# Patient Record
Sex: Female | Born: 1970 | Race: White | Hispanic: No | Marital: Married | Smoking: Former smoker
Health system: Southern US, Community
[De-identification: ages and names within clinical notes are randomized; demographics above are authoritative.]

## PROBLEM LIST (undated history)

## (undated) DIAGNOSIS — T8484XA Pain due to internal orthopedic prosthetic devices, implants and grafts, initial encounter: Secondary | ICD-10-CM

## (undated) DIAGNOSIS — F419 Anxiety disorder, unspecified: Secondary | ICD-10-CM

## (undated) DIAGNOSIS — J189 Pneumonia, unspecified organism: Secondary | ICD-10-CM

## (undated) DIAGNOSIS — T4145XA Adverse effect of unspecified anesthetic, initial encounter: Secondary | ICD-10-CM

## (undated) DIAGNOSIS — Z975 Presence of (intrauterine) contraceptive device: Secondary | ICD-10-CM

## (undated) DIAGNOSIS — R7303 Prediabetes: Secondary | ICD-10-CM

## (undated) DIAGNOSIS — R112 Nausea with vomiting, unspecified: Secondary | ICD-10-CM

## (undated) DIAGNOSIS — Z9889 Other specified postprocedural states: Secondary | ICD-10-CM

## (undated) DIAGNOSIS — T8859XA Other complications of anesthesia, initial encounter: Secondary | ICD-10-CM

## (undated) DIAGNOSIS — J45909 Unspecified asthma, uncomplicated: Secondary | ICD-10-CM

## (undated) DIAGNOSIS — I82409 Acute embolism and thrombosis of unspecified deep veins of unspecified lower extremity: Secondary | ICD-10-CM

## (undated) HISTORY — PX: PILONIDAL CYST EXCISION: SHX744

## (undated) HISTORY — PX: BREAST SURGERY: SHX581

---

## 1998-03-13 ENCOUNTER — Other Ambulatory Visit: Admission: RE | Admit: 1998-03-13 | Discharge: 1998-03-13 | Payer: Self-pay | Admitting: *Deleted

## 1999-04-02 ENCOUNTER — Other Ambulatory Visit: Admission: RE | Admit: 1999-04-02 | Discharge: 1999-04-02 | Payer: Self-pay | Admitting: *Deleted

## 2001-04-21 ENCOUNTER — Other Ambulatory Visit: Admission: RE | Admit: 2001-04-21 | Discharge: 2001-04-21 | Payer: Self-pay | Admitting: *Deleted

## 2002-05-02 ENCOUNTER — Other Ambulatory Visit: Admission: RE | Admit: 2002-05-02 | Discharge: 2002-05-02 | Payer: Self-pay | Admitting: *Deleted

## 2002-09-18 ENCOUNTER — Encounter: Payer: Self-pay | Admitting: *Deleted

## 2002-09-18 ENCOUNTER — Encounter: Admission: RE | Admit: 2002-09-18 | Discharge: 2002-09-18 | Payer: Self-pay | Admitting: *Deleted

## 2003-05-11 ENCOUNTER — Other Ambulatory Visit: Admission: RE | Admit: 2003-05-11 | Discharge: 2003-05-11 | Payer: Self-pay | Admitting: *Deleted

## 2004-07-21 ENCOUNTER — Encounter (INDEPENDENT_AMBULATORY_CARE_PROVIDER_SITE_OTHER): Payer: Self-pay | Admitting: *Deleted

## 2004-07-21 ENCOUNTER — Ambulatory Visit (HOSPITAL_BASED_OUTPATIENT_CLINIC_OR_DEPARTMENT_OTHER): Admission: RE | Admit: 2004-07-21 | Discharge: 2004-07-21 | Payer: Self-pay | Admitting: Plastic Surgery

## 2004-07-21 ENCOUNTER — Ambulatory Visit (HOSPITAL_COMMUNITY): Admission: RE | Admit: 2004-07-21 | Discharge: 2004-07-21 | Payer: Self-pay | Admitting: Plastic Surgery

## 2006-08-09 ENCOUNTER — Encounter: Admission: RE | Admit: 2006-08-09 | Discharge: 2006-08-09 | Payer: Self-pay | Admitting: Internal Medicine

## 2008-08-17 ENCOUNTER — Encounter: Admission: RE | Admit: 2008-08-17 | Discharge: 2008-08-17 | Payer: Self-pay | Admitting: Family Medicine

## 2008-09-18 ENCOUNTER — Encounter: Admission: RE | Admit: 2008-09-18 | Discharge: 2008-09-18 | Payer: Self-pay | Admitting: Gastroenterology

## 2008-10-02 ENCOUNTER — Ambulatory Visit (HOSPITAL_COMMUNITY): Admission: RE | Admit: 2008-10-02 | Discharge: 2008-10-02 | Payer: Self-pay | Admitting: Gastroenterology

## 2009-03-05 IMAGING — US US ABDOMEN COMPLETE
1 series · 14 of 25 positions shown · non-contrast
Comparison: None

CLINICAL DATA: Abdominal pain, particularly in the right upper
quadrant

ABDOMEN ULTRASOUND
TECHNIQUE: Complete abdominal ultrasound examination was performed
including evaluation of the liver, gallbladder, bile ducts,
pancreas, kidneys, spleen, IVC, and abdominal aorta.

[Series 1: us abdomen complete · 0.22mm/px · 14 of 63 slices shown]
[im 1/63]
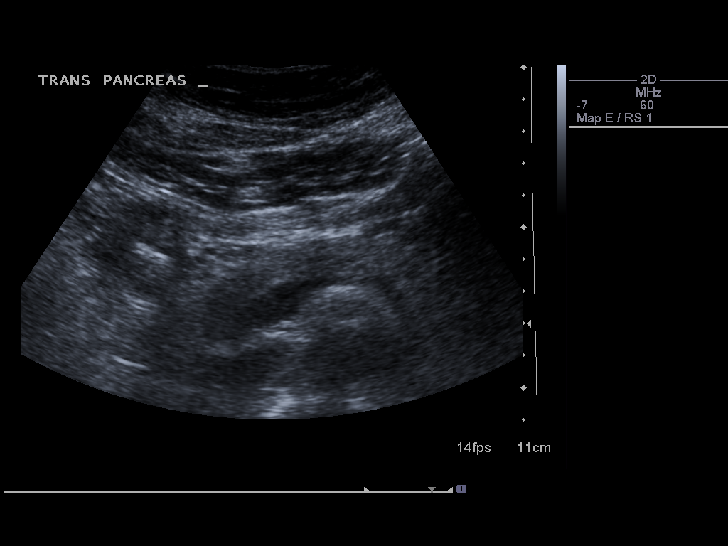
[im 6/63]
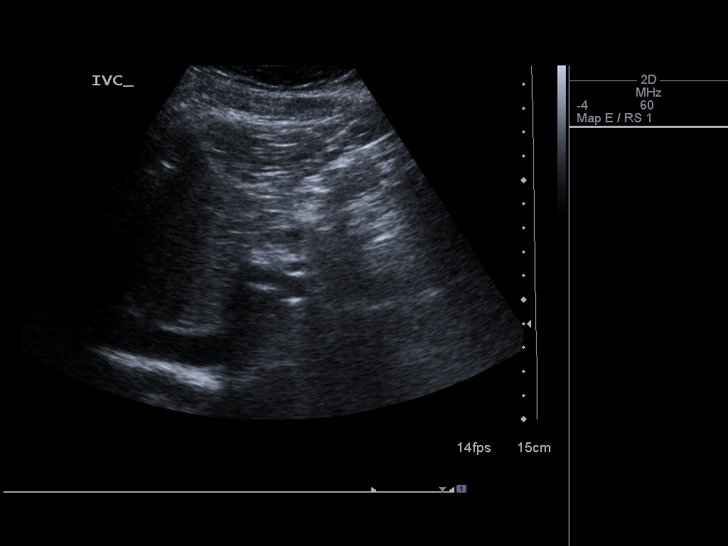
[im 11/63]
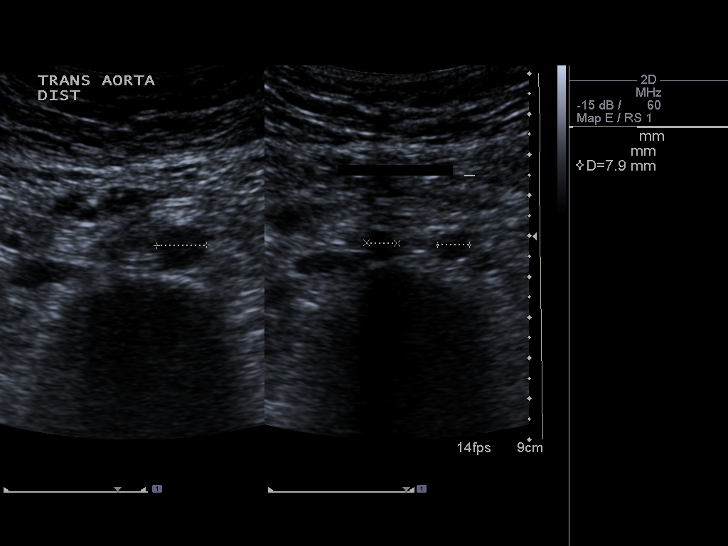
[im 16/63]
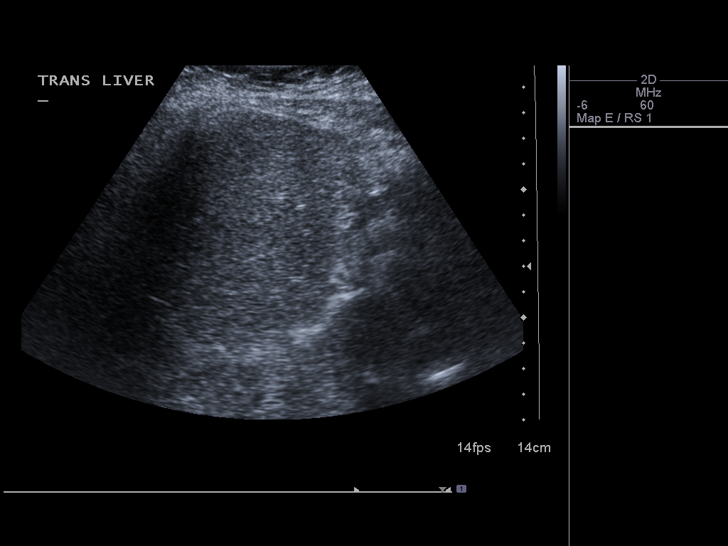
[im 21/63]
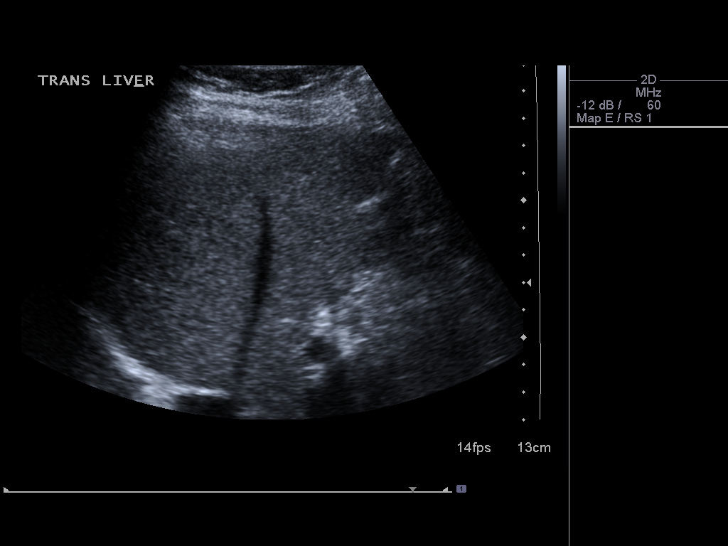
[im 24/63]
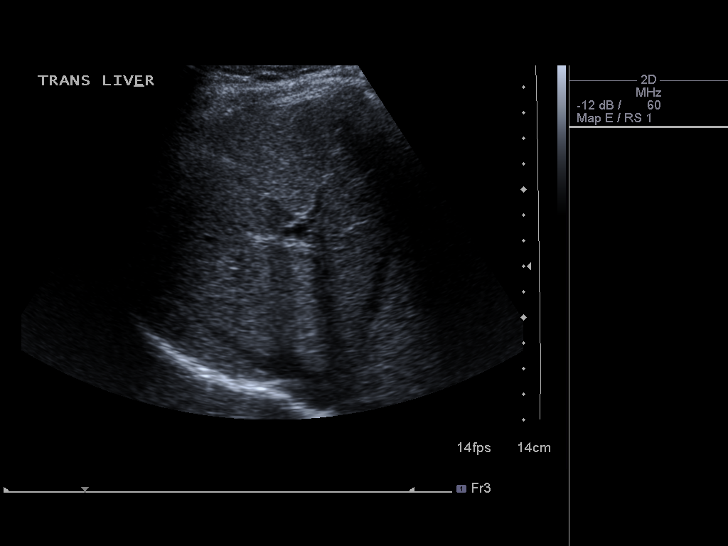
[im 29/63]
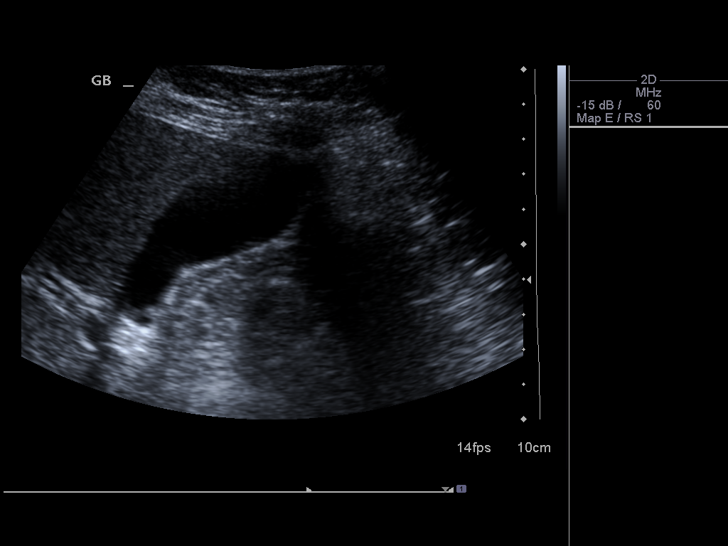
[im 34/63]
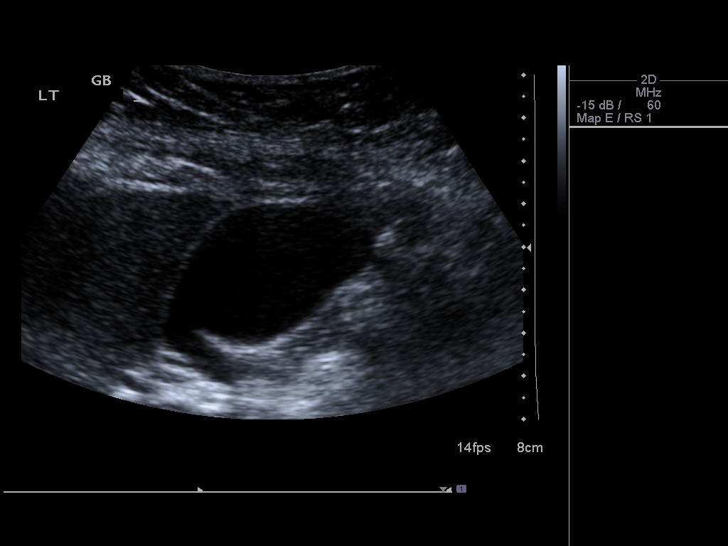
[im 39/63]
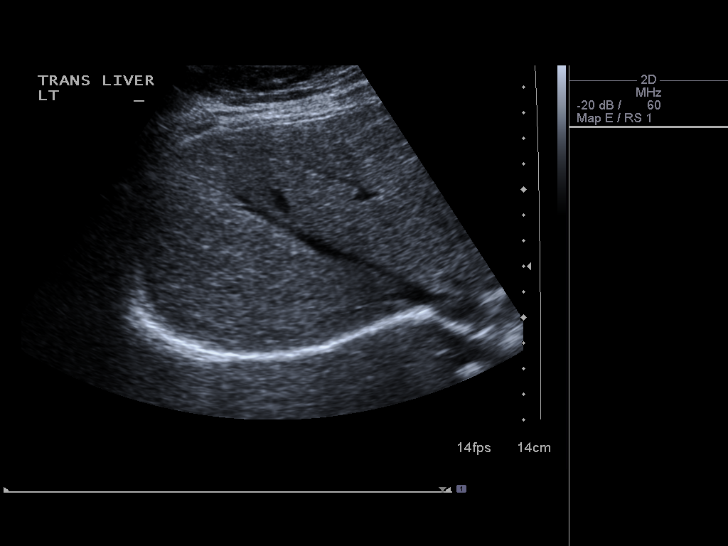
[im 42/63]
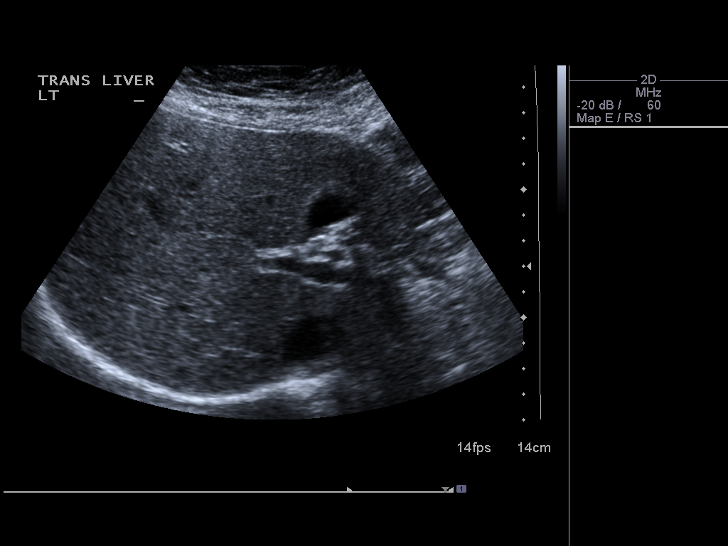
[im 47/63]
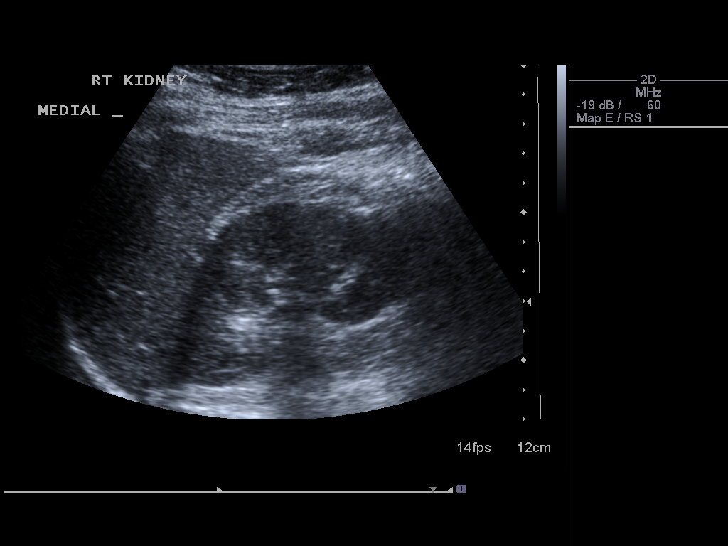
[im 52/63]
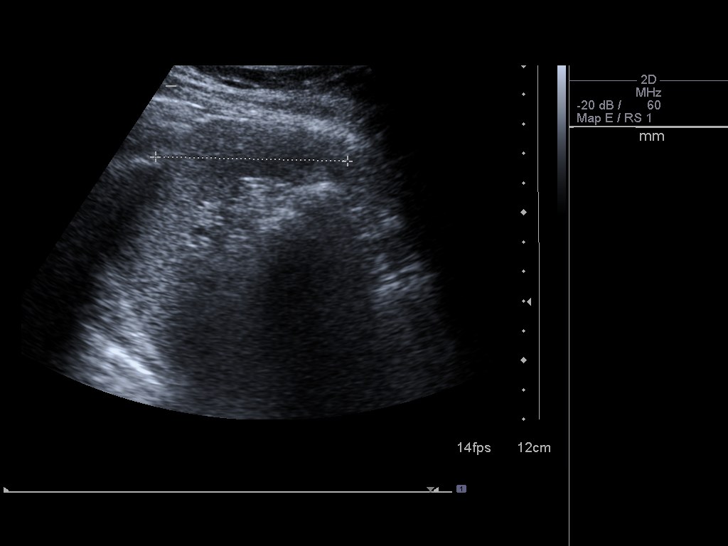
[im 57/63]
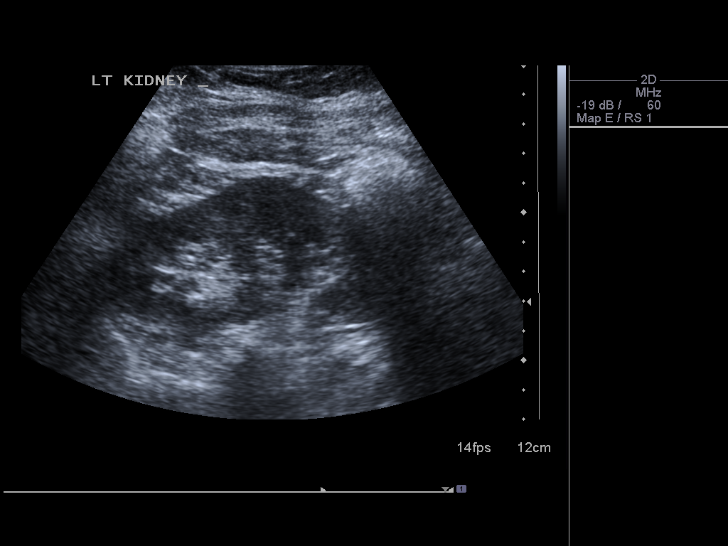
[im 63/63]
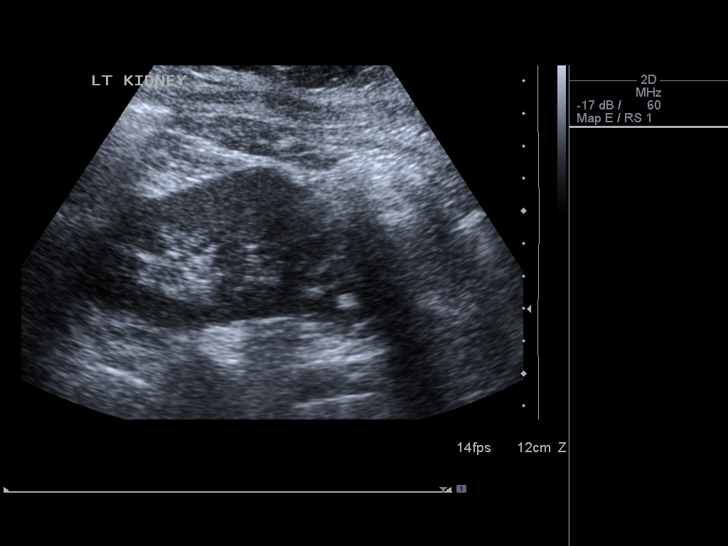

[14 of 25 positions shown; findings below may reference images not displayed]

FINDINGS: The gallbladder is well seen and no gallstones are noted.
The liver has a normal echogenic pattern.  The common bile duct is
normal measuring 3.7 mm in diameter.  The IVC and spleen appear
normal with portions of the head and tail of the pancreas obscured
by bowel gas.  The midportion of the pancreas appears normal and
the pancreatic duct does not appear to be dilated.  No
hydronephrosis is seen.  The right kidney measures 10.3 cm
sagittally, with left kidney measuring 10.2 cm.  A 6 mm echogenic
focus in the lower pole of the left kidney is consistent with a
small renal angiomyolipoma.  The abdominal aorta is normal in
caliber.
IMPRESSION: 1.  No gallstones.  No ductal dilatation.
2.  Portions of the pancreas are obscured by bowel gas.
3.  Small left lower pole renal angiomyolipoma of 6 mm in diameter.

## 2011-03-13 NOTE — Op Note (Signed)
NAMEJASLENE, [name] Carr              ACCOUNT NO.:  [id number]   MEDICAL RECORD NO.:  [id number]          PATIENT TYPE:  AMB   LOCATION:  [facility]                          FACILITY:  [facility]   PHYSICIAN:  Etter Sjogren, M.D.     DATE OF BIRTH:  04/26/1971   DATE OF PROCEDURE:  07/21/2004  DATE OF DISCHARGE:                                 OPERATIVE REPORT   PREOPERATIVE DIAGNOSIS:  Bilateral macromastia with upper back, neck and  shoulder pain.   POSTOPERATIVE DIAGNOSIS:  Bilateral macromastia with upper back, neck and  shoulder pain.   OPERATION PERFORMED:  Bilateral breast reduction.   SURGEON:  Etter Sjogren, M.D.   ANESTHESIA:  General.   ESTIMATED BLOOD LOSS:  125 mL.   INDICATIONS FOR PROCEDURE:  This 40 year old woman has large breasts and  desires reduction.  She does not qualify on her insurance because it was a  very small reduction.  The nature of the procedure, its indications and  risks and possible complications were discussed in great detail with her  including the informed consent form which was discussed line by line.  She  understood those risks and wished to proceed.   DESCRIPTION OF PROCEDURE:  The patient was marked in full standing position  in the holding area for the breast reduction.  She was take to the operating  room and placed supine.  After successful induction with general anesthesia,  she was prepped with Betadine and draped with sterile drapes.  8 cm wide  inferior nipple areolar pedicles were marked and 42 mm marker used to mark  the new nipple areolar complex.  All incisions made and the inferior nipple  areolar pedicle was de-epithelialized.  The inferior nipple areolar pedicles  were isolated from surrounding tissue beveling in an outward direction both  medially and laterally in order to ensure broader attachment at the level of  the chest wall that was present in the skin.  This dissection was performed  with the electrocautery and meticulous  hemostasis maintained with  electrocautery. The resections were performed from medial to central to  lateral.  A total of 222 g from the smaller left side and 344 g from the  larger right side.  This seemed to give her very nice symmetry on  inspection.  Thorough irrigation with saline meticulous hemostasis with  electrocautery.  Closure with 3-0 Monocryl interrupted inverted deep  sutures, 4-0 Monocryl running subcuticular suture.  A measurement was then  taken 5 cm up from the inframammary crease.  A 38 mm marker used to mark the  new sites of the nipple areolar complex.  This tissue was resected.  Nipple  areolar complex was brought up through this opening.  They were both  inspected.  They were found to have very good pink color with bright red  bleeding to periphery consistent with viability.  The nipple areolar  insetting 3-0 Monocryl interrupted inverted deep sutures, 4-0 Monocryl  running  subcuticular suture.  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  She was transported to the recovery room in stable condition  having tolerated the procedure well.       DB/MEDQ  D:  07/21/2004  T:  07/21/2004  Job:  [id number]

## 2012-12-13 ENCOUNTER — Emergency Department (HOSPITAL_BASED_OUTPATIENT_CLINIC_OR_DEPARTMENT_OTHER): Payer: BC Managed Care – PPO

## 2012-12-13 ENCOUNTER — Encounter (HOSPITAL_BASED_OUTPATIENT_CLINIC_OR_DEPARTMENT_OTHER): Payer: Self-pay | Admitting: *Deleted

## 2012-12-13 ENCOUNTER — Emergency Department (HOSPITAL_BASED_OUTPATIENT_CLINIC_OR_DEPARTMENT_OTHER)
Admission: EM | Admit: 2012-12-13 | Discharge: 2012-12-13 | Disposition: A | Discharge status: Home / Self Care | Payer: BC Managed Care – PPO | Attending: Emergency Medicine | Admitting: Emergency Medicine

## 2012-12-13 DIAGNOSIS — R002 Palpitations: Secondary | ICD-10-CM | POA: Insufficient documentation

## 2012-12-13 DIAGNOSIS — M79609 Pain in unspecified limb: Secondary | ICD-10-CM | POA: Insufficient documentation

## 2012-12-13 DIAGNOSIS — Z87891 Personal history of nicotine dependence: Secondary | ICD-10-CM | POA: Insufficient documentation

## 2012-12-13 DIAGNOSIS — F430 Acute stress reaction: Secondary | ICD-10-CM | POA: Insufficient documentation

## 2012-12-13 DIAGNOSIS — Z79899 Other long term (current) drug therapy: Secondary | ICD-10-CM | POA: Insufficient documentation

## 2012-12-13 DIAGNOSIS — R079 Chest pain, unspecified: Secondary | ICD-10-CM | POA: Insufficient documentation

## 2012-12-13 DIAGNOSIS — M79606 Pain in leg, unspecified: Secondary | ICD-10-CM

## 2012-12-13 DIAGNOSIS — M7989 Other specified soft tissue disorders: Secondary | ICD-10-CM | POA: Insufficient documentation

## 2012-12-13 DIAGNOSIS — E119 Type 2 diabetes mellitus without complications: Secondary | ICD-10-CM | POA: Insufficient documentation

## 2012-12-13 DIAGNOSIS — J45901 Unspecified asthma with (acute) exacerbation: Secondary | ICD-10-CM | POA: Insufficient documentation

## 2012-12-13 DIAGNOSIS — F411 Generalized anxiety disorder: Secondary | ICD-10-CM | POA: Insufficient documentation

## 2012-12-13 HISTORY — DX: Anxiety disorder, unspecified: F41.9

## 2012-12-13 HISTORY — DX: Unspecified asthma, uncomplicated: J45.909

## 2012-12-13 MED ORDER — ENOXAPARIN SODIUM 60 MG/0.6ML ~~LOC~~ SOLN
1.0000 mg/kg | Freq: Once | SUBCUTANEOUS | Status: AC
  Administered 2012-12-13: 60 mg via SUBCUTANEOUS
  Filled 2012-12-13: qty 0.6

## 2012-12-13 NOTE — ED Notes (Signed)
Pain behind her left knee for a while. Sob for a while. Feels like she is having heart palpitations. She is being treated for anxiety.

## 2012-12-13 NOTE — ED Provider Notes (Addendum)
History     CSN: [id number]  Arrival date & time 12/13/12  2040   First MD Initiated Contact with Patient 12/13/12 2129      Chief Complaint  Patient presents with  . Leg Pain    (Consider location/radiation/quality/duration/timing/severity/associated sxs/prior treatment) Patient is a 42 y.o. female presenting with leg pain and palpitations. The history is provided by the patient.  Leg Pain Location:  Leg Time since incident:  2 weeks Injury: no   Leg location:  R leg Pain details:    Quality:  Aching   Radiates to:  Does not radiate   Severity:  Mild   Onset quality:  Gradual   Timing:  Intermittent   Progression:  Unchanged Chronicity:  New Worsened by:  Nothing tried Associated symptoms: no fever   Associated symptoms comment:  Has noticed some swelling to the thigh and calf Palpitations  This is a recurrent problem. The current episode started yesterday. The problem occurs daily. The problem has not changed since onset.The problem is associated with anxiety, stress and caffeine. Associated symptoms include leg pain. Pertinent negatives include no fever, no exertional chest pressure, no irregular heartbeat, no abdominal pain, no weakness and no cough. Associated symptoms comments: With running outside will get some chest tightness and wheezing which resolves with inhaler. She has tried breathing exercises for the symptoms. The treatment provided mild relief. Risk factors include diabetes mellitus and stress. Her past medical history does not include anemia or heart disease.    Past Medical History  Diagnosis Date  . Anxiety   . Diabetes mellitus without complication   . Asthma     Past Surgical History  Procedure Laterality Date  . Breast surgery      No family history on file.  History  Substance Use Topics  . Smoking status: Former Games developer  . Smokeless tobacco: Not on file  . Alcohol Use: Yes    OB History   Grav Para Term Preterm Abortions TAB SAB Ect  Mult Living                  Review of Systems  Constitutional: Negative for fever.  Respiratory: Negative for cough.   Cardiovascular: Positive for palpitations.  Gastrointestinal: Negative for abdominal pain.  Neurological: Negative for weakness.  All other systems reviewed and are negative.    Allergies  Latex and Tetracyclines & related  Home Medications   Current Outpatient Rx  Name  Route  Sig  Dispense  Refill  . ALBUTEROL IN   Inhalation   Inhale into the lungs.         Marland Kitchen buPROPion (WELLBUTRIN XL) 150 MG 24 hr tablet   Oral   Take 150 mg by mouth daily.         . busPIRone (BUSPAR) 7.5 MG tablet   Oral   Take 7.5 mg by mouth 3 (three) times daily.         . metFORMIN (GLUCOPHAGE) 1000 MG tablet   Oral   Take 1,000 mg by mouth 2 (two) times daily with a meal.         . Pediatric Multivitamins-Fl (MULTI VIT/FL PO)   Oral   Take by mouth.         . Vitamin D, Ergocalciferol, (DRISDOL) 50000 UNITS CAPS   Oral   Take 50,000 Units by mouth.           BP 146/96  Pulse 123  Temp(Src) 98 F (36.7 C) (Oral)  Resp  24  Wt 134 lb (60.782 kg)  SpO2 100%  LMP 12/06/2012  Physical Exam  Nursing note and vitals reviewed. Constitutional: She is oriented to person, place, and time. She appears well-developed and well-nourished. No distress.  HENT:  Head: Normocephalic and atraumatic.  Mouth/Throat: Oropharynx is clear and moist.  Eyes: Conjunctivae and EOM are normal. Pupils are equal, round, and reactive to light.  Neck: Normal range of motion. Neck supple.  Cardiovascular: Normal rate, regular rhythm and intact distal pulses.   No murmur heard. Pulmonary/Chest: Effort normal and breath sounds normal. No respiratory distress. She has no wheezes. She has no rales.  Abdominal: Soft. She exhibits no distension. There is no tenderness. There is no rebound and no guarding.  Musculoskeletal: Normal range of motion. She exhibits no edema and no  tenderness.  No notable swelling.  2+ DP/PT pulses   Neurological: She is alert and oriented to person, place, and time.  Skin: Skin is warm and dry. No rash noted. No erythema.  Psychiatric: Her behavior is normal. Her mood appears anxious.    ED Course  Procedures (including critical care time)  Labs Reviewed - No data to display Dg Chest 2 View  12/13/2012  *RADIOLOGY REPORT*  Clinical Data: Palpitations  CHEST - 2 VIEW  Comparison: Prior chest x-ray 10/17/2008  Findings: The lungs are well-aerated and free from pulmonary edema, focal airspace consolidation or pulmonary nodule.  Cardiac and mediastinal contours are within normal limits.  No pneumothorax, or pleural effusion. No acute osseous findings.  IMPRESSION:  No acute cardiopulmonary disease.   Original Report Authenticated By: Malachy Moan, M.D.      Date: 12/13/2012  Rate: 95  Rhythm: normal sinus rhythm and sinus arrhythmia  QRS Axis: normal  Intervals: normal  ST/T Wave abnormalities: normal  Conduction Disutrbances:none  Narrative Interpretation:   Old EKG Reviewed: none available    1. Leg pain   2. Palpitation       MDM   Patient is here for 2 separate issues.  She is here for intermittent right leg pain with mild swelling that has been off-and-on for the last 2 weeks. She states it is not painful with running or movement but is painful with sitting. She takes no hormone replacement, no recent surgery or travel however her mom has a history of DVT.  Patient denies any sharp chest pain or shortness of breath.  Per the patient warrants then Doppler ultrasound in the morning. She will be given a dose of Lovenox and follow up for ultrasound.  Secondly patient is complaining of anxiety, stress intermittent palpitations and shortness of breath. She states she has exercise-induced asthma as well as allergies. When she is running and it is cold she starts wheezing when she is finished and requires albuterol which  improves. She denies any pleuritic pain at this time but states she occasionally would just feel congested. She has no prior cardiac history and again does have a history of asthma. Her EKG is within normal limits and shows sinus arrhythmia. X-ray is also unrevealing. Discussed with patient that this is probably a combination of her stress as well as asthma and allergies. She will use her inhaler when necessary and continue to take her antidepressant and antianxiety medication. She'll followup with her doctor for this        Gwyneth Sprout, MD 12/13/12 6295  Gwyneth Sprout, MD 12/13/12 2312

## 2012-12-13 NOTE — ED Notes (Signed)
Pt. Reports high anxiety and stress in her life.  Pt. Talks non stop about her problems.

## 2012-12-14 ENCOUNTER — Ambulatory Visit (HOSPITAL_BASED_OUTPATIENT_CLINIC_OR_DEPARTMENT_OTHER)
Admit: 2012-12-14 | Discharge: 2012-12-14 | Disposition: A | Discharge status: Home / Self Care | Payer: BC Managed Care – PPO | Attending: Emergency Medicine | Admitting: Emergency Medicine

## 2012-12-14 DIAGNOSIS — R209 Unspecified disturbances of skin sensation: Secondary | ICD-10-CM | POA: Insufficient documentation

## 2016-10-26 ENCOUNTER — Emergency Department (HOSPITAL_COMMUNITY): Payer: BLUE CROSS/BLUE SHIELD

## 2016-10-26 ENCOUNTER — Inpatient Hospital Stay (HOSPITAL_COMMUNITY)
Admission: EM | Admit: 2016-10-26 | Discharge: 2016-10-28 | DRG: 502 | Disposition: A | Discharge status: Home / Self Care | Payer: BLUE CROSS/BLUE SHIELD | Attending: Orthopaedic Surgery | Admitting: Orthopaedic Surgery

## 2016-10-26 ENCOUNTER — Encounter (HOSPITAL_COMMUNITY)
Admission: EM | Disposition: A | Discharge status: Home / Self Care | Payer: Self-pay | Source: Home / Self Care | Attending: Orthopaedic Surgery

## 2016-10-26 ENCOUNTER — Encounter (HOSPITAL_COMMUNITY): Payer: Self-pay

## 2016-10-26 ENCOUNTER — Emergency Department (HOSPITAL_COMMUNITY): Payer: BLUE CROSS/BLUE SHIELD | Admitting: Anesthesiology

## 2016-10-26 DIAGNOSIS — E119 Type 2 diabetes mellitus without complications: Secondary | ICD-10-CM | POA: Diagnosis present

## 2016-10-26 DIAGNOSIS — W010XXA Fall on same level from slipping, tripping and stumbling without subsequent striking against object, initial encounter: Secondary | ICD-10-CM | POA: Diagnosis present

## 2016-10-26 DIAGNOSIS — S82202B Unspecified fracture of shaft of left tibia, initial encounter for open fracture type I or II: Secondary | ICD-10-CM | POA: Diagnosis not present

## 2016-10-26 DIAGNOSIS — Z91040 Latex allergy status: Secondary | ICD-10-CM

## 2016-10-26 DIAGNOSIS — S82402A Unspecified fracture of shaft of left fibula, initial encounter for closed fracture: Secondary | ICD-10-CM

## 2016-10-26 DIAGNOSIS — J45909 Unspecified asthma, uncomplicated: Secondary | ICD-10-CM | POA: Diagnosis present

## 2016-10-26 DIAGNOSIS — S82202A Unspecified fracture of shaft of left tibia, initial encounter for closed fracture: Secondary | ICD-10-CM

## 2016-10-26 DIAGNOSIS — S82842B Displaced bimalleolar fracture of left lower leg, initial encounter for open fracture type I or II: Secondary | ICD-10-CM | POA: Diagnosis present

## 2016-10-26 DIAGNOSIS — Z87891 Personal history of nicotine dependence: Secondary | ICD-10-CM | POA: Diagnosis not present

## 2016-10-26 DIAGNOSIS — S82899A Other fracture of unspecified lower leg, initial encounter for closed fracture: Secondary | ICD-10-CM

## 2016-10-26 DIAGNOSIS — F419 Anxiety disorder, unspecified: Secondary | ICD-10-CM | POA: Diagnosis present

## 2016-10-26 DIAGNOSIS — Z888 Allergy status to other drugs, medicaments and biological substances status: Secondary | ICD-10-CM | POA: Diagnosis not present

## 2016-10-26 DIAGNOSIS — S82402B Unspecified fracture of shaft of left fibula, initial encounter for open fracture type I or II: Secondary | ICD-10-CM

## 2016-10-26 HISTORY — PX: EXTERNAL FIXATION LEG: SHX1549

## 2016-10-26 HISTORY — PX: FRACTURE SURGERY: SHX138

## 2016-10-26 HISTORY — PX: IRRIGATION AND DEBRIDEMENT FOOT: SHX6602

## 2016-10-26 LAB — GLUCOSE, CAPILLARY
GLUCOSE-CAPILLARY: 134 mg/dL — AB (ref 65–99)
GLUCOSE-CAPILLARY: 133 mg/dL — AB (ref 65–99)
Glucose-Capillary: 140 mg/dL — ABNORMAL HIGH (ref 65–99)

## 2016-10-26 LAB — CBC WITH DIFFERENTIAL/PLATELET
BASOS PCT: 0 %
Basophils Absolute: 0 10*3/uL (ref 0.0–0.1)
EOS ABS: 0.1 10*3/uL (ref 0.0–0.7)
EOS PCT: 1 %
HCT: 38.2 % (ref 36.0–46.0)
Hemoglobin: 13.1 g/dL (ref 12.0–15.0)
Lymphocytes Relative: 9 %
Lymphs Abs: 1.8 10*3/uL (ref 0.7–4.0)
MCH: 32.1 pg (ref 26.0–34.0)
MCHC: 34.3 g/dL (ref 30.0–36.0)
MCV: 93.6 fL (ref 78.0–100.0)
MONO ABS: 1.5 10*3/uL — AB (ref 0.1–1.0)
MONOS PCT: 8 %
Neutro Abs: 16.3 10*3/uL — ABNORMAL HIGH (ref 1.7–7.7)
Neutrophils Relative %: 82 %
Platelets: 389 10*3/uL (ref 150–400)
RBC: 4.08 MIL/uL (ref 3.87–5.11)
RDW: 13.6 % (ref 11.5–15.5)
WBC: 19.8 10*3/uL — ABNORMAL HIGH (ref 4.0–10.5)

## 2016-10-26 LAB — COMPREHENSIVE METABOLIC PANEL
ALT: 24 U/L (ref 14–54)
AST: 17 U/L (ref 15–41)
Albumin: 4.3 g/dL (ref 3.5–5.0)
Alkaline Phosphatase: 68 U/L (ref 38–126)
Anion gap: 11 (ref 5–15)
BUN: 15 mg/dL (ref 6–20)
CHLORIDE: 104 mmol/L (ref 101–111)
CO2: 23 mmol/L (ref 22–32)
CREATININE: 0.7 mg/dL (ref 0.44–1.00)
Calcium: 8.4 mg/dL — ABNORMAL LOW (ref 8.9–10.3)
Glucose, Bld: 156 mg/dL — ABNORMAL HIGH (ref 65–99)
POTASSIUM: 4.1 mmol/L (ref 3.5–5.1)
Sodium: 138 mmol/L (ref 135–145)
Total Bilirubin: <0.1 mg/dL — ABNORMAL LOW (ref 0.3–1.2)
Total Protein: 7.1 g/dL (ref 6.5–8.1)

## 2016-10-26 LAB — ABO/RH: ABO/RH(D): A POS

## 2016-10-26 LAB — PROTIME-INR
INR: 0.94
PROTHROMBIN TIME: 12.6 s (ref 11.4–15.2)

## 2016-10-26 LAB — TYPE AND SCREEN
ABO/RH(D): A POS
ANTIBODY SCREEN: NEGATIVE

## 2016-10-26 SURGERY — EXTERNAL FIXATION, LOWER EXTREMITY
Anesthesia: General | Site: Ankle | Laterality: Left

## 2016-10-26 MED ORDER — PROPOFOL 10 MG/ML IV BOLUS
INTRAVENOUS | Status: AC
  Filled 2016-10-26: qty 20

## 2016-10-26 MED ORDER — KETAMINE HCL-SODIUM CHLORIDE 100-0.9 MG/10ML-% IV SOSY
0.3000 mg/kg | PREFILLED_SYRINGE | Freq: Once | INTRAVENOUS | Status: AC
Start: 2016-10-26 — End: 2016-10-26
  Administered 2016-10-26: 18 mg via INTRAVENOUS
  Filled 2016-10-26: qty 10

## 2016-10-26 MED ORDER — SODIUM CHLORIDE 0.9 % IR SOLN
Status: DC | PRN
  Administered 2016-10-26: 3000 mL

## 2016-10-26 MED ORDER — HYDROMORPHONE HCL 1 MG/ML IJ SOLN
INTRAMUSCULAR | Status: DC | PRN
  Administered 2016-10-26 (×3): 0.5 mg via INTRAVENOUS

## 2016-10-26 MED ORDER — HYDROMORPHONE HCL 1 MG/ML IJ SOLN
1.0000 mg | INTRAMUSCULAR | Status: DC | PRN
  Administered 2016-10-26 – 2016-10-27 (×3): 1 mg via INTRAVENOUS
  Filled 2016-10-26 (×4): qty 1

## 2016-10-26 MED ORDER — ACETAMINOPHEN 650 MG RE SUPP
650.0000 mg | Freq: Four times a day (QID) | RECTAL | Status: DC | PRN
Start: 2016-10-26 — End: 2016-10-28

## 2016-10-26 MED ORDER — METHOCARBAMOL 500 MG PO TABS
500.0000 mg | ORAL_TABLET | Freq: Four times a day (QID) | ORAL | Status: DC | PRN
  Administered 2016-10-26 – 2016-10-28 (×6): 500 mg via ORAL
  Filled 2016-10-26 (×6): qty 1

## 2016-10-26 MED ORDER — CEFAZOLIN IN D5W 1 GM/50ML IV SOLN
1.0000 g | Freq: Three times a day (TID) | INTRAVENOUS | Status: AC
  Administered 2016-10-26 – 2016-10-28 (×6): 1 g via INTRAVENOUS
  Filled 2016-10-26 (×7): qty 50

## 2016-10-26 MED ORDER — SODIUM CHLORIDE 0.9 % IV BOLUS (SEPSIS)
1000.0000 mL | Freq: Once | INTRAVENOUS | Status: AC
  Administered 2016-10-26: 1000 mL via INTRAVENOUS

## 2016-10-26 MED ORDER — FENTANYL CITRATE (PF) 100 MCG/2ML IJ SOLN
INTRAMUSCULAR | Status: DC | PRN
  Administered 2016-10-26: 100 ug via INTRAVENOUS

## 2016-10-26 MED ORDER — HYDROMORPHONE HCL 1 MG/ML IJ SOLN: 1.0000 mg | Freq: Once | INTRAMUSCULAR | Status: DC

## 2016-10-26 MED ORDER — ONDANSETRON HCL 4 MG/2ML IJ SOLN
INTRAMUSCULAR | Status: DC | PRN
  Administered 2016-10-26: 4 mg via INTRAVENOUS

## 2016-10-26 MED ORDER — CEFAZOLIN SODIUM-DEXTROSE 2-4 GM/100ML-% IV SOLN
2.0000 g | Freq: Once | INTRAVENOUS | Status: AC
  Administered 2016-10-26: 2 g via INTRAVENOUS

## 2016-10-26 MED ORDER — ROPIVACAINE HCL 7.5 MG/ML IJ SOLN
INTRAMUSCULAR | Status: DC | PRN
  Administered 2016-10-26: 20 mL via PERINEURAL
  Administered 2016-10-26: 10 mL via PERINEURAL

## 2016-10-26 MED ORDER — METOCLOPRAMIDE HCL 5 MG/ML IJ SOLN: 5.0000 mg | Freq: Three times a day (TID) | INTRAMUSCULAR | Status: DC | PRN

## 2016-10-26 MED ORDER — ACETAMINOPHEN 325 MG PO TABS: 650.0000 mg | ORAL_TABLET | Freq: Four times a day (QID) | ORAL | Status: DC | PRN

## 2016-10-26 MED ORDER — METHOCARBAMOL 1000 MG/10ML IJ SOLN
500.0000 mg | Freq: Four times a day (QID) | INTRAVENOUS | Status: DC | PRN
  Administered 2016-10-26: 500 mg via INTRAVENOUS
  Filled 2016-10-26: qty 550
  Filled 2016-10-26: qty 5

## 2016-10-26 MED ORDER — ONDANSETRON HCL 4 MG/2ML IJ SOLN
INTRAMUSCULAR | Status: AC
  Filled 2016-10-26: qty 2

## 2016-10-26 MED ORDER — PROPOFOL 10 MG/ML IV BOLUS
INTRAVENOUS | Status: DC | PRN
  Administered 2016-10-26: 150 mg via INTRAVENOUS

## 2016-10-26 MED ORDER — FENTANYL CITRATE (PF) 100 MCG/2ML IJ SOLN
INTRAMUSCULAR | Status: AC
  Filled 2016-10-26: qty 2

## 2016-10-26 MED ORDER — BUPIVACAINE HCL (PF) 0.5 % IJ SOLN
INTRAMUSCULAR | Status: DC | PRN
  Administered 2016-10-26: 5 mL via PERINEURAL
  Administered 2016-10-26: 10 mL via PERINEURAL

## 2016-10-26 MED ORDER — SUCCINYLCHOLINE CHLORIDE 200 MG/10ML IV SOSY
PREFILLED_SYRINGE | INTRAVENOUS | Status: AC
Start: 2016-10-26 — End: 2016-10-26
  Filled 2016-10-26: qty 10

## 2016-10-26 MED ORDER — ALPRAZOLAM 0.5 MG PO TABS: 0.5000 mg | ORAL_TABLET | Freq: Two times a day (BID) | ORAL | Status: DC | PRN

## 2016-10-26 MED ORDER — DIPHENHYDRAMINE HCL 12.5 MG/5ML PO ELIX: 12.5000 mg | ORAL_SOLUTION | ORAL | Status: DC | PRN

## 2016-10-26 MED ORDER — ONDANSETRON HCL 4 MG PO TABS: 4.0000 mg | ORAL_TABLET | Freq: Four times a day (QID) | ORAL | Status: DC | PRN

## 2016-10-26 MED ORDER — CITALOPRAM HYDROBROMIDE 20 MG PO TABS
20.0000 mg | ORAL_TABLET | Freq: Every day | ORAL | Status: DC
  Administered 2016-10-26 – 2016-10-27 (×2): 20 mg via ORAL
  Filled 2016-10-26 (×4): qty 1

## 2016-10-26 MED ORDER — MIDAZOLAM HCL 2 MG/2ML IJ SOLN
INTRAMUSCULAR | Status: AC
Start: 2016-10-26 — End: 2016-10-26
  Filled 2016-10-26: qty 2

## 2016-10-26 MED ORDER — HYDROMORPHONE HCL 2 MG/ML IJ SOLN
INTRAMUSCULAR | Status: AC
  Filled 2016-10-26: qty 1

## 2016-10-26 MED ORDER — LIDOCAINE 2% (20 MG/ML) 5 ML SYRINGE
INTRAMUSCULAR | Status: DC | PRN
  Administered 2016-10-26: 50 mg via INTRAVENOUS

## 2016-10-26 MED ORDER — ONDANSETRON HCL 4 MG/2ML IJ SOLN: 4.0000 mg | Freq: Four times a day (QID) | INTRAMUSCULAR | Status: DC | PRN

## 2016-10-26 MED ORDER — SODIUM CHLORIDE 0.9 % IV SOLN
INTRAVENOUS | Status: DC
  Administered 2016-10-26 – 2016-10-27 (×2): via INTRAVENOUS

## 2016-10-26 MED ORDER — MIDAZOLAM HCL 5 MG/5ML IJ SOLN
INTRAMUSCULAR | Status: DC | PRN
  Administered 2016-10-26: 2 mg via INTRAVENOUS

## 2016-10-26 MED ORDER — BUPIVACAINE HCL (PF) 0.5 % IJ SOLN
INTRAMUSCULAR | Status: AC
  Filled 2016-10-26: qty 30

## 2016-10-26 MED ORDER — 0.9 % SODIUM CHLORIDE (POUR BTL) OPTIME
TOPICAL | Status: DC | PRN
  Administered 2016-10-26: 1000 mL

## 2016-10-26 MED ORDER — SUCCINYLCHOLINE CHLORIDE 200 MG/10ML IV SOSY
PREFILLED_SYRINGE | INTRAVENOUS | Status: DC | PRN
  Administered 2016-10-26: 120 mg via INTRAVENOUS

## 2016-10-26 MED ORDER — KETOROLAC TROMETHAMINE 15 MG/ML IJ SOLN
7.5000 mg | Freq: Four times a day (QID) | INTRAMUSCULAR | Status: AC
  Administered 2016-10-26 – 2016-10-27 (×4): 7.5 mg via INTRAVENOUS
  Filled 2016-10-26 (×4): qty 1

## 2016-10-26 MED ORDER — OXYCODONE HCL 5 MG PO TABS: 5.0000 mg | ORAL_TABLET | Freq: Once | ORAL | Status: DC | PRN

## 2016-10-26 MED ORDER — HYDROCODONE-ACETAMINOPHEN 5-325 MG PO TABS
1.0000 | ORAL_TABLET | ORAL | Status: DC | PRN
  Administered 2016-10-26 – 2016-10-28 (×10): 2 via ORAL
  Filled 2016-10-26 (×10): qty 2

## 2016-10-26 MED ORDER — FENTANYL CITRATE (PF) 100 MCG/2ML IJ SOLN
50.0000 ug | Freq: Once | INTRAMUSCULAR | Status: AC
Start: 2016-10-26 — End: 2016-10-26
  Administered 2016-10-26: 50 ug via INTRAVENOUS
  Filled 2016-10-26: qty 2

## 2016-10-26 MED ORDER — ROPIVACAINE HCL 7.5 MG/ML IJ SOLN
INTRAMUSCULAR | Status: AC
  Filled 2016-10-26: qty 40

## 2016-10-26 MED ORDER — OXYCODONE HCL 5 MG/5ML PO SOLN: 5.0000 mg | Freq: Once | ORAL | Status: DC | PRN

## 2016-10-26 MED ORDER — CEFAZOLIN SODIUM-DEXTROSE 2-4 GM/100ML-% IV SOLN
INTRAVENOUS | Status: AC
  Filled 2016-10-26: qty 100

## 2016-10-26 MED ORDER — HYDROMORPHONE HCL 1 MG/ML IJ SOLN: 0.2500 mg | INTRAMUSCULAR | Status: DC | PRN

## 2016-10-26 MED ORDER — MONTELUKAST SODIUM 10 MG PO TABS
10.0000 mg | ORAL_TABLET | Freq: Every day | ORAL | Status: DC
  Administered 2016-10-26 – 2016-10-27 (×2): 10 mg via ORAL
  Filled 2016-10-26 (×2): qty 1

## 2016-10-26 MED ORDER — METOCLOPRAMIDE HCL 5 MG PO TABS: 5.0000 mg | ORAL_TABLET | Freq: Three times a day (TID) | ORAL | Status: DC | PRN

## 2016-10-26 MED ORDER — LIDOCAINE 2% (20 MG/ML) 5 ML SYRINGE
INTRAMUSCULAR | Status: AC
  Filled 2016-10-26: qty 5

## 2016-10-26 MED ORDER — DOCUSATE SODIUM 100 MG PO CAPS
100.0000 mg | ORAL_CAPSULE | Freq: Two times a day (BID) | ORAL | Status: DC
  Administered 2016-10-26 – 2016-10-28 (×4): 100 mg via ORAL
  Filled 2016-10-26 (×4): qty 1

## 2016-10-26 MED ORDER — LACTATED RINGERS IV SOLN
INTRAVENOUS | Status: DC | PRN
  Administered 2016-10-26: 08:00:00 via INTRAVENOUS

## 2016-10-26 MED ORDER — OXYCODONE HCL 5 MG PO TABS
5.0000 mg | ORAL_TABLET | ORAL | Status: DC | PRN
  Administered 2016-10-27 – 2016-10-28 (×6): 10 mg via ORAL
  Filled 2016-10-26 (×6): qty 2

## 2016-10-26 SURGICAL SUPPLY — 43 items
BAG SPEC THK2 15X12 ZIP CLS (MISCELLANEOUS) ×2
BAG ZIPLOCK 12X15 (MISCELLANEOUS) ×4 IMPLANT
BANDAGE ACE 4X5 VEL STRL LF (GAUZE/BANDAGES/DRESSINGS) ×3 IMPLANT
BANDAGE ESMARK 6X9 LF (GAUZE/BANDAGES/DRESSINGS) ×2 IMPLANT
BAR EXFX 350X11 NS LF (EXFIX) ×4
BAR GLASS FIBER EXFX 11X350 (EXFIX) ×6 IMPLANT
BNDG CMPR 9X6 STRL LF SNTH (GAUZE/BANDAGES/DRESSINGS) ×2
BNDG ESMARK 6X9 LF (GAUZE/BANDAGES/DRESSINGS) ×4
BNDG GAUZE ELAST 4 BULKY (GAUZE/BANDAGES/DRESSINGS) ×4 IMPLANT
CLAMP BLUE BAR TO PIN (EXFIX) ×6 IMPLANT
CUFF TOURN SGL QUICK 18 (TOURNIQUET CUFF) IMPLANT
CUFF TOURN SGL QUICK 24 (TOURNIQUET CUFF)
CUFF TOURN SGL QUICK 34 (TOURNIQUET CUFF)
CUFF TRNQT CYL 24X4X40X1 (TOURNIQUET CUFF) IMPLANT
CUFF TRNQT CYL 34X4X40X1 (TOURNIQUET CUFF) IMPLANT
DRAIN PENROSE 18X1/2 LTX STRL (DRAIN) ×4 IMPLANT
DRSG PAD ABDOMINAL 8X10 ST (GAUZE/BANDAGES/DRESSINGS) ×8 IMPLANT
DURAPREP 26ML APPLICATOR (WOUND CARE) ×4 IMPLANT
ELECT REM PT RETURN 9FT ADLT (ELECTROSURGICAL) ×4
ELECTRODE REM PT RTRN 9FT ADLT (ELECTROSURGICAL) ×2 IMPLANT
GAUZE SPONGE 4X4 12PLY STRL (GAUZE/BANDAGES/DRESSINGS) ×4 IMPLANT
GAUZE XEROFORM 5X9 LF (GAUZE/BANDAGES/DRESSINGS) ×3 IMPLANT
GLOVE BIO SURGEON STRL SZ7.5 (GLOVE) ×4 IMPLANT
GLOVE BIOGEL PI IND STRL 8 (GLOVE) ×2 IMPLANT
GLOVE BIOGEL PI INDICATOR 8 (GLOVE) ×2
GLOVE ECLIPSE 8.0 STRL XLNG CF (GLOVE) ×4 IMPLANT
GOWN STRL REUS W/TWL XL LVL3 (GOWN DISPOSABLE) ×4 IMPLANT
HANDPIECE INTERPULSE COAX TIP (DISPOSABLE) ×4
KIT BASIN OR (CUSTOM PROCEDURE TRAY) ×4 IMPLANT
PACK ORTHO EXTREMITY (CUSTOM PROCEDURE TRAY) ×4 IMPLANT
PAD ABD 8X10 STRL (GAUZE/BANDAGES/DRESSINGS) ×3 IMPLANT
PAD CAST 4YDX4 CTTN HI CHSV (CAST SUPPLIES) ×2 IMPLANT
PADDING CAST ABS 4INX4YD NS (CAST SUPPLIES) ×4
PADDING CAST ABS COTTON 4X4 ST (CAST SUPPLIES) ×2 IMPLANT
PADDING CAST COTTON 4X4 STRL (CAST SUPPLIES) ×4
PIN CLAMP 2BAR 75MM BLUE (EXFIX) ×3 IMPLANT
PIN HALF YELLOW 5X160X35 (EXFIX) ×6 IMPLANT
PIN TRANSFIXING 5.0 (EXFIX) ×3 IMPLANT
POSITIONER SURGICAL ARM (MISCELLANEOUS) ×4 IMPLANT
SET HNDPC FAN SPRY TIP SCT (DISPOSABLE) ×2 IMPLANT
SUT ETHILON 2 0 PS N (SUTURE) ×6 IMPLANT
SYR CONTROL 10ML LL (SYRINGE) ×4 IMPLANT
TOWEL OR 17X26 10 PK STRL BLUE (TOWEL DISPOSABLE) ×8 IMPLANT

## 2016-10-26 NOTE — Anesthesia Preprocedure Evaluation (Signed)
Anesthesia Evaluation  Patient identified by MRN, date of birth, ID band Patient awake    Reviewed: Allergy & Precautions, NPO status , Patient's Chart, lab work & pertinent test results  History of Anesthesia Complications Negative for: history of anesthetic complications  Airway Mallampati: II  TM Distance: >3 FB Neck ROM: Full    Dental  (+) Teeth Intact   Pulmonary asthma , former smoker,    breath sounds clear to auscultation       Cardiovascular  Rhythm:Regular     Neuro/Psych PSYCHIATRIC DISORDERS Anxiety negative neurological ROS     GI/Hepatic negative GI ROS, Neg liver ROS,   Endo/Other  diabetes  Renal/GU      Musculoskeletal Left open ankle fracture   Abdominal   Peds  Hematology negative hematology ROS (+)   Anesthesia Other Findings   Reproductive/Obstetrics                             Anesthesia Physical Anesthesia Plan  ASA: II  Anesthesia Plan: General   Post-op Pain Management:  Regional for Post-op pain   Induction: Intravenous and Rapid sequence  Airway Management Planned: Oral ETT  Additional Equipment: None  Intra-op Plan:   Post-operative Plan: Extubation in OR  Informed Consent: I have reviewed the patients History and Physical, chart, labs and discussed the procedure including the risks, benefits and alternatives for the proposed anesthesia with the patient or authorized representative who has indicated his/her understanding and acceptance.   Dental advisory given  Plan Discussed with: CRNA and Surgeon  Anesthesia Plan Comments:         Anesthesia Quick Evaluation

## 2016-10-26 NOTE — H&P (Signed)
[name] Carr is an 46 y.o. female.   Chief Complaint: left ankle pain; known open ankle fracture HPI:   46 yo female was accidentally stepped off of a curb wrong sustaining an acute injury to her left ankle.  Was brought to the ED with an obvious left ankle injury and was noted to have an open comminuted distal tibia and fibula fracture confirm by x-rays that I have reviewed.  She has been drinking alcohol and was returning from a party when this happened.  She reports severe left ankle pain and denies other injuries.  Past Medical History:  Diagnosis Date  . Anxiety   . Asthma   . Diabetes mellitus without complication [facility])     Past Surgical History:  Procedure Laterality Date  . BREAST SURGERY      History reviewed. No pertinent family history. Social History:  reports that she has quit smoking. She has never used smokeless tobacco. She reports that she drinks alcohol. She reports that she does not use drugs.  Allergies:  Allergies  Allergen Reactions  . Tetracyclines & Related Itching  . Latex Itching and Rash     (Not in a hospital admission)  Results for orders placed or performed during the hospital encounter of 10/26/16 (from the past 48 hour(s))  CBC with Differential     Status: Abnormal   Collection Time: 10/26/16  5:58 AM  Result Value Ref Range   WBC 19.8 (H) 4.0 - 10.5 K/uL   RBC 4.08 3.87 - 5.11 MIL/uL   Hemoglobin 13.1 12.0 - 15.0 g/dL   HCT 38.2 36.0 - 46.0 %   MCV 93.6 78.0 - 100.0 fL   MCH 32.1 26.0 - 34.0 pg   MCHC 34.3 30.0 - 36.0 g/dL   RDW 13.6 11.5 - 15.5 %   Platelets 389 150 - 400 K/uL   Neutrophils Relative % 82 %   Neutro Abs 16.3 (H) 1.7 - 7.7 K/uL   Lymphocytes Relative 9 %   Lymphs Abs 1.8 0.7 - 4.0 K/uL   Monocytes Relative 8 %   Monocytes Absolute 1.5 (H) 0.1 - 1.0 K/uL   Eosinophils Relative 1 %   Eosinophils Absolute 0.1 0.0 - 0.7 K/uL   Basophils Relative 0 %   Basophils Absolute 0.0 0.0 - 0.1 K/uL  Protime-INR     Status:  None   Collection Time: 10/26/16  5:58 AM  Result Value Ref Range   Prothrombin Time 12.6 11.4 - 15.2 seconds   INR 0.94   Comprehensive metabolic panel     Status: Abnormal   Collection Time: 10/26/16  5:58 AM  Result Value Ref Range   Sodium 138 135 - 145 mmol/L   Potassium 4.1 3.5 - 5.1 mmol/L   Chloride 104 101 - 111 mmol/L   CO2 23 22 - 32 mmol/L   Glucose, Bld 156 (H) 65 - 99 mg/dL   BUN 15 6 - 20 mg/dL   Creatinine, Ser 0.70 0.44 - 1.00 mg/dL   Calcium 8.4 (L) 8.9 - 10.3 mg/dL   Total Protein 7.1 6.5 - 8.1 g/dL   Albumin 4.3 3.5 - 5.0 g/dL   AST 17 15 - 41 U/L   ALT 24 14 - 54 U/L   Alkaline Phosphatase 68 38 - 126 U/L   Total Bilirubin <0.1 (L) 0.3 - 1.2 mg/dL   GFR calc non Af Amer >60 >60 mL/min   GFR calc Af Amer >60 >60 mL/min    Comment: (NOTE) The  eGFR has been calculated using the CKD EPI equation. This calculation has not been validated in all clinical situations. eGFR's persistently <60 mL/min signify possible Chronic Kidney Disease.    Anion gap 11 5 - 15   Dg Ankle Complete Left  Result Date: 10/26/2016 CLINICAL DATA:  Obvious deformity after falling tonight. EXAM: LEFT ANKLE COMPLETE - 3+ VIEW COMPARISON:  None. FINDINGS: There are severely comminuted fractures of the distal tibia and fibula with multiple displaced fragments at the fracture lines. The major tibial fracture components are displaced posteriorly a full shaft width. The major fibular fracture fragments are displaced posteriorly 1-2 shaft widths. Disruption of the soft tissues over the tibial fracture. No dislocation. IMPRESSION: Severely comminuted fractures of the distal tibia and fibula with scratch posterior displacement. Electronically Signed   By: Andreas Newport M.D.   On: 10/26/2016 06:48   Dg Foot 2 Views Left  Result Date: 10/26/2016 CLINICAL DATA:  Obvious deformity after falling tonight. EXAM: LEFT FOOT - 2 VIEW COMPARISON:  None. FINDINGS: Comminuted fractures of the distal tibia and  fibula, described in greater detail on the accompanying ankle radiographs. The bones and articulations of the foot are intact. Question soft tissue lacerations at the dorsum of the foot, and there may be a few small foreign particles within the soft tissues dorsal to the talus, navicular and cuneiforms. IMPRESSION: Distal tib-fib fractures. Bones and articulations of the foot are intact. Question foreign material in the soft tissues of the dorsum of the foot. Electronically Signed   By: Andreas Newport M.D.   On: 10/26/2016 06:46    Review of Systems  All other systems reviewed and are negative.   Blood pressure 135/87, pulse 89, temperature 97.7 F (36.5 C), temperature source Oral, resp. rate 18, height 5' 5"  (1.651 m), weight 134 lb (60.8 kg), last menstrual period 10/26/2016, SpO2 100 %. Physical Exam  Constitutional: She is oriented to person, place, and time. She appears well-developed and well-nourished.  HENT:  Head: Normocephalic and atraumatic.  Eyes: EOM are normal. Pupils are equal, round, and reactive to light.  Neck: Normal range of motion. Neck supple.  Cardiovascular: Normal rate and regular rhythm.   Respiratory: Effort normal and breath sounds normal.  GI: Soft. Bowel sounds are normal.  Musculoskeletal:       Left ankle: She exhibits decreased range of motion, swelling, ecchymosis, deformity and laceration. Tenderness. Lateral malleolus and medial malleolus tenderness found.       Feet:  Neurological: She is alert and oriented to person, place, and time.  Skin: Skin is warm and dry.  Psychiatric: She has a normal mood and affect.     Assessment/Plan This is a 46 year old with a comminuted left grade 1A open distal tibia and fibula fracture post a mechanical fall 1)  Will need to proceed urgently to the OR this am for an I&D of her open ankle wound and external fixation to temporarily align the bones to allow for soft-tissue recovery.  She understands this and the  risks and benefits of surgery have been discussed in detail.  Will then be admitted as an inpatient for IV antibiotics and a CT scan of the ankle.  Definitive surgery will be at a later date once the soft-tissue swelling has resolved.  Mcarthur Rossetti, MD 10/26/2016, 7:27 AM

## 2016-10-26 NOTE — Brief Op Note (Signed)
10/26/2016  10:01 AM  PATIENT:  [name] Carr  46 y.o. female  PRE-OPERATIVE DIAGNOSIS:  Left open distal tibia/fibula fracture  POST-OPERATIVE DIAGNOSIS:  Left open Grade 1A distal tibia/fibula fracture  PROCEDURE:  Procedure(s): EXTERNAL FIXATION ANKLE  (Left) IRRIGATION AND DEBRIDEMENT (Left)  SURGEON:  Surgeon(s) and Role:    * Mcarthur Rossetti, MD - Primary  PHYSICIAN ASSISTANT: Benita Stabile, PA-C  ANESTHESIA:   regional and general  EBL: < 50 cc  COUNTS:  YES  TOURNIQUET:  * No tourniquets in log *  DICTATION: .Other Dictation: Dictation Number [id number]  PLAN OF CARE: Admit to inpatient   PATIENT DISPOSITION:  PACU - hemodynamically stable.   Delay start of Pharmacological VTE agent (>24hrs) due to surgical blood loss or risk of bleeding: no

## 2016-10-26 NOTE — Transfer of Care (Signed)
Immediate Anesthesia Transfer of Care Note  Patient: [name] Carr  Procedure(s) Performed: Procedure(s): EXTERNAL FIXATION ANKLE  (Left) IRRIGATION AND DEBRIDEMENT (Left)  Patient Location: PACU  Anesthesia Type:General  Level of Consciousness:  sedated, patient cooperative and responds to stimulation  Airway & Oxygen Therapy:Patient Spontanous Breathing and Patient connected to face mask oxgen  Post-op Assessment:  Report given to PACU RN and Post -op Vital signs reviewed and stable  Post vital signs:  Reviewed and stable  Last Vitals:  Vitals:   10/26/16 0656 10/26/16 0730  BP: 135/87 126/83  Pulse: 89 94  Resp: 18 17  Temp:      Complications: No apparent anesthesia complications

## 2016-10-26 NOTE — Evaluation (Signed)
Physical Therapy Evaluation Patient Details Name: [name] Carr MRN: [id number] DOB: [birth year] Today's Date: 10/26/2016   History of Present Illness  46 yo female was accidentally stepped off of a curb wrong sustaining an acute injury to her left ankle.  Was brought to the ED with an obvious left ankle injury and was noted to have an open comminuted distal tibia and fibula fracture confirm by x-rays; She had been drinking alcohol and was returning from a party when this happened; s/p external fixation of L tib/fib fx  Clinical Impression  Pt admitted with above diagnosis. Pt currently with functional limitations due to the deficits listed below (see PT Problem List).  Pt will benefit from skilled PT to increase their independence and safety with mobility to allow discharge to the venue listed below.   Pt did well today, will see again in am for further gait and stair training     Follow Up Recommendations No PT follow up    Equipment Recommendations  Rolling walker with 5" wheels (may need w/c and or crutches -tbd)    Recommendations for Other Services       Precautions / Restrictions Precautions Precautions: Fall Restrictions Weight Bearing Restrictions: Yes LLE Weight Bearing: Non weight bearing      Mobility  Bed Mobility Overal bed mobility: Needs Assistance Bed Mobility: Supine to Sit;Sit to Supine     Supine to sit: Supervision Sit to supine: Supervision   General bed mobility comments: cues for LLE management, elevated on return to bed  Transfers Overall transfer level: Needs assistance Equipment used: Rolling walker (2 wheeled) Transfers: Sit to/from Stand Sit to Stand: Min assist;Min guard         General transfer comment: cues for hand placement  Ambulation/Gait Ambulation/Gait assistance: Min assist   Assistive device: Rolling walker (2 wheeled)       General Gait Details: cues for NWB, RW position, LLE position  Stairs             Wheelchair Mobility    Modified Rankin (Stroke Patients Only)       Balance Overall balance assessment: Needs assistance   Sitting balance-Leahy Scale: Good       Standing balance-Leahy Scale: Fair Standing balance comment: fair to poor, needs at least unilateral UE assist for safe balance                             Pertinent Vitals/Pain Pain Assessment: 0-10 Pain Location: L lower leg Pain Descriptors / Indicators: Aching;Sore Pain Intervention(s): Limited activity within patient's tolerance;Monitored during session;Patient requesting pain meds-RN notified    Home Living Family/patient expects to be discharged to:: Private residence Living Arrangements: Alone;Parent (lives alone, going to her parents) Available Help at Discharge: Family Type of Home: House Home Access: Stairs to enter   Technical brewer of Steps: 2 Home Layout: Able to live on main level with bedroom/bathroom Home Equipment: None      Prior Function Level of Independence: Independent               Hand Dominance        Extremity/Trunk Assessment   Upper Extremity Assessment Upper Extremity Assessment: Defer to OT evaluation    Lower Extremity Assessment Lower Extremity Assessment: LLE deficits/detail LLE Deficits / Details: pt is unable to move toes, absent sesation to light touch (had adductor block); she is able to move knee through near full AROM LLE: Unable to fully  assess due to immobilization       Communication   Communication: No difficulties  Cognition Arousal/Alertness: Awake/alert Behavior During Therapy: WFL for tasks assessed/performed Overall Cognitive Status: Within Functional Limits for tasks assessed                      General Comments      Exercises     Assessment/Plan    PT Assessment Patient needs continued PT services  PT Problem List Decreased strength;Decreased range of motion;Decreased activity tolerance;Decreased  mobility;Decreased knowledge of use of DME          PT Treatment Interventions DME instruction;Functional mobility training;Gait training;Therapeutic activities;Stair training;Patient/family education    PT Goals (Current goals can be found in the Care Plan section)  Acute Rehab PT Goals Patient Stated Goal: home to parents PT Goal Formulation: With patient Time For Goal Achievement: 10/29/16 Potential to Achieve Goals: Good    Frequency Min 3X/week   Barriers to discharge        Co-evaluation               End of Session   Activity Tolerance: Patient tolerated treatment well Patient left: with call bell/phone within reach;in bed;with bed alarm set;with family/visitor present           Time: 1430-1452 PT Time Calculation (min) (ACUTE ONLY): 22 min   Charges:   PT Evaluation $PT Eval Low Complexity: 1 Procedure     PT G Codes:        [name] Carr November 17, 2016, 3:46 PM

## 2016-10-26 NOTE — Anesthesia Postprocedure Evaluation (Addendum)
Anesthesia Post Note  Patient: [name] Carr  Procedure(s) Performed: Procedure(s) (LRB): EXTERNAL FIXATION ANKLE  (Left) IRRIGATION AND DEBRIDEMENT (Left)  Patient location during evaluation: PACU Anesthesia Type: General and Regional Level of consciousness: awake and alert Pain management: pain level controlled Vital Signs Assessment: post-procedure vital signs reviewed and stable Respiratory status: spontaneous breathing, respiratory function stable and patient connected to nasal cannula oxygen Cardiovascular status: blood pressure returned to baseline and stable Postop Assessment: no signs of nausea or vomiting Anesthetic complications: no       Last Vitals:  Vitals:   10/26/16 1100 10/26/16 1115  BP: 138/76 (!) 143/85  Pulse: 100 (!) 106  Resp: 13 16  Temp: 36.7 C 36.8 C    Last Pain:  Vitals:   10/26/16 1115  TempSrc:   PainSc: 0-No pain                 Kumiko Fishman

## 2016-10-26 NOTE — ED Triage Notes (Signed)
Fall/trip and hurt left leg/ankle with deformity noted bleeding controlled at this time.

## 2016-10-26 NOTE — Anesthesia Procedure Notes (Signed)
Anesthesia Regional Block:  Adductor canal block  Pre-Anesthetic Checklist: ,, timeout performed, Correct Patient, Correct Site, Correct Laterality, Correct Procedure, Correct Position, site marked, Risks and benefits discussed,  Surgical consent,  Pre-op evaluation,  At surgeon's request and post-op pain management  Laterality: Lower and Left  Prep: chloraprep       Needles:  Injection technique: Single-shot  Needle Type: Echogenic Stimulator Needle          Additional Needles:  Procedures: ultrasound guided (picture in chart) Adductor canal block Narrative:  Injection made incrementally with aspirations every 5 mL.  Performed by: Personally  Anesthesiologist: Mutasim Tuckey  Additional Notes: H+P and labs reviewed, risks and benefits discussed with patient, procedure tolerated well without complications      

## 2016-10-26 NOTE — Anesthesia Procedure Notes (Signed)
Anesthesia Regional Block:  Popliteal block  Pre-Anesthetic Checklist: ,, timeout performed, Correct Patient, Correct Site, Correct Laterality, Correct Procedure, Correct Position, site marked, Risks and benefits discussed,  Surgical consent,  Pre-op evaluation,  At surgeon's request and post-op pain management  Laterality: Lower and Left  Prep: chloraprep       Needles:  Injection technique: Single-shot  Needle Type: Echogenic Stimulator Needle          Additional Needles:  Procedures: ultrasound guided (picture in chart) Popliteal block Narrative:  Start time: 10/26/2016 9:51 AM End time: 10/26/2016 9:59 AM Injection made incrementally with aspirations every 5 mL.  Performed by: Personally  Anesthesiologist: Cylah Fannin  Additional Notes: H+P and labs reviewed, risks and benefits discussed with patient, procedure tolerated well without complications

## 2016-10-26 NOTE — Anesthesia Procedure Notes (Signed)

## 2016-10-26 NOTE — ED Provider Notes (Signed)
[facility] DEPT Provider Note   CSN: [id number] Arrival date & time: 10/26/16  [id number]     History   Chief Complaint Chief Complaint  Patient presents with  . Fall    HPI [name] Carr is a 46 y.o. female.  HPI Pt comes in with cc of fall. Pt admits to heavy drinking and had a fall. She had severe pain to the ankle immediately and bleeding. Pt has hx of DM. She is not on anticoagulation or antiplatelet. Pt denies pain elsewhere. Pt denies headaches, neck pain, chest pain, dib, abd pain.  Past Medical History:  Diagnosis Date  . Anxiety   . Asthma   . Diabetes mellitus without complication [facility])     Patient Active Problem List   Diagnosis Date Noted  . Open fracture of tibia and fibula, left, type I or II, initial encounter 10/26/2016    Past Surgical History:  Procedure Laterality Date  . BREAST SURGERY      OB History    No data available       Home Medications    Prior to Admission medications   Medication Sig Start Date End Date Taking? Authorizing Provider  ALPRAZolam Duanne Moron) 0.5 MG tablet Take 0.5 mg by mouth 2 (two) times daily as needed for anxiety.   Yes Historical Provider, MD  citalopram (CELEXA) 40 MG tablet Take 20 mg by mouth daily.   Yes Historical Provider, MD  montelukast (SINGULAIR) 10 MG tablet Take 10 mg by mouth at bedtime.   Yes Historical Provider, MD    Family History History reviewed. No pertinent family history.  Social History Social History  Substance Use Topics  . Smoking status: Former Research scientist (life sciences)  . Smokeless tobacco: Never Used  . Alcohol use Yes     Allergies   Tetracyclines & related and Latex   Review of Systems Review of Systems  ROS 10 Systems reviewed and are negative for acute change except as noted in the HPI.     Physical Exam Updated Vital Signs BP 126/83   Pulse 94   Temp 97.7 F (36.5 C) (Oral)   Resp 17   Ht 5\' 5"  (1.651 m)   Wt 134 lb (60.8 kg)   LMP 10/26/2016   SpO2 97%   BMI 22.30  kg/m   Physical Exam  Constitutional: She is oriented to person, place, and time. She appears well-developed.  HENT:  Head: Normocephalic and atraumatic.  Eyes: EOM are normal.  Neck: Normal range of motion. Neck supple.  Cardiovascular: Normal rate and intact distal pulses.   Pulmonary/Chest: Effort normal.  Abdominal: Bowel sounds are normal.  Musculoskeletal: She exhibits edema, tenderness and deformity.  Neurological: She is alert and oriented to person, place, and time.  Skin: Skin is warm and dry.  Nursing note and vitals reviewed.    ED Treatments / Results  Labs (all labs ordered are listed, but only abnormal results are displayed) Labs Reviewed  CBC WITH DIFFERENTIAL/PLATELET - Abnormal; Notable for the following:       Result Value   WBC 19.8 (*)    Neutro Abs 16.3 (*)    Monocytes Absolute 1.5 (*)    All other components within normal limits  COMPREHENSIVE METABOLIC PANEL - Abnormal; Notable for the following:    Glucose, Bld 156 (*)    Calcium 8.4 (*)    Total Bilirubin <0.1 (*)    All other components within normal limits  PROTIME-INR  ETHANOL  TYPE AND SCREEN  ABO/RH  EKG  EKG Interpretation None       Radiology Dg Ankle Complete Left  Result Date: 10/26/2016 CLINICAL DATA:  Obvious deformity after falling tonight. EXAM: LEFT ANKLE COMPLETE - 3+ VIEW COMPARISON:  None. FINDINGS: There are severely comminuted fractures of the distal tibia and fibula with multiple displaced fragments at the fracture lines. The major tibial fracture components are displaced posteriorly a full shaft width. The major fibular fracture fragments are displaced posteriorly 1-2 shaft widths. Disruption of the soft tissues over the tibial fracture. No dislocation. IMPRESSION: Severely comminuted fractures of the distal tibia and fibula with scratch posterior displacement. Electronically Signed   By: Andreas Newport M.D.   On: 10/26/2016 06:48   Dg Foot 2 Views Left  Result  Date: 10/26/2016 CLINICAL DATA:  Obvious deformity after falling tonight. EXAM: LEFT FOOT - 2 VIEW COMPARISON:  None. FINDINGS: Comminuted fractures of the distal tibia and fibula, described in greater detail on the accompanying ankle radiographs. The bones and articulations of the foot are intact. Question soft tissue lacerations at the dorsum of the foot, and there may be a few small foreign particles within the soft tissues dorsal to the talus, navicular and cuneiforms. IMPRESSION: Distal tib-fib fractures. Bones and articulations of the foot are intact. Question foreign material in the soft tissues of the dorsum of the foot. Electronically Signed   By: Andreas Newport M.D.   On: 10/26/2016 06:46    Procedures Procedures (including critical care time)  CRITICAL CARE Performed by: Varney Biles   Total critical care time: 40 minutes  Critical care time was exclusive of separately billable procedures and treating other patients.  Critical care was necessary to treat or prevent imminent or life-threatening deterioration.  Critical care was time spent personally by me on the following activities: development of treatment plan with patient and/or surrogate as well as nursing, discussions with consultants, evaluation of patient's response to treatment, examination of patient, obtaining history from patient or surrogate, ordering and performing treatments and interventions, ordering and review of laboratory studies, ordering and review of radiographic studies, pulse oximetry and re-evaluation of patient's condition.   Medications Ordered in ED Medications  ceFAZolin (ANCEF) IVPB 2g/100 mL premix (not administered)  fentaNYL (SUBLIMAZE) injection 50 mcg (50 mcg Intravenous Given 10/26/16 0559)  sodium chloride 0.9 % bolus 1,000 mL (1,000 mLs Intravenous New Bag/Given 10/26/16 0653)  ketamine 100 mg in normal saline 10 mL (10mg /mL) syringe (18 mg Intravenous Given 10/26/16 0647)     Initial  Impression / Assessment and Plan / ED Course  I have reviewed the triage vital signs and the nursing notes.  Pertinent labs & imaging results that were available during my care of the patient were reviewed by me and considered in my medical decision making (see chart for details).  Clinical Course     Pt with open fractured of the L ankle. She is diabetic -so at risk for infection. Pt reports that her tetanus is UTD. She was given fentanyl for pain at arrival. Had hypoxia - so next round will be ketamine. Dr. Rush Farmer, Orthopedics to take patient to the OR. 2 grams ancef given here. Neurovascularly intact.  Final Clinical Impressions(s) / ED Diagnoses   Final diagnoses:  Type I or II open bimalleolar fracture of left ankle, initial encounter    New Prescriptions New Prescriptions   No medications on file     Varney Biles, MD 10/26/16 [phone]

## 2016-10-27 ENCOUNTER — Inpatient Hospital Stay (HOSPITAL_COMMUNITY): Payer: BLUE CROSS/BLUE SHIELD

## 2016-10-27 ENCOUNTER — Encounter (HOSPITAL_COMMUNITY): Payer: Self-pay | Admitting: Orthopaedic Surgery

## 2016-10-27 LAB — GLUCOSE, CAPILLARY
GLUCOSE-CAPILLARY: 112 mg/dL — AB (ref 65–99)
Glucose-Capillary: 95 mg/dL (ref 65–99)
Glucose-Capillary: 125 mg/dL — ABNORMAL HIGH (ref 65–99)
Glucose-Capillary: 158 mg/dL — ABNORMAL HIGH (ref 65–99)

## 2016-10-27 NOTE — Progress Notes (Signed)
Spoke with patient at bedside, states she plans to d/c home with her parents (([address], [place], [place] [address]). Per PT no HH needed, patient agrees and thinks she will be fine on her own with her parents help. Will need RW and 3n1, possibly a knee walker. Contacted [facility] to deliver DME to room. Plan for d/c in am, per patient attending says she needs another day of abx.

## 2016-10-27 NOTE — Evaluation (Signed)
Occupational Therapy Evaluation Patient Details Name: [name] Carr MRN: [id number] DOB: 07/26/1971 Today's Date: 10/27/2016    History of Present Illness 46 yo female stepped off of a curb wrong sustaining an acute injury to her left ankle.  Was brought to the ED with an obvious left ankle injury and was noted to have an open comminuted distal tibia and fibula fracture confirm by x-rays; She had been drinking alcohol and was returning from a party when this happened; s/p external fixation of L tib/fib fx   Clinical Impression   Pt was admitted for the above.  All education was completed.  No further OT is needed at this time    Follow Up Recommendations  No OT follow up;Supervision - Intermittent    Equipment Recommendations  3 in 1 bedside commode    Recommendations for Other Services       Precautions / Restrictions Precautions Precautions: Fall Restrictions LLE Weight Bearing: Non weight bearing      Mobility Bed Mobility Overal bed mobility: Modified Independent       Supine to sit: Supervision Sit to supine: Supervision   General bed mobility comments: cues for LLE management, elevated on return to bed  Transfers   Equipment used: Rolling walker (2 wheeled) Transfers: Sit to/from Stand Sit to Stand: Min guard         General transfer comment: cues for hand placement    Balance           Standing balance support: During functional activity;Bilateral upper extremity supported Standing balance-Leahy Scale: Fair                              ADL Overall ADL's : Needs assistance/impaired     Grooming: Wash/dry Radiographer, therapeutic: Min guard;Ambulation;BSC;RW   Toileting- Clothing Manipulation and Hygiene: Supervision/safety;Sit to/from stand         General ADL Comments: pt needs min A for LB adls due to external fixator. She tends to move quickly and maintains NWB but  cued to move slowly for safety so she doesn't bump LLE     Vision     Perception     Praxis      Pertinent Vitals/Pain Pain Assessment: 0-10 Pain Score: 6  Pain Location: L lower leg Pain Descriptors / Indicators: Aching;Sore Pain Intervention(s): Monitored during session;Premedicated before session;Repositioned     Hand Dominance     Extremity/Trunk Assessment Upper Extremity Assessment Upper Extremity Assessment: Overall WFL for tasks assessed           Communication Communication Communication: No difficulties   Cognition Arousal/Alertness: Awake/alert Behavior During Therapy: WFL for tasks assessed/performed Overall Cognitive Status: Within Functional Limits for tasks assessed                     General Comments       Exercises       Shoulder Instructions      Home Living Family/patient expects to be discharged to:: Private residence Living Arrangements: Alone;Parent Available Help at Discharge: Family                   Bathroom Toilet: Standard     Home Equipment: None   Additional Comments: has external fixator. Will sponge bathe      Prior Functioning/Environment Level of Independence: Independent  OT Problem List:     OT Treatment/Interventions:      OT Goals(Current goals can be found in the care plan section) Acute Rehab OT Goals Patient Stated Goal: home to parents OT Goal Formulation: All assessment and education complete, DC therapy  OT Frequency:     Barriers to D/C:            Co-evaluation              End of Session    Activity Tolerance: Patient tolerated treatment well Patient left:  (w/c for test)   Time: [id number] OT Time Calculation (min): 13 min Charges:  OT General Charges $OT Visit: 1 Procedure OT Evaluation $OT Eval Low Complexity: 1 Procedure G-Codes:    Julieth Tugman 2016-11-10, 12:04 PM  Lesle Chris, OTR/L [phone] November 10, 2016

## 2016-10-27 NOTE — Progress Notes (Signed)
Physical Therapy Treatment Patient Details Name: [name] Carr MRN: [id number] DOB: 1970/12/19 Today's Date: 10/27/2016    History of Present Illness (P) 46 yo female stepped off of a curb wrong sustaining an acute injury to her left ankle.  Was brought to the ED with an obvious left ankle injury and was noted to have an open comminuted distal tibia and fibula fracture confirm by x-rays; She had been drinking alcohol and was returning from a party when this happened; s/p external fixation of L tib/fib fx    PT Comments    The patient is progressing very well. Is ambulatory with RW. Will need to practice 3 steps.   Follow Up Recommendations  No PT follow up     Equipment Recommendations  Rolling walker with 5" wheels    Recommendations for Other Services       Precautions / Restrictions Precautions Precautions: (P) Fall Restrictions LLE Weight Bearing: Non weight bearing    Mobility  Bed Mobility Overal bed mobility: Modified Independent                Transfers   Equipment used: Rolling walker (2 wheeled) Transfers: Sit to/from Stand Sit to Stand: Min guard         General transfer comment: cues for hand placement  Ambulation/Gait Ambulation/Gait assistance: Min guard Ambulation Distance (Feet): 20 Feet (x 2) Assistive device: Rolling walker (2 wheeled) Gait Pattern/deviations: Step-to pattern     General Gait Details: cues for NWB, RW position, LLE position   Stairs            Wheelchair Mobility    Modified Rankin (Stroke Patients Only)       Balance           Standing balance support: During functional activity;Bilateral upper extremity supported Standing balance-Leahy Scale: Fair                      Cognition Arousal/Alertness: Awake/alert Behavior During Therapy: (P) WFL for tasks assessed/performed Overall Cognitive Status: (P) Within Functional Limits for tasks assessed                      Exercises       General Comments        Pertinent Vitals/Pain Pain Assessment: (P) 0-10 Pain Score: 6  Pain Location: L lower leg Pain Descriptors / Indicators: Aching;Sore Pain Intervention(s): Monitored during session;Premedicated before session;Repositioned    Home Living Family/patient expects to be discharged to:: (P) Private residence Living Arrangements: (P) Alone;Parent Available Help at Discharge: (P) Family         Home Equipment: (P) None Additional Comments: (P) has external fixator. Will sponge bathe    Prior Function Level of Independence: (P) Independent          PT Goals (current goals can now be found in the care plan section) Progress towards PT goals: Progressing toward goals    Frequency    Min 4X/week      PT Plan Current plan remains appropriate;Frequency needs to be updated    Co-evaluation             End of Session   Activity Tolerance: Patient tolerated treatment well Patient left: in chair;with nursing/sitter in room     Time: 1118-1130 PT Time Calculation (min) (ACUTE ONLY): 12 min  Charges:  $Gait Training: 8-22 mins  G Codes:      Claretha Cooper 10/27/2016, 12:03 PM Tresa Endo PT [phone]

## 2016-10-27 NOTE — Progress Notes (Signed)
Subjective: 1 Day Post-Op Procedure(s) (LRB): EXTERNAL FIXATION ANKLE  (Left) IRRIGATION AND DEBRIDEMENT (Left) Patient reports pain as moderate.    Objective: Vital signs in last 24 hours: Temp:  [97.9 F (36.6 C)-98.9 F (37.2 C)] 98.9 F (37.2 C) (01/02 0557) Pulse Rate:  [74-111] 80 (01/02 0557) Resp:  [13-19] 16 (01/02 0557) BP: (116-144)/(66-92) 127/74 (01/02 0557) SpO2:  [96 %-100 %] 97 % (01/02 0557)  Intake/Output from previous day: 01/01 0701 - 01/02 0700 In: 2000 [P.O.:360; I.V.:1535; IV Piggyback:105] Out: 150 [Urine:100; Blood:50] Intake/Output this shift: No intake/output data recorded.   Recent Labs  10/26/16 0558  HGB 13.1    Recent Labs  10/26/16 0558  WBC 19.8*  RBC 4.08  HCT 38.2  PLT 389    Recent Labs  10/26/16 0558  NA 138  K 4.1  CL 104  CO2 23  BUN 15  CREATININE 0.70  GLUCOSE 156*  CALCIUM 8.4*    Recent Labs  10/26/16 0558  INR 0.94    Sensation intact distally Intact pulses distally Incision: dressing C/D/I Compartment soft  Assessment/Plan: 1 Day Post-Op Procedure(s) (LRB): EXTERNAL FIXATION ANKLE  (Left) IRRIGATION AND DEBRIDEMENT (Left) Up with therapy  NWB left ankle IV antibiotics today due to an open fracture CT scan today to better assess ankle. Plan for discharge to home tomorrow.  Mcarthur Rossetti 10/27/2016, 7:08 AM

## 2016-10-27 NOTE — Op Note (Signed)
[name], [name] NO.:  [id number]  MEDICAL RECORD NO.:  [id number]  LOCATION:                                 FACILITY:  PHYSICIAN:  Lind Guest. Ninfa Linden, M.D.DATE OF BIRTH:  01/01/1970  DATE OF PROCEDURE:  10/26/2016 DATE OF DISCHARGE:                              OPERATIVE REPORT   PREOPERATIVE DIAGNOSIS:  Left open grade 1A comminuted distal tibia and fibular fracture.  POSTOPERATIVE DIAGNOSIS:  Open left grade 1A comminuted distal tibia and fibular fracture.  PROCEDURE: 1. Irrigation and debridement of left open fracture ankle wounds. 2. __________ external fixation spanning left ankle joint.  SURGEON:  Lind Guest. Ninfa Linden, M.D.  ASSISTANT:  Erskine Emery, PA-C.  ANESTHESIA: 1. General. 2. Left regional popliteal block.  BLOOD LOSS:  Less than 100 mL.  COMPLICATIONS:  None.  INDICATIONS:  [name] Carr is a 46 year old female, who was walking home from a party or gathering early this morning when she tripped over a curb, injuring her left ankle.  She was transported to [facility] Emergency Room and found to have a left distal tibia and fibular fractures with significant displacement and comminution.  These were open fractures as well.  She understands the need and urgently proceeded to the operating room for management of her open fractures as well as temporizing the fracture with external fixation and stabilize the bone. She understands that definitive fixation will be at a later date.  The risks and benefits of this were explained to her in detail and she did understand the reason behind proceeding with surgery urgently.  PROCEDURE DESCRIPTION:  After informed consent was obtained, appropriate left ankle was marked.  She was brought to the operating room, placed supine on the operating table.  General anesthesia was then obtained. Her left ankle was then prepped with Betadine paint and scrub.  A time- out was called and she was  identified correct patient and correct left ankle.  I had noted that she had small punctate wounds medially and laterally, both of the fibula and the distal tibia.  I opened these both to expose bone to clean any soft tissue debris.  There was significant comminution of the soft tissue stripping, but no gross contamination I could see.  We then thoroughly irrigated both wounds with normal saline solution using pulsatile lavage.  We then proceeded with external fixation portion the case.  We were able to place temporary external fixation pins from anterior to posterior and the tibia proximal to the fracture and then one from medial lateral through the calcaneus.  We then used a Zimmer external fixation and a delta frame format to stabilize the fracture temporarily.  This was done all under direct fluoroscopic guidance.  Once we got the fracture is well aligned as possible, which was definitely quite difficult due to the posterior lateral comminution of the tibia, we were at least satisfied with the tension that took off the soft tissue, and the alignment of the ankle joint.  We then locked down the external fixation.  We closed the medial and lateral wounds with 2-0 nylon suture.  Xeroform and well-padded sterile dressing was applied around the ankle as well as  a posterior splint.  Anesthesia then obtained a popliteal block under ultrasound guidance.  She was awakened and extubated and taken to recovery room in stable condition.  All final counts were correct.  There were no complications noted.  Of note, Erskine Emery, PA-C assisted in the entire case.  His assistance was crucial for facilitating all aspects of this case.     Lind Guest. Ninfa Linden, M.D.   ______________________________ Lind Guest. Ninfa Linden, M.D.    CYB/MEDQ  D:  10/26/2016  T:  10/26/2016  Job:  [id number]

## 2016-10-28 ENCOUNTER — Other Ambulatory Visit (INDEPENDENT_AMBULATORY_CARE_PROVIDER_SITE_OTHER): Payer: Self-pay | Admitting: Orthopaedic Surgery

## 2016-10-28 LAB — GLUCOSE, CAPILLARY
GLUCOSE-CAPILLARY: 133 mg/dL — AB (ref 65–99)
Glucose-Capillary: 119 mg/dL — ABNORMAL HIGH (ref 65–99)

## 2016-10-28 MED ORDER — HYDROCODONE-ACETAMINOPHEN 5-325 MG PO TABS: 1.0000 | ORAL_TABLET | ORAL | 0 refills | Status: DC | PRN

## 2016-10-28 MED ORDER — CEPHALEXIN 500 MG PO CAPS: 500.0000 mg | ORAL_CAPSULE | Freq: Four times a day (QID) | ORAL | 0 refills | Status: DC

## 2016-10-28 MED ORDER — ASPIRIN EC 325 MG PO TBEC: 325.0000 mg | DELAYED_RELEASE_TABLET | Freq: Every day | ORAL | 0 refills | Status: DC

## 2016-10-28 MED ORDER — METHOCARBAMOL 500 MG PO TABS: 500.0000 mg | ORAL_TABLET | Freq: Four times a day (QID) | ORAL | 0 refills | Status: DC | PRN

## 2016-10-28 NOTE — Progress Notes (Signed)
Physical Therapy Treatment Patient Details Name: [name] Carr MRN: [id number] DOB: 1971-07-05 Today's Date: 10/28/2016    History of Present Illness 46 yo female stepped off of a curb wrong sustaining an acute injury to her left ankle.  Was brought to the ED with an obvious left ankle injury and was noted to have an open comminuted distal tibia and fibula fracture confirm by x-rays; She had been drinking alcohol and was returning from a party when this happened; s/p external fixation of L tib/fib fx    PT Comments    Pt is progressing well; she is compliant with NWB; also reviewed stairs with pt parents and instructed in  w/c use  Follow Up Recommendations  No PT follow up     Equipment Recommendations  Rolling walker with 5" wheels;Wheelchair (measurements PT);Wheelchair cushion (measurements PT)    Recommendations for Other Services       Precautions / Restrictions Precautions Precautions: Fall Restrictions Weight Bearing Restrictions: Yes LLE Weight Bearing: Non weight bearing    Mobility  Bed Mobility Overal bed mobility: Modified Independent Bed Mobility: Supine to Sit;Sit to Supine              Transfers Overall transfer level: Needs assistance Equipment used: Rolling walker (2 wheeled) Transfers: Sit to/from Stand Sit to Stand: Supervision         General transfer comment: cues for hand placement  Ambulation/Gait Ambulation/Gait assistance: Min guard;Supervision Ambulation Distance (Feet): 50 Feet Assistive device: Rolling walker (2 wheeled)       General Gait Details: cues for NWB, RW position, LLE position   Stairs Stairs: Yes   Stair Management: No rails;Backwards;Step to pattern;With walker Number of Stairs: 3 General stair comments: cues for technique and sequence  Wheelchair Mobility    Modified Rankin (Stroke Patients Only)       Balance Overall balance assessment: Needs assistance   Sitting balance-Leahy Scale: Good        Standing balance-Leahy Scale: Fair                      Cognition Arousal/Alertness: Awake/alert Behavior During Therapy: WFL for tasks assessed/performed Overall Cognitive Status: Within Functional Limits for tasks assessed                      Exercises      General Comments        Pertinent Vitals/Pain Pain Assessment: Faces Faces Pain Scale: Hurts little more Pain Location: L lower leg Pain Descriptors / Indicators: Aching;Sore Pain Intervention(s): Limited activity within patient's tolerance;Monitored during session;Premedicated before session    Home Living                      Prior Function            PT Goals (current goals can now be found in the care plan section) Acute Rehab PT Goals Patient Stated Goal: home to parents PT Goal Formulation: With patient Time For Goal Achievement: 10/29/16 Potential to Achieve Goals: Good Progress towards PT goals: Progressing toward goals    Frequency    Min 4X/week      PT Plan Current plan remains appropriate;Frequency needs to be updated    Co-evaluation             End of Session   Activity Tolerance: Patient tolerated treatment well Patient left: in bed;with call bell/phone within reach     Time: [id number] PT Time Calculation (  min) (ACUTE ONLY): 28 min  Charges:  $Gait Training: 23-37 mins                    G Codes:      Jalynn Waddell 2016/11/18, 1:12 PM

## 2016-10-28 NOTE — Care Management Note (Signed)
Case Management Note  Patient Details  Name: [name] Carr MRN: [id number] Date of Birth: February 19, 1971  Subjective/Objective:  46 y.o. Who will be going home today where she will be staying with Mother in private residence. Tried Knee walker but unable to use due to Ex Fixator. Will need W/C which I have ordered from [facility]. No therapy needs at present.                   Action/Plan: Anticipate discharge home today. No further CM needs but will be available should additional discharge needs arise.   Expected Discharge Date:                  Expected Discharge Plan:  Home/Self Care  In-House Referral:  NA  Discharge planning Services  CM Consult  Post Acute Care Choice:  Durable Medical Equipment Choice offered to:  Patient  DME Arranged:  3-N-1, Walker rolling, Lightweight manual wheelchair with seat cushion DME Agency:  [place]:  Patient Refused [place] Agency:  NA  Status of Service:  Completed, signed off  If discussed at [employer] of Stay Meetings, dates discussed:    Additional Comments:  Delrae Sawyers, RN 10/28/2016, 11:21 AM

## 2016-10-28 NOTE — Discharge Instructions (Signed)
Expect bloody drainage. Ice as needed for swelling. No weight on your left ankle.  No food or drink after midnight on 1/8 for surgery on 1/9.

## 2016-10-28 NOTE — Discharge Summary (Signed)
Patient ID: [name] Carr MRN: [id number] DOB/AGE: 11/01/1970 46 y.o.  Admit date: 10/26/2016 Discharge date: 10/28/2016  Admission Diagnoses:  Principal Problem:   Open fracture of tibia and fibula, left, type I or II, initial encounter Active Problems:   Open fracture of left tibia and fibula, type I or II, initial encounter   Discharge Diagnoses:  Same  Past Medical History:  Diagnosis Date  . Anxiety   . Asthma   . Diabetes mellitus without complication ([place])     Surgeries: Procedure(s): EXTERNAL FIXATION ANKLE  IRRIGATION AND DEBRIDEMENT on 10/26/2016   Consultants: Treatment Team:  Mcarthur Rossetti, MD  Discharged Condition: Improved  Hospital Course: [name] Carr is an 46 y.o. female who was admitted 10/26/2016 for operative treatment ofOpen fracture of tibia and fibula, left, type I or II, initial encounter. Patient has severe unremitting pain that affects sleep, daily activities, and work/hobbies. After pre-op clearance the patient was taken to the operating room on 10/26/2016 and underwent  Procedure(s): [place].    Patient was given perioperative antibiotics: Anti-infectives    Start     Dose/Rate Route Frequency Ordered Stop   10/28/16 0000  cephALEXin (KEFLEX) 500 MG capsule     500 mg Oral 4 times daily 10/28/16 0710     10/26/16 1700  ceFAZolin (ANCEF) IVPB 1 g/50 mL premix     1 g 100 mL/hr over 30 Minutes Intravenous Every 8 hours 10/26/16 1131 10/28/16 1659   10/26/16 0645  ceFAZolin (ANCEF) IVPB 2g/100 mL premix     2 g 200 mL/hr over 30 Minutes Intravenous  Once 10/26/16 [id number] 10/26/16 0858       Patient was given sequential compression devices, early ambulation, and chemoprophylaxis to prevent DVT.  Patient benefited maximally from hospital stay and there were no complications.    Recent vital signs: Patient Vitals for the past 24 hrs:  BP Temp Temp src Pulse Resp SpO2  10/28/16 0516 122/68  98.6 F (37 C) Oral 77 16 97 %  10/27/16 2219 130/80 98.8 F (37.1 C) Oral 81 16 99 %  10/27/16 1849 127/66 99.3 F (37.4 C) Oral 84 16 100 %  10/27/16 1411 (!) 150/94 98.2 F (36.8 C) Oral 83 18 99 %  10/27/16 1004 (!) 136/93 98.6 F (37 C) Oral 90 18 98 %     Recent laboratory studies:  Recent Labs  10/26/16 0558  WBC 19.8*  HGB 13.1  HCT 38.2  PLT 389  NA 138  K 4.1  CL 104  CO2 23  BUN 15  CREATININE 0.70  GLUCOSE 156*  INR 0.94  CALCIUM 8.4*     Discharge Medications:   Allergies as of 10/28/2016      Reactions   Tetracyclines & Related Itching   Latex Itching, Rash      Medication List    TAKE these medications   ALPRAZolam 0.5 MG tablet Commonly known as:  XANAX Take 0.5 mg by mouth 2 (two) times daily as needed for anxiety.   aspirin EC 325 MG tablet Take 1 tablet (325 mg total) by mouth daily.   cephALEXin 500 MG capsule Commonly known as:  KEFLEX Take 1 capsule (500 mg total) by mouth 4 (four) times daily.   citalopram 40 MG tablet Commonly known as:  CELEXA Take 20 mg by mouth daily.   HYDROcodone-acetaminophen 5-325 MG tablet Commonly known as:  NORCO/VICODIN Take 1-2 tablets by mouth every 4 (four) hours  as needed (every 4-6 hours as needed).   methocarbamol 500 MG tablet Commonly known as:  ROBAXIN Take 1 tablet (500 mg total) by mouth every 6 (six) hours as needed for muscle spasms.   montelukast 10 MG tablet Commonly known as:  SINGULAIR Take 10 mg by mouth at bedtime.            Durable Medical Equipment        Start     Ordered   10/26/16 1132  DME Walker rolling  Once    Question:  Patient needs a walker to treat with the following condition  Answer:  Open fracture of left tibia and fibula, type I or II, initial encounter   10/26/16 1131   10/26/16 1132  DME 3 n 1  Once     10/26/16 1131      Diagnostic Studies: Dg Ankle Complete Left  Result Date: 10/26/2016 CLINICAL DATA:  External fixation EXAM: LEFT ANKLE  COMPLETE - 3+ VIEW COMPARISON:  10/26/2016 FINDINGS: Multiple intraoperative spot images again show the heavily comminuted distal tibia and fibular fractures. Severely displaced fracture fragments are again noted, unchanged. External fixator device partially visualized. IMPRESSION: Severely comminuted and displaced distal left tibia and fibular fractures. Electronically Signed   By: Rolm Baptise M.D.   On: 10/26/2016 09:59   Dg Ankle Complete Left  Result Date: 10/26/2016 CLINICAL DATA:  Obvious deformity after falling tonight. EXAM: LEFT ANKLE COMPLETE - 3+ VIEW COMPARISON:  None. FINDINGS: There are severely comminuted fractures of the distal tibia and fibula with multiple displaced fragments at the fracture lines. The major tibial fracture components are displaced posteriorly a full shaft width. The major fibular fracture fragments are displaced posteriorly 1-2 shaft widths. Disruption of the soft tissues over the tibial fracture. No dislocation. IMPRESSION: Severely comminuted fractures of the distal tibia and fibula with scratch posterior displacement. Electronically Signed   By: Andreas Newport M.D.   On: 10/26/2016 06:48   Ct Ankle Left Wo Contrast  Result Date: 10/27/2016 CLINICAL DATA:  Severely comminuted and displaced left tibial and fibular fracture. Status post fall. EXAM: CT OF THE LEFT ANKLE WITHOUT CONTRAST TECHNIQUE: Multidetector CT imaging of the left ankle was performed according to the standard protocol. Multiplanar CT image reconstructions were also generated. COMPARISON:  None. FINDINGS: Bones/Joint/Cartilage Severely comminuted fracture of the distal fibular diaphysis and metaphysis with multiple fracture fragments are rotated 90 degrees and are perpendicular to the normal alignment. Severely comminuted fracture with a primary oblique fracture cleft involving the distal fibular metaphysis and epiphysis. Fracture involves the articular surface. There is a large fracture fragment  measuring 2 x 3.8 cm displaced proximally by a 3.7 cm containing a portion of the lateral articular surface. Metallic rod traversing the posterior calcaneus in satisfactory position. Subtalar joints are normal. No lytic or sclerotic osseous lesion. No other fracture or dislocation. Ligaments Suboptimally assessed by CT. Muscles and Tendons Muscles are normal. No intramuscular hematoma. No muscle atrophy. Intact flexor, extensor, peroneal and Achilles tendons. Soft tissues No fluid collection hematoma. Soft tissue air seen in the region of the comminuted tibial fracture which may be secondary to instrumentation versus open fracture. IMPRESSION: 1. Severely comminuted fracture of the distal fibular diaphysis and metaphysis with multiple fracture fragments are rotated 90 degrees and are perpendicular to the normal alignment. 2. Severely comminuted fracture with a primary oblique fracture cleft involving the distal fibular metaphysis and epiphysis. Fracture involves the articular surface. There is a large fracture fragment measuring  2 x 3.8 cm displaced proximally by a 3.7 cm containing a portion of the lateral articular surface. 3. Soft tissue air seen in the region of the comminuted tibial fracture which may be secondary to instrumentation versus open fracture Electronically Signed   By: Kathreen Devoid   On: 10/27/2016 12:25   Dg Foot 2 Views Left  Result Date: 10/26/2016 CLINICAL DATA:  Obvious deformity after falling tonight. EXAM: LEFT FOOT - 2 VIEW COMPARISON:  None. FINDINGS: Comminuted fractures of the distal tibia and fibula, described in greater detail on the accompanying ankle radiographs. The bones and articulations of the foot are intact. Question soft tissue lacerations at the dorsum of the foot, and there may be a few small foreign particles within the soft tissues dorsal to the talus, navicular and cuneiforms. IMPRESSION: Distal tib-fib fractures. Bones and articulations of the foot are intact. Question  foreign material in the soft tissues of the dorsum of the foot. Electronically Signed   By: Andreas Newport M.D.   On: 10/26/2016 06:46   Dg C-arm 1-60 Min-no Report  Result Date: 10/26/2016 There is no Radiologist interpretation  for this exam.   Disposition: 01-Home or Self Care  Discharge Instructions    Call MD / Call 911    Complete by:  As directed    If you experience chest pain or shortness of breath, CALL 911 and be transported to the hospital emergency room.  If you develope a fever above 101 F, pus (white drainage) or increased drainage or redness at the wound, or calf pain, call your surgeon's office.   Constipation Prevention    Complete by:  As directed    Drink plenty of fluids.  Prune juice may be helpful.  You may use a stool softener, such as Colace (over the counter) 100 mg twice a day.  Use MiraLax (over the counter) for constipation as needed.   Diet - low sodium heart healthy    Complete by:  As directed    Discharge patient    Complete by:  As directed    Increase activity slowly as tolerated    Complete by:  As directed       Follow-up Information    Mcarthur Rossetti, MD Follow up.   Specialty:  Orthopedic Surgery Why:  Surgery is scheduled for 11/03/16 Contact information: [place] [address] [phone]            Signed: Mcarthur Rossetti 10/28/2016, 7:10 AM

## 2016-10-28 NOTE — Progress Notes (Signed)
Patient ID: [name] Carr, female   DOB: [birth year], 46 y.o.   MRN: [id number] Doing well.  Can be discharged to home today.  Definitive surgery next week.

## 2016-10-29 ENCOUNTER — Other Ambulatory Visit (INDEPENDENT_AMBULATORY_CARE_PROVIDER_SITE_OTHER): Payer: Self-pay | Admitting: Physician Assistant

## 2016-10-30 ENCOUNTER — Telehealth (INDEPENDENT_AMBULATORY_CARE_PROVIDER_SITE_OTHER): Payer: Self-pay | Admitting: *Deleted

## 2016-10-30 NOTE — Telephone Encounter (Signed)
Pt had questions about medication. Pt stated the pain is not too bad but the contractions and muscle spasms are what is hurting. Pt asking if she can take something else for this.

## 2016-11-02 ENCOUNTER — Encounter (HOSPITAL_COMMUNITY): Payer: Self-pay | Admitting: *Deleted

## 2016-11-02 MED ORDER — TIZANIDINE HCL 4 MG PO TABS: 4.0000 mg | ORAL_TABLET | Freq: Three times a day (TID) | ORAL | 0 refills | Status: DC | PRN

## 2016-11-02 NOTE — Telephone Encounter (Signed)
I sent in Zanaflex to her pharmacy.

## 2016-11-02 NOTE — Progress Notes (Signed)
Spoke with pt for pre-op call. Pt denies cardiac history, chest pain or sob. Pt is diabetic, last A1C was 5.2 in November, 2017. Pt does not take any diabetic medications at this time. States fasting blood is usually in the 90's or below.

## 2016-11-02 NOTE — Telephone Encounter (Signed)
Please advise 

## 2016-11-03 ENCOUNTER — Ambulatory Visit (HOSPITAL_COMMUNITY): Payer: BLUE CROSS/BLUE SHIELD

## 2016-11-03 ENCOUNTER — Ambulatory Visit (HOSPITAL_COMMUNITY): Payer: BLUE CROSS/BLUE SHIELD | Admitting: Anesthesiology

## 2016-11-03 ENCOUNTER — Inpatient Hospital Stay (HOSPITAL_COMMUNITY)
Admission: AD | Admit: 2016-11-03 | Discharge: 2016-11-06 | DRG: 465 | Disposition: A | Discharge status: Home / Self Care | Payer: BLUE CROSS/BLUE SHIELD | Source: Ambulatory Visit | Attending: Orthopaedic Surgery | Admitting: Orthopaedic Surgery

## 2016-11-03 ENCOUNTER — Encounter (HOSPITAL_COMMUNITY): Payer: Self-pay | Admitting: *Deleted

## 2016-11-03 ENCOUNTER — Encounter (HOSPITAL_COMMUNITY)
Admission: AD | Disposition: A | Discharge status: Home / Self Care | Payer: Self-pay | Source: Ambulatory Visit | Attending: Orthopaedic Surgery

## 2016-11-03 DIAGNOSIS — R2689 Other abnormalities of gait and mobility: Secondary | ICD-10-CM

## 2016-11-03 DIAGNOSIS — Z79891 Long term (current) use of opiate analgesic: Secondary | ICD-10-CM | POA: Diagnosis not present

## 2016-11-03 DIAGNOSIS — Z79899 Other long term (current) drug therapy: Secondary | ICD-10-CM | POA: Diagnosis not present

## 2016-11-03 DIAGNOSIS — Z881 Allergy status to other antibiotic agents status: Secondary | ICD-10-CM

## 2016-11-03 DIAGNOSIS — F419 Anxiety disorder, unspecified: Secondary | ICD-10-CM | POA: Diagnosis present

## 2016-11-03 DIAGNOSIS — Z87891 Personal history of nicotine dependence: Secondary | ICD-10-CM

## 2016-11-03 DIAGNOSIS — S82872B Displaced pilon fracture of left tibia, initial encounter for open fracture type I or II: Principal | ICD-10-CM | POA: Diagnosis present

## 2016-11-03 DIAGNOSIS — Z833 Family history of diabetes mellitus: Secondary | ICD-10-CM | POA: Diagnosis not present

## 2016-11-03 DIAGNOSIS — Z7982 Long term (current) use of aspirin: Secondary | ICD-10-CM | POA: Diagnosis not present

## 2016-11-03 DIAGNOSIS — Z91040 Latex allergy status: Secondary | ICD-10-CM

## 2016-11-03 DIAGNOSIS — J45909 Unspecified asthma, uncomplicated: Secondary | ICD-10-CM | POA: Diagnosis present

## 2016-11-03 DIAGNOSIS — W1830XA Fall on same level, unspecified, initial encounter: Secondary | ICD-10-CM | POA: Diagnosis present

## 2016-11-03 DIAGNOSIS — Z419 Encounter for procedure for purposes other than remedying health state, unspecified: Secondary | ICD-10-CM

## 2016-11-03 DIAGNOSIS — E119 Type 2 diabetes mellitus without complications: Secondary | ICD-10-CM | POA: Diagnosis present

## 2016-11-03 DIAGNOSIS — S82872S Displaced pilon fracture of left tibia, sequela: Secondary | ICD-10-CM

## 2016-11-03 HISTORY — DX: Pneumonia, unspecified organism: J18.9

## 2016-11-03 HISTORY — DX: Adverse effect of unspecified anesthetic, initial encounter: T41.45XA

## 2016-11-03 HISTORY — PX: ORIF ANKLE FRACTURE: SHX5408

## 2016-11-03 HISTORY — PX: EXTERNAL FIXATION REMOVAL: SHX5040

## 2016-11-03 HISTORY — DX: Other complications of anesthesia, initial encounter: T88.59XA

## 2016-11-03 LAB — CBC
HCT: 34.2 % — ABNORMAL LOW (ref 36.0–46.0)
Hemoglobin: 11.3 g/dL — ABNORMAL LOW (ref 12.0–15.0)
MCH: 31.1 pg (ref 26.0–34.0)
MCHC: 33 g/dL (ref 30.0–36.0)
MCV: 94.2 fL (ref 78.0–100.0)
PLATELETS: 394 10*3/uL (ref 150–400)
RBC: 3.63 MIL/uL — ABNORMAL LOW (ref 3.87–5.11)
RDW: 13.2 % (ref 11.5–15.5)
WBC: 7.7 10*3/uL (ref 4.0–10.5)

## 2016-11-03 LAB — HCG, SERUM, QUALITATIVE: Preg, Serum: NEGATIVE

## 2016-11-03 LAB — GLUCOSE, CAPILLARY
GLUCOSE-CAPILLARY: 107 mg/dL — AB (ref 65–99)
GLUCOSE-CAPILLARY: 131 mg/dL — AB (ref 65–99)

## 2016-11-03 SURGERY — OPEN REDUCTION INTERNAL FIXATION (ORIF) ANKLE FRACTURE
Anesthesia: General | Site: Ankle | Laterality: Left

## 2016-11-03 MED ORDER — HYDROMORPHONE HCL 2 MG/ML IJ SOLN
1.0000 mg | INTRAMUSCULAR | Status: DC | PRN
  Administered 2016-11-04 – 2016-11-06 (×19): 1 mg via INTRAVENOUS
  Filled 2016-11-03 (×19): qty 1

## 2016-11-03 MED ORDER — METOCLOPRAMIDE HCL 5 MG PO TABS: 5.0000 mg | ORAL_TABLET | Freq: Three times a day (TID) | ORAL | Status: DC | PRN

## 2016-11-03 MED ORDER — PROMETHAZINE HCL 25 MG/ML IJ SOLN: 6.2500 mg | INTRAMUSCULAR | Status: DC | PRN

## 2016-11-03 MED ORDER — FENTANYL CITRATE (PF) 100 MCG/2ML IJ SOLN
INTRAMUSCULAR | Status: AC
  Filled 2016-11-03: qty 2

## 2016-11-03 MED ORDER — PROPOFOL 10 MG/ML IV BOLUS
INTRAVENOUS | Status: DC | PRN
  Administered 2016-11-03: 150 mg via INTRAVENOUS

## 2016-11-03 MED ORDER — KETOROLAC TROMETHAMINE 30 MG/ML IJ SOLN
INTRAMUSCULAR | Status: AC
  Filled 2016-11-03: qty 1

## 2016-11-03 MED ORDER — DIPHENHYDRAMINE HCL 12.5 MG/5ML PO ELIX: 12.5000 mg | ORAL_SOLUTION | ORAL | Status: DC | PRN

## 2016-11-03 MED ORDER — 0.9 % SODIUM CHLORIDE (POUR BTL) OPTIME
TOPICAL | Status: DC | PRN
  Administered 2016-11-03: 1000 mL

## 2016-11-03 MED ORDER — CYCLOBENZAPRINE HCL 10 MG PO TABS
10.0000 mg | ORAL_TABLET | Freq: Three times a day (TID) | ORAL | Status: DC | PRN
  Administered 2016-11-03 – 2016-11-06 (×6): 10 mg via ORAL
  Filled 2016-11-03 (×5): qty 1

## 2016-11-03 MED ORDER — ALPRAZOLAM 0.5 MG PO TABS
0.5000 mg | ORAL_TABLET | Freq: Two times a day (BID) | ORAL | Status: DC | PRN
  Administered 2016-11-04 – 2016-11-06 (×3): 0.5 mg via ORAL
  Filled 2016-11-03 (×4): qty 1

## 2016-11-03 MED ORDER — METHOCARBAMOL 500 MG PO TABS
500.0000 mg | ORAL_TABLET | Freq: Four times a day (QID) | ORAL | Status: DC | PRN
  Administered 2016-11-03 – 2016-11-06 (×10): 500 mg via ORAL
  Filled 2016-11-03 (×10): qty 1

## 2016-11-03 MED ORDER — CHLORHEXIDINE GLUCONATE 4 % EX LIQD: 60.0000 mL | Freq: Once | CUTANEOUS | Status: DC

## 2016-11-03 MED ORDER — LIDOCAINE 2% (20 MG/ML) 5 ML SYRINGE
INTRAMUSCULAR | Status: AC
  Filled 2016-11-03: qty 5

## 2016-11-03 MED ORDER — LACTATED RINGERS IV SOLN
INTRAVENOUS | Status: DC
  Administered 2016-11-03 (×4): via INTRAVENOUS

## 2016-11-03 MED ORDER — MONTELUKAST SODIUM 10 MG PO TABS
10.0000 mg | ORAL_TABLET | Freq: Every day | ORAL | Status: DC
  Administered 2016-11-03 – 2016-11-05 (×3): 10 mg via ORAL
  Filled 2016-11-03 (×3): qty 1

## 2016-11-03 MED ORDER — SODIUM CHLORIDE 0.9 % IV SOLN
INTRAVENOUS | Status: DC
  Administered 2016-11-03: 22:00:00 via INTRAVENOUS

## 2016-11-03 MED ORDER — ALBUMIN HUMAN 5 % IV SOLN
INTRAVENOUS | Status: DC | PRN
  Administered 2016-11-03: 18:00:00 via INTRAVENOUS

## 2016-11-03 MED ORDER — HYDROMORPHONE HCL 1 MG/ML IJ SOLN
INTRAMUSCULAR | Status: DC | PRN
  Administered 2016-11-03 (×3): 0.5 mg via INTRAVENOUS
  Administered 2016-11-03 (×2): .25 mg via INTRAVENOUS

## 2016-11-03 MED ORDER — CITALOPRAM HYDROBROMIDE 20 MG PO TABS
20.0000 mg | ORAL_TABLET | Freq: Every day | ORAL | Status: DC
Start: 2016-11-03 — End: 2016-11-06
  Administered 2016-11-03 – 2016-11-05 (×3): 20 mg via ORAL
  Filled 2016-11-03 (×3): qty 1

## 2016-11-03 MED ORDER — MIDAZOLAM HCL 2 MG/2ML IJ SOLN
INTRAMUSCULAR | Status: AC
  Administered 2016-11-03: 2 mg
  Filled 2016-11-03: qty 2

## 2016-11-03 MED ORDER — HYDROMORPHONE HCL 1 MG/ML IJ SOLN
INTRAMUSCULAR | Status: AC
  Filled 2016-11-03: qty 1

## 2016-11-03 MED ORDER — MIDAZOLAM HCL 5 MG/5ML IJ SOLN
INTRAMUSCULAR | Status: DC | PRN
  Administered 2016-11-03: 2 mg via INTRAVENOUS

## 2016-11-03 MED ORDER — ONDANSETRON HCL 4 MG/2ML IJ SOLN
INTRAMUSCULAR | Status: DC | PRN
  Administered 2016-11-03: 4 mg via INTRAVENOUS

## 2016-11-03 MED ORDER — MIDAZOLAM HCL 2 MG/2ML IJ SOLN
INTRAMUSCULAR | Status: AC
  Filled 2016-11-03: qty 2

## 2016-11-03 MED ORDER — ONDANSETRON HCL 4 MG/2ML IJ SOLN
INTRAMUSCULAR | Status: AC
  Filled 2016-11-03: qty 6

## 2016-11-03 MED ORDER — KETOROLAC TROMETHAMINE 30 MG/ML IJ SOLN
30.0000 mg | Freq: Once | INTRAMUSCULAR | Status: AC | PRN
  Administered 2016-11-03: 30 mg via INTRAVENOUS

## 2016-11-03 MED ORDER — CEFAZOLIN IN D5W 1 GM/50ML IV SOLN
1.0000 g | Freq: Four times a day (QID) | INTRAVENOUS | Status: AC
  Administered 2016-11-03 – 2016-11-04 (×3): 1 g via INTRAVENOUS
  Filled 2016-11-03 (×3): qty 50

## 2016-11-03 MED ORDER — FENTANYL CITRATE (PF) 100 MCG/2ML IJ SOLN
INTRAMUSCULAR | Status: DC | PRN
  Administered 2016-11-03 (×8): 50 ug via INTRAVENOUS

## 2016-11-03 MED ORDER — CEFAZOLIN SODIUM-DEXTROSE 2-4 GM/100ML-% IV SOLN
2.0000 g | INTRAVENOUS | Status: AC
  Administered 2016-11-03: 2 g via INTRAVENOUS
  Filled 2016-11-03: qty 100

## 2016-11-03 MED ORDER — METOCLOPRAMIDE HCL 5 MG/ML IJ SOLN: 5.0000 mg | Freq: Three times a day (TID) | INTRAMUSCULAR | Status: DC | PRN

## 2016-11-03 MED ORDER — ACETAMINOPHEN 650 MG RE SUPP: 650.0000 mg | Freq: Four times a day (QID) | RECTAL | Status: DC | PRN

## 2016-11-03 MED ORDER — FENTANYL CITRATE (PF) 100 MCG/2ML IJ SOLN
INTRAMUSCULAR | Status: AC
  Administered 2016-11-03: 100 ug
  Filled 2016-11-03: qty 2

## 2016-11-03 MED ORDER — LIDOCAINE 2% (20 MG/ML) 5 ML SYRINGE
INTRAMUSCULAR | Status: DC | PRN
  Administered 2016-11-03: 60 mg via INTRAVENOUS

## 2016-11-03 MED ORDER — HYDROMORPHONE HCL 1 MG/ML IJ SOLN
0.2500 mg | INTRAMUSCULAR | Status: DC | PRN
  Administered 2016-11-03: 0.25 mg via INTRAVENOUS
  Administered 2016-11-03: 0.5 mg via INTRAVENOUS

## 2016-11-03 MED ORDER — ACETAMINOPHEN 325 MG PO TABS: 650.0000 mg | ORAL_TABLET | Freq: Four times a day (QID) | ORAL | Status: DC | PRN

## 2016-11-03 MED ORDER — ASPIRIN EC 325 MG PO TBEC
325.0000 mg | DELAYED_RELEASE_TABLET | Freq: Every day | ORAL | Status: DC
  Administered 2016-11-04 – 2016-11-06 (×3): 325 mg via ORAL
  Filled 2016-11-03 (×3): qty 1

## 2016-11-03 MED ORDER — BUPIVACAINE HCL (PF) 0.25 % IJ SOLN
INTRAMUSCULAR | Status: AC
  Filled 2016-11-03: qty 30

## 2016-11-03 MED ORDER — PHENYLEPHRINE 40 MCG/ML (10ML) SYRINGE FOR IV PUSH (FOR BLOOD PRESSURE SUPPORT)
PREFILLED_SYRINGE | INTRAVENOUS | Status: AC
  Filled 2016-11-03: qty 10

## 2016-11-03 MED ORDER — DEXTROSE 5 % IV SOLN
500.0000 mg | Freq: Four times a day (QID) | INTRAVENOUS | Status: DC | PRN
  Filled 2016-11-03: qty 5

## 2016-11-03 MED ORDER — CYCLOBENZAPRINE HCL 10 MG PO TABS
ORAL_TABLET | ORAL | Status: AC
  Filled 2016-11-03: qty 1

## 2016-11-03 MED ORDER — HYDROCODONE-ACETAMINOPHEN 10-325 MG PO TABS
1.0000 | ORAL_TABLET | ORAL | Status: DC | PRN
  Administered 2016-11-03 – 2016-11-05 (×9): 2 via ORAL
  Administered 2016-11-05: 1 via ORAL
  Administered 2016-11-05 – 2016-11-06 (×4): 2 via ORAL
  Filled 2016-11-03 (×14): qty 2

## 2016-11-03 MED ORDER — ONDANSETRON HCL 4 MG/2ML IJ SOLN: 4.0000 mg | Freq: Four times a day (QID) | INTRAMUSCULAR | Status: DC | PRN

## 2016-11-03 MED ORDER — ONDANSETRON HCL 4 MG PO TABS: 4.0000 mg | ORAL_TABLET | Freq: Four times a day (QID) | ORAL | Status: DC | PRN

## 2016-11-03 MED ORDER — EPHEDRINE 5 MG/ML INJ
INTRAVENOUS | Status: AC
Start: 2016-11-03 — End: 2016-11-03
  Filled 2016-11-03: qty 20

## 2016-11-03 SURGICAL SUPPLY — 86 items
BANDAGE ACE 4X5 VEL STRL LF (GAUZE/BANDAGES/DRESSINGS) ×2 IMPLANT
BANDAGE ACE 6X5 VEL STRL LF (GAUZE/BANDAGES/DRESSINGS) ×2 IMPLANT
BANDAGE ESMARK 6X9 LF (GAUZE/BANDAGES/DRESSINGS) IMPLANT
BIT DRILL 2.5X2.75 QC CALB (BIT) ×4 IMPLANT
BNDG CMPR 9X6 STRL LF SNTH (GAUZE/BANDAGES/DRESSINGS) ×1
BNDG COHESIVE 6X5 TAN STRL LF (GAUZE/BANDAGES/DRESSINGS) ×2 IMPLANT
BNDG ESMARK 6X9 LF (GAUZE/BANDAGES/DRESSINGS) ×3
BONE CANC CHIPS 20CC PCAN1/4 (Bone Implant) ×3 IMPLANT
CHIPS CANC BONE 20CC PCAN1/4 (Bone Implant) ×1 IMPLANT
COVER SURGICAL LIGHT HANDLE (MISCELLANEOUS) ×3 IMPLANT
CUFF TOURNIQUET SINGLE 34IN LL (TOURNIQUET CUFF) ×2 IMPLANT
DRAPE C-ARM 42X72 X-RAY (DRAPES) ×3 IMPLANT
DRAPE U-SHAPE 47X51 STRL (DRAPES) ×3 IMPLANT
DRSG PAD ABDOMINAL 8X10 ST (GAUZE/BANDAGES/DRESSINGS) ×2 IMPLANT
ELECT REM PT RETURN 9FT ADLT (ELECTROSURGICAL) ×3
ELECTRODE REM PT RTRN 9FT ADLT (ELECTROSURGICAL) ×1 IMPLANT
GAUZE XEROFORM 5X9 LF (GAUZE/BANDAGES/DRESSINGS) ×2 IMPLANT
GLOVE BIO SURGEON STRL SZ8 (GLOVE) ×3 IMPLANT
GLOVE BIOGEL PI IND STRL 7.0 (GLOVE) IMPLANT
GLOVE BIOGEL PI INDICATOR 7.0 (GLOVE) ×2
GLOVE SURG SS PI 6.5 STRL IVOR (GLOVE) ×4 IMPLANT
GLOVE SURG SS PI 7.5 STRL IVOR (GLOVE) ×2 IMPLANT
GOWN STRL REUS W/ TWL LRG LVL3 (GOWN DISPOSABLE) ×2 IMPLANT
GOWN STRL REUS W/ TWL XL LVL3 (GOWN DISPOSABLE) ×4 IMPLANT
GOWN STRL REUS W/TWL LRG LVL3 (GOWN DISPOSABLE) ×3
GOWN STRL REUS W/TWL XL LVL3 (GOWN DISPOSABLE) ×6
GRAFT BNE CANC CHIPS 1-8 20CC (Bone Implant) IMPLANT
KIT BASIN OR (CUSTOM PROCEDURE TRAY) ×3 IMPLANT
KIT ROOM TURNOVER OR (KITS) ×3 IMPLANT
MANIFOLD NEPTUNE II (INSTRUMENTS) ×3 IMPLANT
NDL HYPO 25GX1X1/2 BEV (NEEDLE) IMPLANT
NEEDLE HYPO 25GX1X1/2 BEV (NEEDLE) ×3 IMPLANT
NS IRRIG 1000ML POUR BTL (IV SOLUTION) ×3 IMPLANT
PACK ORTHO EXTREMITY (CUSTOM PROCEDURE TRAY) ×3 IMPLANT
PAD ARMBOARD 7.5X6 YLW CONV (MISCELLANEOUS) ×4 IMPLANT
PAD CAST 4YDX4 CTTN HI CHSV (CAST SUPPLIES) IMPLANT
PADDING CAST COTTON 4X4 STRL (CAST SUPPLIES) ×3
PADDING CAST COTTON 6X4 STRL (CAST SUPPLIES) ×2 IMPLANT
PLATE 9H LT DIST ANTLAT TIB (Plate) ×3 IMPLANT
PLATE ANTLAT CNTR 156X9 (Plate) IMPLANT
PLATE LOCK 4H 109 LT DIST FIB (Plate) ×2 IMPLANT
PREFILTER NEPTUNE (MISCELLANEOUS) ×3 IMPLANT
PUTTY DBM STAGRAFT PLUS 10CC (Putty) ×2 IMPLANT
SCREW CORT FT 32X3.5XNONLOCK (Screw) IMPLANT
SCREW CORTICAL 3.5MM  28MM (Screw) ×6 IMPLANT
SCREW CORTICAL 3.5MM  32MM (Screw) ×2 IMPLANT
SCREW CORTICAL 3.5MM  42MM (Screw) ×2 IMPLANT
SCREW CORTICAL 3.5MM 26MM (Screw) ×2 IMPLANT
SCREW CORTICAL 3.5MM 28MM (Screw) IMPLANT
SCREW CORTICAL 3.5MM 32MM (Screw) ×1 IMPLANT
SCREW CORTICAL 3.5MM 42MM (Screw) IMPLANT
SCREW LOCK CORT STAR 3.5X12 (Screw) ×2 IMPLANT
SCREW LOCK CORT STAR 3.5X14 (Screw) ×6 IMPLANT
SCREW LOCK CORT STAR 3.5X22 (Screw) ×2 IMPLANT
SCREW LOCK CORT STAR 3.5X32 (Screw) ×2 IMPLANT
SCREW LOCK CORT STAR 3.5X38 (Screw) ×2 IMPLANT
SCREW LOCK CORT STAR 3.5X40 (Screw) ×4 IMPLANT
SCREW LOW PROFILE 12MMX3.5MM (Screw) ×4 IMPLANT
SCREW NON LOCKING LP 3.5 14MM (Screw) ×2 IMPLANT
SCREW NON LOCKING LP 3.5 16MM (Screw) ×2 IMPLANT
SPLINT PLASTER CAST XFAST 5X30 (CAST SUPPLIES) IMPLANT
SPLINT PLASTER XFAST SET 5X30 (CAST SUPPLIES) ×2
SPONGE GAUZE 4X4 12PLY STER LF (GAUZE/BANDAGES/DRESSINGS) ×4 IMPLANT
SPONGE LAP 18X18 X RAY DECT (DISPOSABLE) ×2 IMPLANT
SUCTION FRAZIER HANDLE 10FR (MISCELLANEOUS) ×2
SUCTION TUBE FRAZIER 10FR DISP (MISCELLANEOUS) ×1 IMPLANT
SUT ETHILON 2 0 FS 18 (SUTURE) ×15 IMPLANT
SUT FIBERWIRE #2 38 REV NDL BL (SUTURE) ×3
SUT FIBERWIRE 2-0 18 17.9 3/8 (SUTURE) ×3
SUT VIC AB 0 CT1 27 (SUTURE) ×12
SUT VIC AB 0 CT1 27XBRD ANBCTR (SUTURE) IMPLANT
SUT VIC AB 1 CT1 27 (SUTURE) ×6
SUT VIC AB 1 CT1 27XBRD ANBCTR (SUTURE) IMPLANT
SUT VIC AB 2-0 CT1 27 (SUTURE) ×6
SUT VIC AB 2-0 CT1 27XBRD (SUTURE) ×1 IMPLANT
SUT VIC AB 2-0 CT1 TAPERPNT 27 (SUTURE) IMPLANT
SUT VICRYL 0 CT 1 36IN (SUTURE) ×3 IMPLANT
SUTURE FIBERWR 2-0 18 17.9 3/8 (SUTURE) IMPLANT
SUTURE FIBERWR#2 38 REV NDL BL (SUTURE) IMPLANT
SYR CONTROL 10ML LL (SYRINGE) ×2 IMPLANT
TOWEL OR 17X24 6PK STRL BLUE (TOWEL DISPOSABLE) ×1 IMPLANT
TOWEL OR 17X26 10 PK STRL BLUE (TOWEL DISPOSABLE) ×3 IMPLANT
TRAY FOLEY METER SIL LF 16FR (CATHETERS) ×2 IMPLANT
TUBE CONNECTING 12'X1/4 (SUCTIONS) ×1
TUBE CONNECTING 12X1/4 (SUCTIONS) ×2 IMPLANT
WIRE K 1.6MM 144256 (MISCELLANEOUS) ×12 IMPLANT

## 2016-11-03 NOTE — Brief Op Note (Signed)
11/03/2016  8:01 PM  PATIENT:  Cecelia Byars  46 y.o. female  PRE-OPERATIVE DIAGNOSIS:  Left pilon fracture  POST-OPERATIVE DIAGNOSIS:  Left pilon fracture  PROCEDURE:  Procedure(s): OPEN REDUCTION INTERNAL FIXATION (ORIF) LEFT PILON FRACTURE (Left)  SURGEON:  Surgeon(s) and Role:    * Mcarthur Rossetti, MD - Primary  PHYSICIAN ASSISTANT: Benita Stabile, PA-C  ANESTHESIA:   general  EBL:  Total I/O In: -  Out: 50 [Urine:35; Blood:15]  COUNTS:  YES  TOURNIQUET:   Total Tourniquet Time Documented: Thigh (Left) - 120 minutes Total: Thigh (Left) - 120 minutes   DICTATION: .Other Dictation: Dictation Number [id number]  PLAN OF CARE: Admit to inpatient   PATIENT DISPOSITION:  PACU - hemodynamically stable.   Delay start of Pharmacological VTE agent (>24hrs) due to surgical blood loss or risk of bleeding: no

## 2016-11-03 NOTE — Anesthesia Postprocedure Evaluation (Signed)
Anesthesia Post Note  Patient: [name] Carr  Procedure(s) Performed: Procedure(s) (LRB): OPEN REDUCTION INTERNAL FIXATION (ORIF) LEFT PILON FRACTURE (Left) REMOVAL EXTERNAL FIXATION ANKLE (Left)  Patient location during evaluation: PACU Anesthesia Type: General Level of consciousness: awake and alert Pain management: pain level controlled Vital Signs Assessment: post-procedure vital signs reviewed and stable Respiratory status: spontaneous breathing, nonlabored ventilation, respiratory function stable and patient connected to nasal cannula oxygen Cardiovascular status: blood pressure returned to baseline and stable Postop Assessment: no signs of nausea or vomiting Anesthetic complications: no       Last Vitals:  Vitals:   11/03/16 2100 11/03/16 2124  BP: 129/79   Pulse: 89 91  Resp: 12 15  Temp:  36.9 C    Last Pain:  Vitals:   11/03/16 2124  TempSrc:   PainSc: 4                  Jaloni Sorber S

## 2016-11-03 NOTE — Anesthesia Procedure Notes (Signed)
Procedure Name: LMA Insertion Date/Time: 11/03/2016 4:19 PM Performed by: Everlean Cherry A Pre-anesthesia Checklist: Patient identified, Emergency Drugs available, Suction available and Patient being monitored Patient Re-evaluated:Patient Re-evaluated prior to inductionOxygen Delivery Method: Circle system utilized Preoxygenation: Pre-oxygenation with 100% oxygen Intubation Type: IV induction Ventilation: Mask ventilation without difficulty LMA: LMA inserted LMA Size: 4.0 Number of attempts: 1 Placement Confirmation: positive ETCO2 and breath sounds checked- equal and bilateral Tube secured with: Tape Dental Injury: Teeth and Oropharynx as per pre-operative assessment

## 2016-11-03 NOTE — Transfer of Care (Signed)
Immediate Anesthesia Transfer of Care Note  Patient: [name] Carr  Procedure(s) Performed: Procedure(s): OPEN REDUCTION INTERNAL FIXATION (ORIF) LEFT PILON FRACTURE (Left)  Patient Location: PACU  Anesthesia Type:GA combined with regional for post-op pain  Level of Consciousness: awake, alert , oriented and patient cooperative  Airway & Oxygen Therapy: Patient Spontanous Breathing and Patient connected to face mask oxygen  Post-op Assessment: Report given to RN and Post -op Vital signs reviewed and stable  Post vital signs: Reviewed and stable  Last Vitals:  Vitals:   11/03/16 1440 11/03/16 2030  BP:    Pulse: 75   Resp: 15   Temp:  (P) 36.8 C    Last Pain:  Vitals:   11/03/16 1319  TempSrc:   PainSc: 5       Patients Stated Pain Goal: 2 ([id number])  Complications: No apparent anesthesia complications

## 2016-11-03 NOTE — Anesthesia Preprocedure Evaluation (Addendum)
Anesthesia Evaluation  Patient identified by MRN, date of birth, ID band Patient awake    Reviewed: Allergy & Precautions, H&P , Patient's Chart, lab work & pertinent test results, reviewed documented beta blocker date and time   Airway Mallampati: II  TM Distance: >3 FB Neck ROM: full    Dental no notable dental hx.    Pulmonary former smoker,    Pulmonary exam normal breath sounds clear to auscultation       Cardiovascular  Rhythm:regular Rate:Normal     Neuro/Psych    GI/Hepatic   Endo/Other  diabetes  Renal/GU      Musculoskeletal   Abdominal   Peds  Hematology   Anesthesia Other Findings   Reproductive/Obstetrics                            Anesthesia Physical Anesthesia Plan  ASA: II  Anesthesia Plan: General   Post-op Pain Management:    Induction: Intravenous  Airway Management Planned: LMA  Additional Equipment:   Intra-op Plan:   Post-operative Plan:   Informed Consent: I have reviewed the patients History and Physical, chart, labs and discussed the procedure including the risks, benefits and alternatives for the proposed anesthesia with the patient or authorized representative who has indicated his/her understanding and acceptance.   Dental Advisory Given and Dental advisory given  Plan Discussed with: CRNA and Surgeon  Anesthesia Plan Comments: (Discussed GA with LMA, possible sore throat, potential need to switch to ETT, N/V, pulmonary aspiration. Questions answered. )        Anesthesia Quick Evaluation

## 2016-11-03 NOTE — H&P (Signed)
[name] Carr is an 46 y.o. female.   Chief Complaint:   Known left open pilon fracture HPI:   46 yo female who injured her left ankle on New Years eve after an accidental mechanical fall.  She was found to have an open Grade IA distal tibia and fibula fracture (Pilon).  She was taken to the OR urgently on 10/26/16 for irrigation and debridement of her open fractures and temporary stabilization with external fixation.  She now presents for definitive treatment of her highly complex left ankle injury.  Past Medical History:  Diagnosis Date  . Anxiety   . Asthma   . Complication of anesthesia    one time had difficulty waking up  . Diabetes mellitus without complication ([place])   . Pneumonia    walking pneumonia    Past Surgical History:  Procedure Laterality Date  . BREAST SURGERY    . EXTERNAL FIXATION LEG Left 10/26/2016   Procedure: EXTERNAL FIXATION ANKLE ;  Surgeon: Mcarthur Rossetti, MD;  Location: [facility];  Service: Orthopedics;  Laterality: Left;  . IRRIGATION AND DEBRIDEMENT FOOT Left 10/26/2016   Procedure: IRRIGATION AND DEBRIDEMENT;  Surgeon: Mcarthur Rossetti, MD;  Location: [facility];  Service: Orthopedics;  Laterality: Left;  . PILONIDAL CYST EXCISION      Family History  Problem Relation Age of Onset  . Breast cancer Mother   . Diabetes Mellitus II Father    Social History:  reports that she has quit smoking. She has never used smokeless tobacco. She reports that she drinks alcohol. She reports that she does not use drugs.  Allergies:  Allergies  Allergen Reactions  . Latex Hives, Itching and Rash  . Tetracycline Hives and Itching    No prescriptions prior to admission.    No results found for this or any previous visit (from the past 48 hour(s)). No results found.  Review of Systems  All other systems reviewed and are negative.   Last menstrual period 10/26/2016. Physical Exam  Constitutional: She is oriented to person, place, and time. She appears  well-developed and well-nourished.  HENT:  Head: Normocephalic and atraumatic.  Eyes: EOM are normal. Pupils are equal, round, and reactive to light.  Neck: Normal range of motion.  Cardiovascular: Normal rate and regular rhythm.   Respiratory: Effort normal and breath sounds normal.  GI: Soft. Bowel sounds are normal.  Musculoskeletal:       Left ankle: She exhibits decreased range of motion, swelling, ecchymosis, deformity and laceration. Tenderness. Lateral malleolus and medial malleolus tenderness found.  Neurological: She is alert and oriented to person, place, and time.  Skin: Skin is warm and dry.  Psychiatric: She has a normal mood and affect.     Assessment/Plan Open pilon fracture left ankle status-post I&D and external fixation 1)  She now presents for definitive fixation of her left complex ankle injury.  She understands fully the risks and benefits of this surgery and what it involves.  She will be admitted post-op for pain control, therapy, and IV antibiotics.  Mcarthur Rossetti, MD 11/03/2016, 11:39 AM

## 2016-11-04 ENCOUNTER — Encounter (HOSPITAL_COMMUNITY): Payer: Self-pay | Admitting: Orthopaedic Surgery

## 2016-11-04 LAB — BASIC METABOLIC PANEL WITH GFR
Anion gap: 7 (ref 5–15)
BUN: 6 mg/dL (ref 6–20)
CO2: 25 mmol/L (ref 22–32)
Calcium: 8.5 mg/dL — ABNORMAL LOW (ref 8.9–10.3)
Chloride: 104 mmol/L (ref 101–111)
Creatinine, Ser: 0.76 mg/dL (ref 0.44–1.00)
GFR calc Af Amer: >60 mL/min
GFR calc non Af Amer: >60 mL/min
Glucose, Bld: 105 mg/dL — ABNORMAL HIGH (ref 65–99)
Potassium: 3.9 mmol/L (ref 3.5–5.1)
Sodium: 136 mmol/L (ref 135–145)

## 2016-11-04 LAB — GLUCOSE, CAPILLARY
GLUCOSE-CAPILLARY: 119 mg/dL — AB (ref 65–99)
Glucose-Capillary: 124 mg/dL — ABNORMAL HIGH (ref 65–99)

## 2016-11-04 NOTE — Op Note (Signed)
NAMEKNOELLE, WOLLER NO.:  [id number]  MEDICAL RECORD NO.:  [id number]  LOCATION:                                 FACILITY:  PHYSICIAN:  Lind Guest. Ninfa Linden, M.D.DATE OF BIRTH:  [birth year]  DATE OF PROCEDURE:  11/03/2016 DATE OF DISCHARGE:                              OPERATIVE REPORT   PREOPERATIVE DIAGNOSIS:  Left grade 1A open pilon and distal fibular fracture status post irrigation and debridement, and status post external fixation.  POSTOPERATIVE DIAGNOSIS:  Left grade 1A open pilon and distal fibular fracture status post irrigation and debridement, and status post external fixation.  PROCEDURE: 1. Repeat irrigation and debridement of left ankle wounds. 2. Open reduction and internal fixation of left lateral     malleolus/distal fibular fracture. 3. Open reduction and internal fixation of left pilon/distal tibia     comminuted fracture.  IMPLANTS:  Biomet periarticular distal fibula and anterolateral distal tibial plates and screws.  SURGEON:  Lind Guest. Ninfa Linden, MD  ASSISTANT:  Erskine Emery, PA-C  ANESTHESIA:  General.  TOURNIQUET TIME:  2 hours.  ANTIBIOTICS:  2 g of IV Ancef.  BLOOD LOSS:  Less than 100 mL.  COMPLICATIONS:  None.  INDICATIONS:  Ms. Torchio is 46 year old female, who on January 1st, sustained a mechanical fall injuring her left ankle.  She sustained a grade 1A open distal tibia and fibular fracture which was basically a pilon type of fracture.  She was taken to the operating room on January 1st, where she underwent irrigation and debridement of her open wounds and spanning external fixation, spanning the ankle joint of the left ankle to temporize her injury.  She has now been on antibiotics and off her ankle for over week and now presents for definitive fixation of the fracture.  The risks and benefits were explained to her and her parents and she did understand the reason behind proceeding with  surgery.  PROCEDURE DESCRIPTION:  After informed consent was obtained, appropriate left ankle was marked.  She was brought to the operating room, placed supine on the operating table.  General anesthesia was then obtained.  A nonsterile tourniquet was placed around her upper left thigh and her left leg was prepped and draped from the knee down the ankle with Betadine scrub and paint as well as prepping the external fixation into the field.  We then removed the lateral external fixation bar and opened up her previous open wound and carried this proximally and distally.  We found a highly comminuted distal fibular fracture with a significant rotation to this.  Once we were able to try to piece together fracture fragments, we chose a periarticular locking plate after irrigating and debriding necrotic skin and soft tissue, sharply with knife.  We irrigated the wound some more and then we were able to place a bridging type of plate, getting the rotation of the fibula as correct as possible, securing this with bicortical screws proximally and locking screws distally.  We placed FiberWire sutures around the comminuted aspects of the distal third of the shaft of the fibula and then placed bone chips and putty into this wound after irrigating it.  We then closed tissue  over the plate and attention was turned toward the pilon/distal tibia fracture.  We made an anterior incision, carried this proximally and distally.  We dissected out the ankle joint approximately and able to define a highly comminuted intra-articular fracture.  We were able to tease the fracture with reduction forceps and temporary pins into place knowing that we still had to avoid the bone graft.  We chose a Biomet ALPS distal lateral tibial locking plate and secured this with bicortical screws proximally and locking screws distally.  We then filled the void with cancellous bone chips and putty after irrigating completely.  We  then removed the external fixator in its entirety and irrigated the wound again.  We closed the deep tissue with #1 Vicryl on both the lateral and anterior incision followed by 0 Vicryl in the deep tissue, 2-0 Vicryl in the subcutaneous tissue, interrupted 2-0 nylon in skin.  Xeroform well-padded sterile dressing was applied.  She was awakened, extubated, and taken to recovery room in stable condition. All final counts were correct.  There were no complications noted.  Of note, Erskine Emery, PA-C assisted in the entire case.  His assistance was crucial for facilitating all aspects of this case.     Lind Guest. Ninfa Linden, M.D.   ______________________________ Lind Guest. Ninfa Linden, M.D.    CYB/MEDQ  D:  11/03/2016  T:  11/04/2016  Job:  [id number]

## 2016-11-04 NOTE — Progress Notes (Signed)
Subjective: 1 Day Post-Op Procedure(s) (LRB): OPEN REDUCTION INTERNAL FIXATION (ORIF) LEFT PILON FRACTURE (Left) REMOVAL EXTERNAL FIXATION ANKLE (Left) Patient reports pain as moderate.    Objective: Vital signs in last 24 hours: Temp:  [97.8 F (36.6 C)-98.7 F (37.1 C)] 97.8 F (36.6 C) (01/10 0300) Pulse Rate:  [71-102] 77 (01/10 0300) Resp:  [12-18] 16 (01/10 0300) BP: (117-132)/(73-79) 117/73 (01/10 0300) SpO2:  [95 %-100 %] 100 % (01/10 0300) Weight:  [134 lb (60.8 kg)] 134 lb (60.8 kg) (01/09 1223)  Intake/Output from previous day: 01/09 0701 - 01/10 0700 In: 2560 [P.O.:60; I.V.:2250; IV Piggyback:250] Out: 1315 [Urine:1265; Blood:50] Intake/Output this shift: No intake/output data recorded.   Recent Labs  11/03/16 1316  HGB 11.3*    Recent Labs  11/03/16 1316  WBC 7.7  RBC 3.63*  HCT 34.2*  PLT 394    Recent Labs  11/04/16 0442  NA 136  K 3.9  CL 104  CO2 25  BUN 6  CREATININE 0.76  GLUCOSE 105*  CALCIUM 8.5*   No results for input(s): LABPT, INR in the last 72 hours.  Intact pulses distally Incision: dressing C/D/I Compartment soft  Assessment/Plan: 1 Day Post-Op Procedure(s) (LRB): OPEN REDUCTION INTERNAL FIXATION (ORIF) LEFT PILON FRACTURE (Left) REMOVAL EXTERNAL FIXATION ANKLE (Left) Up with therapy Plan for discharge tomorrow or Friday pending pain control  Mcarthur Rossetti 11/04/2016, 7:23 AM

## 2016-11-04 NOTE — Evaluation (Signed)
Physical Therapy Evaluation Patient Details Name: [name] Carr MRN: [id number] DOB: 24-Nov-1970 Today's Date: 11/04/2016   History of Present Illness  46 y.o. female s/p external fixator removal and ORIF L ankle. Fall with fx occurred 10/26/16 with subsequent sx for external fixator.   Clinical Impression  Pt admitted with above diagnosis. Pt currently with functional limitations due to the deficits listed below (see PT Problem List). On eval, pt required min guard assist with ambulation and supervision transfers. Mobility limited by pain. See below for eval details. Pt will benefit from skilled PT to increase their independence and safety with mobility to allow discharge to the venue listed below.  PT to follow acutely. No f/u services indicated. Pt has all needed DME.     Follow Up Recommendations No PT follow up    Equipment Recommendations  None recommended by PT    Recommendations for Other Services       Precautions / Restrictions Precautions Precautions: Fall Restrictions Weight Bearing Restrictions: Yes LLE Weight Bearing: Non weight bearing      Mobility  Bed Mobility Overal bed mobility: Modified Independent       Supine to sit: Modified independent (Device/Increase time) Sit to supine: Modified independent (Device/Increase time)      Transfers Overall transfer level: Needs assistance Equipment used: Rolling walker (2 wheeled) Transfers: Sit to/from Stand Sit to Stand: Supervision         General transfer comment: verbal cues for hand placement and sequencing  Ambulation/Gait Ambulation/Gait assistance: Min guard Ambulation Distance (Feet): 35 Feet Assistive device: Rolling walker (2 wheeled) Gait Pattern/deviations: Step-to pattern Gait velocity: decreased   General Gait Details: Pt demo safe RW management and good ability to maintain NWB LLE.  Stairs            Wheelchair Mobility    Modified Rankin (Stroke Patients Only)        Balance Overall balance assessment: Needs assistance Sitting-balance support: No upper extremity supported;Feet supported Sitting balance-Leahy Scale: Normal     Standing balance support: Bilateral upper extremity supported;During functional activity Standing balance-Leahy Scale: Fair Standing balance comment: RW needed for ambulation.                             Pertinent Vitals/Pain Pain Assessment: 0-10 Pain Score: 7  Pain Location: LLE after ambulation Pain Descriptors / Indicators: Throbbing;Spasm Pain Intervention(s): Limited activity within patient's tolerance;Monitored during session;Repositioned;Premedicated before session;Patient requesting pain meds-RN notified;Ice applied    Home Living Family/patient expects to be discharged to:: Private residence Living Arrangements: Parent Available Help at Discharge: Family Type of Home: House Home Access: Stairs to enter Entrance Stairs-Rails: None Entrance Stairs-Number of Steps: 2 Home Layout: Able to live on main level with bedroom/bathroom Home Equipment: Walker - 2 wheels;Bedside commode;Wheelchair - manual      Prior Function Level of Independence: Independent   Gait / Transfers Assistance Needed: Independent prior to fall 10-26-16. Since initial fall and sx, pt mod I with mod I in house. W/C for long distances.  ADL's / Homemaking Assistance Needed: Mom assisting with showers since initial sx 10-26-16.        Hand Dominance        Extremity/Trunk Assessment                Communication   Communication: No difficulties  Cognition Arousal/Alertness: Awake/alert Behavior During Therapy: WFL for tasks assessed/performed Overall Cognitive Status: Within Functional Limits for tasks assessed  General Comments      Exercises     Assessment/Plan    PT Assessment Patient needs continued PT services  PT Problem List Decreased activity tolerance;Decreased  mobility;Decreased knowledge of use of DME;Decreased balance;Pain          PT Treatment Interventions DME instruction;Gait training;Stair training;Functional mobility training;Balance training;Therapeutic activities;Therapeutic exercise;Patient/family education    PT Goals (Current goals can be found in the Care Plan section)  Acute Rehab PT Goals Patient Stated Goal: decrease pain PT Goal Formulation: With patient Time For Goal Achievement: 11/11/16 Potential to Achieve Goals: Good    Frequency Min 6X/week   Barriers to discharge        Co-evaluation               End of Session Equipment Utilized During Treatment: Gait belt Activity Tolerance: Patient tolerated treatment well Patient left: in bed;with call bell/phone within reach;with family/visitor present Nurse Communication: Mobility status;Patient requests pain meds         Time: [id number] PT Time Calculation (min) (ACUTE ONLY): 19 min   Charges:   PT Evaluation $PT Eval Low Complexity: 1 Procedure     PT G Codes:        Lorriane Shire 11/04/2016, 11:44 AM

## 2016-11-04 NOTE — Op Note (Deleted)
  The note originally documented on this encounter has been moved the the encounter in which it belongs.  

## 2016-11-05 LAB — BASIC METABOLIC PANEL
ANION GAP: 7 (ref 5–15)
BUN: 5 mg/dL — ABNORMAL LOW (ref 6–20)
CALCIUM: 8.4 mg/dL — AB (ref 8.9–10.3)
CO2: 24 mmol/L (ref 22–32)
Chloride: 107 mmol/L (ref 101–111)
Creatinine, Ser: 0.66 mg/dL (ref 0.44–1.00)
GFR calc Af Amer: >60 mL/min (ref >60)
GLUCOSE: 122 mg/dL — AB (ref 65–99)
Potassium: 3.9 mmol/L (ref 3.5–5.1)
SODIUM: 138 mmol/L (ref 135–145)

## 2016-11-05 NOTE — Care Management (Signed)
Patient has no Home Health or DME needs. Case manager signed off. Ricki Miller, RN BSN Case Manager

## 2016-11-05 NOTE — Progress Notes (Signed)
Subjective: 2 Days Post-Op Procedure(s) (LRB): OPEN REDUCTION INTERNAL FIXATION (ORIF) LEFT PILON FRACTURE (Left) REMOVAL EXTERNAL FIXATION ANKLE (Left) Patient reports pain as moderate.  Slow progress.  Objective: Vital signs in last 24 hours: Temp:  [98 F (36.7 C)-98.7 F (37.1 C)] 98.4 F (36.9 C) (01/11 0430) Pulse Rate:  [84-87] 87 (01/11 0430) Resp:  [15-16] 15 (01/11 0430) BP: (115-121)/(64-71) 115/67 (01/11 0430) SpO2:  [96 %-99 %] 96 % (01/11 0430)  Intake/Output from previous day: 01/10 0701 - 01/11 0700 In: 3695 [P.O.:1440; I.V.:2155; IV Piggyback:100] Out: 800 [Urine:800] Intake/Output this shift: No intake/output data recorded.   Recent Labs  11/03/16 1316  HGB 11.3*    Recent Labs  11/03/16 1316  WBC 7.7  RBC 3.63*  HCT 34.2*  PLT 394    Recent Labs  11/04/16 0442  NA 136  K 3.9  CL 104  CO2 25  BUN 6  CREATININE 0.76  GLUCOSE 105*  CALCIUM 8.5*   No results for input(s): LABPT, INR in the last 72 hours.  Sensation intact distally Intact pulses distally Incision: dressing C/D/I Compartment soft  Assessment/Plan: 2 Days Post-Op Procedure(s) (LRB): OPEN REDUCTION INTERNAL FIXATION (ORIF) LEFT PILON FRACTURE (Left) REMOVAL EXTERNAL FIXATION ANKLE (Left) Up with therapy Plan for discharge tomorrow  Mcarthur Rossetti 11/05/2016, 7:16 AM

## 2016-11-05 NOTE — Progress Notes (Signed)
Physical Therapy Treatment Patient Details Name: [name] Carr MRN: [id number] DOB: 1971/08/29 Today's Date: 11/05/2016    History of Present Illness 46 y.o. female s/p external fixator removal and ORIF L ankle. Fall with fx occurred 10/26/16 with subsequent sx for external fixator.     PT Comments    Patient continues to do well with PT and demonstrated increased activity tolerance today. Current plan remains appropriate.   Follow Up Recommendations  No PT follow up     Equipment Recommendations  None recommended by PT    Recommendations for Other Services       Precautions / Restrictions Precautions Precautions: Fall Restrictions Weight Bearing Restrictions: Yes LLE Weight Bearing: Non weight bearing    Mobility  Bed Mobility Overal bed mobility: Independent                Transfers Overall transfer level: Needs assistance Equipment used: Rolling walker (2 wheeled) Transfers: Sit to/from Stand Sit to Stand: Supervision         General transfer comment: cues for hand placement  Ambulation/Gait Ambulation/Gait assistance: Min guard;Supervision Ambulation Distance (Feet):  (31ft RW and 83ft crutches) Assistive device: Rolling walker (2 wheeled);Crutches Gait Pattern/deviations: Step-to pattern Gait velocity: decreased   General Gait Details: pt educated on use of crutches and therapist provided demonstrated prior to practice; pt with safe use of ADs and steady gait    Stairs            Wheelchair Mobility    Modified Rankin (Stroke Patients Only)       Balance     Sitting balance-Leahy Scale: Normal       Standing balance-Leahy Scale: Fair                      Cognition Arousal/Alertness: Awake/alert Behavior During Therapy: WFL for tasks assessed/performed Overall Cognitive Status: Within Functional Limits for tasks assessed                      Exercises      General Comments General comments (skin  integrity, edema, etc.): mom present for session      Pertinent Vitals/Pain Pain Assessment: 0-10 Pain Score: 7  Pain Location: L LE Pain Descriptors / Indicators: Aching;Guarding Pain Intervention(s): Limited activity within patient's tolerance;Monitored during session;Premedicated before session;Repositioned;Ice applied    Home Living                      Prior Function            PT Goals (current goals can now be found in the care plan section) Acute Rehab PT Goals Patient Stated Goal: decrease pain Progress towards PT goals: Progressing toward goals    Frequency    Min 6X/week      PT Plan Current plan remains appropriate    Co-evaluation             End of Session Equipment Utilized During Treatment: Gait belt Activity Tolerance: Patient tolerated treatment well Patient left: in bed;with call bell/phone within reach;with family/visitor present     Time: [id number] PT Time Calculation (min) (ACUTE ONLY): 30 min  Charges:  $Gait Training: 8-22 mins $Therapeutic Activity: 8-22 mins                    G Codes:      Salina April, PTA Pager: [phone]   11/05/2016, 3:27 PM

## 2016-11-06 LAB — BASIC METABOLIC PANEL
Anion gap: 8 (ref 5–15)
BUN: <5 mg/dL — ABNORMAL LOW (ref 6–20)
CHLORIDE: 103 mmol/L (ref 101–111)
CO2: 25 mmol/L (ref 22–32)
CREATININE: 0.64 mg/dL (ref 0.44–1.00)
Calcium: 8.6 mg/dL — ABNORMAL LOW (ref 8.9–10.3)
GFR calc non Af Amer: >60 mL/min (ref >60)
Glucose, Bld: 137 mg/dL — ABNORMAL HIGH (ref 65–99)
Potassium: 3.4 mmol/L — ABNORMAL LOW (ref 3.5–5.1)
Sodium: 136 mmol/L (ref 135–145)

## 2016-11-06 MED ORDER — CYCLOBENZAPRINE HCL 10 MG PO TABS: 10.0000 mg | ORAL_TABLET | Freq: Three times a day (TID) | ORAL | 0 refills | Status: DC | PRN

## 2016-11-06 MED ORDER — HYDROCODONE-ACETAMINOPHEN 10-325 MG PO TABS: 1.0000 | ORAL_TABLET | ORAL | 0 refills | Status: DC | PRN

## 2016-11-06 MED ORDER — CEPHALEXIN 500 MG PO CAPS: 500.0000 mg | ORAL_CAPSULE | Freq: Four times a day (QID) | ORAL | 0 refills | Status: DC

## 2016-11-06 NOTE — Progress Notes (Signed)
Pt ready for discharge. Education/instructions reviewed with pt and her family, and all questions/concerns addressed. IV removed and belongings gathered. Pt will be transported out via wheelchair to family member's vehicle. Will continue to monitor

## 2016-11-06 NOTE — Discharge Summary (Signed)
Patient ID: [name] Carr MRN: [id number] DOB/AGE: 1970-11-22 46 y.o.  Admit date: 11/03/2016 Discharge date: 11/06/2016  Admission Diagnoses:  Principal Problem:   Pilon fracture of left tibia, sequela   Discharge Diagnoses:  Same  Past Medical History:  Diagnosis Date  . Anxiety   . Asthma   . Complication of anesthesia    one time had difficulty waking up  . Diabetes mellitus without complication ([place])   . Pneumonia    walking pneumonia    Surgeries: Procedure(s): OPEN REDUCTION INTERNAL FIXATION (ORIF) LEFT PILON FRACTURE REMOVAL EXTERNAL FIXATION ANKLE on 11/03/2016   Consultants:   Discharged Condition: Improved  Hospital Course: [name] Carr is an 46 y.o. female who was admitted 11/03/2016 for operative treatment ofPilon fracture of left tibia, sequela. Patient has severe unremitting pain that affects sleep, daily activities, and work/hobbies. After pre-op clearance the patient was taken to the operating room on 11/03/2016 and underwent  Procedure(s): OPEN REDUCTION INTERNAL FIXATION (ORIF) LEFT PILON FRACTURE REMOVAL EXTERNAL FIXATION ANKLE.    Patient was given perioperative antibiotics: Anti-infectives    Start     Dose/Rate Route Frequency Ordered Stop   11/06/16 0000  cephALEXin (KEFLEX) 500 MG capsule     500 mg Oral 4 times daily 11/06/16 0653     11/03/16 2215  ceFAZolin (ANCEF) IVPB 1 g/50 mL premix     1 g 100 mL/hr over 30 Minutes Intravenous Every 6 hours 11/03/16 2204 11/04/16 1136   11/03/16 1300  ceFAZolin (ANCEF) IVPB 2g/100 mL premix     2 g 200 mL/hr over 30 Minutes Intravenous On call to O.R. 11/03/16 1250 11/03/16 1651       Patient was given sequential compression devices, early ambulation, and chemoprophylaxis to prevent DVT.  Patient benefited maximally from hospital stay and there were no complications.    Recent vital signs: Patient Vitals for the past 24 hrs:  BP Temp Temp src Pulse Resp SpO2  11/06/16 0410 (!) 143/81 98.1 F  (36.7 C) Oral (!) 110 17 97 %  11/05/16 2013 133/77 99.2 F (37.3 C) Oral (!) 105 16 94 %  11/05/16 1300 136/80 99 F (37.2 C) Oral (!) 106 16 92 %     Recent laboratory studies:  Recent Labs  11/03/16 1316  11/04/16 0442 11/05/16 0527  WBC 7.7  --   --   --   HGB 11.3*  --   --   --   HCT 34.2*  --   --   --   PLT 394  --   --   --   NA  --   --  136 138  K  --   --  3.9 3.9  CL  --   --  104 107  CO2  --   --  25 24  BUN  --   --  6 5*  CREATININE  --   --  0.76 0.66  GLUCOSE  --   --  105* 122*  CALCIUM  --   < > 8.5* 8.4*  < > = values in this interval not displayed.   Discharge Medications:   Allergies as of 11/06/2016      Reactions   Latex Hives, Itching, Rash   Tetracycline Hives, Itching      Medication List    STOP taking these medications   HYDROcodone-acetaminophen 5-325 MG tablet Commonly known as:  NORCO/VICODIN Replaced by:  HYDROcodone-acetaminophen 10-325 MG tablet   tiZANidine 4 MG tablet  Commonly known as:  ZANAFLEX     TAKE these medications   ALPRAZolam 0.5 MG tablet Commonly known as:  XANAX Take 0.5 mg by mouth 2 (two) times daily as needed for anxiety.   aspirin EC 325 MG tablet Take 1 tablet (325 mg total) by mouth daily.   cephALEXin 500 MG capsule Commonly known as:  KEFLEX Take 1 capsule (500 mg total) by mouth 4 (four) times daily.   citalopram 40 MG tablet Commonly known as:  CELEXA Take 20 mg by mouth at bedtime.   cyclobenzaprine 10 MG tablet Commonly known as:  FLEXERIL Take 1 tablet (10 mg total) by mouth 3 (three) times daily as needed for muscle spasms.   HYDROcodone-acetaminophen 10-325 MG tablet Commonly known as:  NORCO Take 1-2 tablets by mouth every 4 (four) hours as needed (breakthrough pain). Replaces:  HYDROcodone-acetaminophen 5-325 MG tablet   methocarbamol 500 MG tablet Commonly known as:  ROBAXIN Take 1 tablet (500 mg total) by mouth every 6 (six) hours as needed for muscle spasms.   montelukast  10 MG tablet Commonly known as:  SINGULAIR Take 10 mg by mouth at bedtime.       Diagnostic Studies: Dg Ankle Complete Left  Result Date: 11/03/2016 CLINICAL DATA:  Internal fixation of left ankle fracture. Initial encounter. EXAM: DG C-ARM GT 120 MIN; LEFT ANKLE COMPLETE - 3+ VIEW COMPARISON:  Left ankle CT performed 10/27/2016 FINDINGS: Four fluoroscopic C-arm images are provided from the OR, demonstrating internal fixation of distal tibial and fibular fractures in near anatomic alignment. Associated postoperative soft tissue air is noted. No new fractures are seen. The ankle mortise is grossly unremarkable in appearance. IMPRESSION: Status post internal fixation of distal tibial and fibular fractures in near anatomic alignment. Electronically Signed   By: Garald Balding M.D.   On: 11/03/2016 20:45   Dg Ankle Complete Left  Result Date: 10/26/2016 CLINICAL DATA:  External fixation EXAM: LEFT ANKLE COMPLETE - 3+ VIEW COMPARISON:  10/26/2016 FINDINGS: Multiple intraoperative spot images again show the heavily comminuted distal tibia and fibular fractures. Severely displaced fracture fragments are again noted, unchanged. External fixator device partially visualized. IMPRESSION: Severely comminuted and displaced distal left tibia and fibular fractures. Electronically Signed   By: Rolm Baptise M.D.   On: 10/26/2016 09:59   Dg Ankle Complete Left  Result Date: 10/26/2016 CLINICAL DATA:  Obvious deformity after falling tonight. EXAM: LEFT ANKLE COMPLETE - 3+ VIEW COMPARISON:  None. FINDINGS: There are severely comminuted fractures of the distal tibia and fibula with multiple displaced fragments at the fracture lines. The major tibial fracture components are displaced posteriorly a full shaft width. The major fibular fracture fragments are displaced posteriorly 1-2 shaft widths. Disruption of the soft tissues over the tibial fracture. No dislocation. IMPRESSION: Severely comminuted fractures of the distal  tibia and fibula with scratch posterior displacement. Electronically Signed   By: Andreas Newport M.D.   On: 10/26/2016 06:48   Ct Ankle Left Wo Contrast  Result Date: 10/27/2016 CLINICAL DATA:  Severely comminuted and displaced left tibial and fibular fracture. Status post fall. EXAM: CT OF THE LEFT ANKLE WITHOUT CONTRAST TECHNIQUE: Multidetector CT imaging of the left ankle was performed according to the standard protocol. Multiplanar CT image reconstructions were also generated. COMPARISON:  None. FINDINGS: Bones/Joint/Cartilage Severely comminuted fracture of the distal fibular diaphysis and metaphysis with multiple fracture fragments are rotated 90 degrees and are perpendicular to the normal alignment. Severely comminuted fracture with a primary oblique fracture cleft  involving the distal fibular metaphysis and epiphysis. Fracture involves the articular surface. There is a large fracture fragment measuring 2 x 3.8 cm displaced proximally by a 3.7 cm containing a portion of the lateral articular surface. Metallic rod traversing the posterior calcaneus in satisfactory position. Subtalar joints are normal. No lytic or sclerotic osseous lesion. No other fracture or dislocation. Ligaments Suboptimally assessed by CT. Muscles and Tendons Muscles are normal. No intramuscular hematoma. No muscle atrophy. Intact flexor, extensor, peroneal and Achilles tendons. Soft tissues No fluid collection hematoma. Soft tissue air seen in the region of the comminuted tibial fracture which may be secondary to instrumentation versus open fracture. IMPRESSION: 1. Severely comminuted fracture of the distal fibular diaphysis and metaphysis with multiple fracture fragments are rotated 90 degrees and are perpendicular to the normal alignment. 2. Severely comminuted fracture with a primary oblique fracture cleft involving the distal fibular metaphysis and epiphysis. Fracture involves the articular surface. There is a large fracture  fragment measuring 2 x 3.8 cm displaced proximally by a 3.7 cm containing a portion of the lateral articular surface. 3. Soft tissue air seen in the region of the comminuted tibial fracture which may be secondary to instrumentation versus open fracture Electronically Signed   By: Kathreen Devoid   On: 10/27/2016 12:25   Dg Foot 2 Views Left  Result Date: 10/26/2016 CLINICAL DATA:  Obvious deformity after falling tonight. EXAM: LEFT FOOT - 2 VIEW COMPARISON:  None. FINDINGS: Comminuted fractures of the distal tibia and fibula, described in greater detail on the accompanying ankle radiographs. The bones and articulations of the foot are intact. Question soft tissue lacerations at the dorsum of the foot, and there may be a few small foreign particles within the soft tissues dorsal to the talus, navicular and cuneiforms. IMPRESSION: Distal tib-fib fractures. Bones and articulations of the foot are intact. Question foreign material in the soft tissues of the dorsum of the foot. Electronically Signed   By: Andreas Newport M.D.   On: 10/26/2016 06:46   Dg C-arm 1-60 Min-no Report  Result Date: 10/26/2016 There is no Radiologist interpretation  for this exam.  Dg C-arm Gt 12 Min  Result Date: 11/03/2016 CLINICAL DATA:  Internal fixation of left ankle fracture. Initial encounter. EXAM: DG C-ARM GT 120 MIN; LEFT ANKLE COMPLETE - 3+ VIEW COMPARISON:  Left ankle CT performed 10/27/2016 FINDINGS: Four fluoroscopic C-arm images are provided from the OR, demonstrating internal fixation of distal tibial and fibular fractures in near anatomic alignment. Associated postoperative soft tissue air is noted. No new fractures are seen. The ankle mortise is grossly unremarkable in appearance. IMPRESSION: Status post internal fixation of distal tibial and fibular fractures in near anatomic alignment. Electronically Signed   By: Garald Balding M.D.   On: 11/03/2016 20:45    Disposition: 01-Home or Self Care  Discharge  Instructions    Discharge patient    Complete by:  As directed    Discharge disposition:  01-Home or Self Care   Discharge patient date:  11/06/2016      Follow-up Information    Mcarthur Rossetti, MD. Schedule an appointment as soon as possible for a visit in 2 week(s).   Specialty:  Orthopedic Surgery Contact information: [place] [address] [phone]            Signed: Mcarthur Rossetti 11/06/2016, 6:53 AM

## 2016-11-06 NOTE — Progress Notes (Signed)
Physical Therapy Treatment Patient Details Name: [name] Carr MRN: [id number] DOB: [birth year] Today's Date: 11/06/2016    History of Present Illness 46 y.o. female s/p external fixator removal and ORIF L ankle. Fall with fx occurred 10/26/16 with subsequent sx for external fixator.     PT Comments    Pt tolerated L LE therex well. Given HEP handout and reviewed. Pt wants to save energy for getting shower with assistance from mother and has been managing mod I/I with ex fix PTA. Current plan remains appropriate.   Follow Up Recommendations  No PT follow up     Equipment Recommendations  None recommended by PT    Recommendations for Other Services       Precautions / Restrictions Precautions Precautions: Fall Restrictions Weight Bearing Restrictions: Yes LLE Weight Bearing: Non weight bearing    Mobility  Bed Mobility                  Transfers                    Ambulation/Gait                 Stairs            Wheelchair Mobility    Modified Rankin (Stroke Patients Only)       Balance     Sitting balance-Leahy Scale: Normal       Standing balance-Leahy Scale: Fair                      Cognition Arousal/Alertness: Awake/alert Behavior During Therapy: WFL for tasks assessed/performed Overall Cognitive Status: Within Functional Limits for tasks assessed                      Exercises General Exercises - Lower Extremity Quad Sets: AROM;Left;10 reps Heel Slides: AROM;Left;10 reps Hip ABduction/ADduction: AROM;Left;10 reps Straight Leg Raises: AROM;Left;10 reps    General Comments General comments (skin integrity, edema, etc.): pt given HEP handout and reviewed      Pertinent Vitals/Pain Pain Assessment: 0-10 Pain Score: 5  Pain Location: L LE Pain Descriptors / Indicators: Sore Pain Intervention(s): Limited activity within patient's tolerance;Monitored during session;Repositioned;Patient requesting  pain meds-RN notified    Home Living                      Prior Function            PT Goals (current goals can now be found in the care plan section) Acute Rehab PT Goals Patient Stated Goal: decrease pain Progress towards PT goals: Progressing toward goals    Frequency    Min 6X/week      PT Plan Current plan remains appropriate    Co-evaluation             End of Session Equipment Utilized During Treatment: Gait belt Activity Tolerance: Patient tolerated treatment well Patient left: in bed;with call bell/phone within reach;with family/visitor present     Time: [id number] PT Time Calculation (min) (ACUTE ONLY): 18 min  Charges:  $Therapeutic Exercise: 8-22 mins                    G Codes:      [place] April, PTA Pager: [phone]   11/06/2016, 10:25 AM

## 2016-11-06 NOTE — Discharge Instructions (Signed)
Keep your dressing clean and dry. No weight at all on your left ankle. Elevation and ice for swelling.  Do take a full-strength 325 mg aspirin daily. Do try to gently move your ankle.

## 2016-11-06 NOTE — Progress Notes (Signed)
Subjective: 3 Days Post-Op Procedure(s) (LRB): OPEN REDUCTION INTERNAL FIXATION (ORIF) LEFT PILON FRACTURE (Left) REMOVAL EXTERNAL FIXATION ANKLE (Left) Patient reports pain as moderate.  Spasms a little better controlled.  Objective: Vital signs in last 24 hours: Temp:  [98.1 F (36.7 C)-99.2 F (37.3 C)] 98.1 F (36.7 C) (01/12 0410) Pulse Rate:  [105-110] 110 (01/12 0410) Resp:  [16-17] 17 (01/12 0410) BP: (133-143)/(77-81) 143/81 (01/12 0410) SpO2:  [92 %-97 %] 97 % (01/12 0410)  Intake/Output from previous day: 01/11 0701 - 01/12 0700 In: 240 [P.O.:240] Out: -  Intake/Output this shift: No intake/output data recorded.   Recent Labs  11/03/16 1316  HGB 11.3*    Recent Labs  11/03/16 1316  WBC 7.7  RBC 3.63*  HCT 34.2*  PLT 394    Recent Labs  11/04/16 0442 11/05/16 0527  NA 136 138  K 3.9 3.9  CL 104 107  CO2 25 24  BUN 6 5*  CREATININE 0.76 0.66  GLUCOSE 105* 122*  CALCIUM 8.5* 8.4*   No results for input(s): LABPT, INR in the last 72 hours.  Sensation intact distally Intact pulses distally Incision: dressing C/D/I Compartment soft  Assessment/Plan: 3 Days Post-Op Procedure(s) (LRB): OPEN REDUCTION INTERNAL FIXATION (ORIF) LEFT PILON FRACTURE (Left) REMOVAL EXTERNAL FIXATION ANKLE (Left) Up with therapy  Discharge to home today.  Mcarthur Rossetti 11/06/2016, 6:48 AM

## 2016-11-13 ENCOUNTER — Telehealth (INDEPENDENT_AMBULATORY_CARE_PROVIDER_SITE_OTHER): Payer: Self-pay | Admitting: Orthopaedic Surgery

## 2016-11-13 NOTE — Telephone Encounter (Signed)
ERROR

## 2016-11-18 ENCOUNTER — Ambulatory Visit (INDEPENDENT_AMBULATORY_CARE_PROVIDER_SITE_OTHER): Payer: BLUE CROSS/BLUE SHIELD

## 2016-11-18 ENCOUNTER — Ambulatory Visit (INDEPENDENT_AMBULATORY_CARE_PROVIDER_SITE_OTHER): Payer: BLUE CROSS/BLUE SHIELD | Admitting: Orthopaedic Surgery

## 2016-11-18 DIAGNOSIS — S82402B Unspecified fracture of shaft of left fibula, initial encounter for open fracture type I or II: Secondary | ICD-10-CM

## 2016-11-18 DIAGNOSIS — S82202B Unspecified fracture of shaft of left tibia, initial encounter for open fracture type I or II: Secondary | ICD-10-CM

## 2016-11-18 DIAGNOSIS — M25572 Pain in left ankle and joints of left foot: Secondary | ICD-10-CM

## 2016-11-18 DIAGNOSIS — S82872S Displaced pilon fracture of left tibia, sequela: Secondary | ICD-10-CM

## 2016-11-18 DIAGNOSIS — M1611 Unilateral primary osteoarthritis, right hip: Secondary | ICD-10-CM

## 2016-11-18 MED ORDER — MUPIROCIN 2 % EX OINT: TOPICAL_OINTMENT | Freq: Every day | CUTANEOUS | Status: DC

## 2016-11-18 MED ORDER — SULFAMETHOXAZOLE-TRIMETHOPRIM 800-160 MG PO TABS: 1.0000 | ORAL_TABLET | Freq: Two times a day (BID) | ORAL | 0 refills | Status: DC

## 2016-11-18 MED ORDER — HYDROCODONE-ACETAMINOPHEN 5-325 MG PO TABS: 1.0000 | ORAL_TABLET | Freq: Three times a day (TID) | ORAL | 0 refills | Status: DC | PRN

## 2016-11-18 NOTE — Progress Notes (Signed)
   the patient is now 2 weeks out from a crushing injury to her left ankle. She underwent open reduction and fixation of a severely comminuted open distal tibia and fibula fracture. She is here for first postoperative visit. She is doing well.  On examination not ready remove the sutures yet. She has some slight drainage but it does not appear to be purulent. X-rays of the ankle are reviewed and show the hardware to be intact and ankle to be aligned adequately.  We'll have her take Bactrim DS twice daily as well as placement mupirocin ointment on her wounds daily after she gets somewhat the shower. She remained nonweightbearing on that side. I'll see her back in a week for suture removal and wound check. No x-rays needed at that visit. I did refill her hydrocodone. I will give her a note to resume half-day work duties starting Monday per her request.

## 2016-11-22 ENCOUNTER — Other Ambulatory Visit (INDEPENDENT_AMBULATORY_CARE_PROVIDER_SITE_OTHER): Payer: Self-pay | Admitting: Orthopaedic Surgery

## 2016-11-23 NOTE — Telephone Encounter (Signed)
Please advise 

## 2016-11-26 ENCOUNTER — Other Ambulatory Visit (INDEPENDENT_AMBULATORY_CARE_PROVIDER_SITE_OTHER): Payer: Self-pay | Admitting: Orthopaedic Surgery

## 2016-11-26 ENCOUNTER — Ambulatory Visit (INDEPENDENT_AMBULATORY_CARE_PROVIDER_SITE_OTHER): Payer: BLUE CROSS/BLUE SHIELD | Admitting: Orthopaedic Surgery

## 2016-11-26 DIAGNOSIS — S82872S Displaced pilon fracture of left tibia, sequela: Secondary | ICD-10-CM

## 2016-11-26 MED ORDER — FLUCONAZOLE 150 MG PO TABS: 150.0000 mg | ORAL_TABLET | Freq: Every day | ORAL | 0 refills | Status: DC

## 2016-11-26 NOTE — Telephone Encounter (Signed)
Please advise 

## 2016-11-26 NOTE — Progress Notes (Signed)
The patient is now about 3 weeks status post open reduction and fixation of complex left ankle [place] fracture. She is here today for wound check and suture removal. She is doing well. She does request a one-time prescription for Diflucan [place] infection from the and box without are all. Her if on examination her incisions look good removed all the sutures. There is one wound. That I would treat daily with Bactroban ointment. She does not need anymore antibiotics at this point.  From have her cam walker but nonweightbearing. She can come out of the nail Cam Walker to work on range of motion of her ankle but will remain nonweightbearing. I like see her back in 4 weeks with a repeat 3 views of her left ankle

## 2016-11-27 ENCOUNTER — Telehealth (INDEPENDENT_AMBULATORY_CARE_PROVIDER_SITE_OTHER): Payer: Self-pay

## 2016-11-27 NOTE — Telephone Encounter (Signed)
Patient is wondering if she can just wrap her ankle with ACE well at night. She states the boot is rubbing her ankle and giving her incredible pain at night

## 2016-11-27 NOTE — Telephone Encounter (Signed)
Patient aware of the below message  

## 2016-11-27 NOTE — Telephone Encounter (Signed)
At this point, this far out from her surgery and injury, she needs to just treat it with the ointment once daily.

## 2016-11-27 NOTE — Telephone Encounter (Signed)
Yes, she only needs to wear the boot when she is up.  Doesn't have to sleep in it.

## 2016-11-27 NOTE — Telephone Encounter (Signed)
Patient called stating that she had her stitches removed on yesterday and stated that incision split open a little bit, stated she has been using ointment and gauze to put on incision, but wanted to know if she needed to be seen to have a steri strip put on incision.  Please advise. Thank You.

## 2016-12-23 ENCOUNTER — Other Ambulatory Visit (INDEPENDENT_AMBULATORY_CARE_PROVIDER_SITE_OTHER): Payer: Self-pay | Admitting: Orthopaedic Surgery

## 2016-12-23 NOTE — Telephone Encounter (Signed)
Please advise 

## 2016-12-24 ENCOUNTER — Ambulatory Visit (INDEPENDENT_AMBULATORY_CARE_PROVIDER_SITE_OTHER): Payer: Self-pay

## 2016-12-24 ENCOUNTER — Ambulatory Visit (INDEPENDENT_AMBULATORY_CARE_PROVIDER_SITE_OTHER): Payer: BLUE CROSS/BLUE SHIELD | Admitting: Orthopaedic Surgery

## 2016-12-24 DIAGNOSIS — M25572 Pain in left ankle and joints of left foot: Secondary | ICD-10-CM

## 2016-12-24 DIAGNOSIS — S82872S Displaced pilon fracture of left tibia, sequela: Secondary | ICD-10-CM | POA: Diagnosis not present

## 2016-12-24 NOTE — Progress Notes (Signed)
The patient is now 51 days status post open reduction and fixation of a complex left open [place] fracture. She's were using a bone stimulator and is been nonweightbearing using a cam walker just for support. She's been working on range of motion of her ankle. She's been working on getting the small wounds on her ankle to heal as well.  On examination the wounds are healing nicely. His notes infection. There still eschar on the anterior tibia. She has full plantarflexion to about neutral dorsiflexion of her left ankle. The swelling is diminished. There is no redness. 3 views of her left ankle are obtained and show reabsorption of the bone graft. The fracture lines are still visible but there is no, getting features the hardware.  At this point I want her increase to 25-50% weightbearing and she'll still use her bone stimulator. She'll start back to 6 hour days starting Monday. I will like to see her back in 4 weeks with a repeat 3 views of her left ankle.

## 2017-01-21 ENCOUNTER — Ambulatory Visit (INDEPENDENT_AMBULATORY_CARE_PROVIDER_SITE_OTHER): Payer: BLUE CROSS/BLUE SHIELD

## 2017-01-21 ENCOUNTER — Encounter (INDEPENDENT_AMBULATORY_CARE_PROVIDER_SITE_OTHER): Payer: Self-pay | Admitting: Orthopaedic Surgery

## 2017-01-21 ENCOUNTER — Ambulatory Visit (INDEPENDENT_AMBULATORY_CARE_PROVIDER_SITE_OTHER): Payer: BLUE CROSS/BLUE SHIELD | Admitting: Orthopaedic Surgery

## 2017-01-21 DIAGNOSIS — Z09 Encounter for follow-up examination after completed treatment for conditions other than malignant neoplasm: Secondary | ICD-10-CM

## 2017-01-21 NOTE — Progress Notes (Signed)
The patient is following up at almost 3 months status post open reduction internal fixation of a complex left ankle. Fracture. She's been only 25% weightbearing and using a bone stimulator as well.  On exam a short left ankle the wounds are looking much better overall. She tolerates me putting her left knee and left ankle through gentle range of motion. The lateral fibular plate is definitely prominent.  X-rays of the left ankle show slight interval healing and no, getting features. The fracture is not healed yet.  At this point I'll let her begin weightbearing as tolerated a left ankle. I'll see her back in a month with a repeat 3 views of her left ankle.

## 2017-01-26 ENCOUNTER — Telehealth (INDEPENDENT_AMBULATORY_CARE_PROVIDER_SITE_OTHER): Payer: Self-pay | Admitting: *Deleted

## 2017-01-26 NOTE — Telephone Encounter (Signed)
Pt calling asking for return to work not dated 3/19. No restrictions. Fax: [phone] attn: Haynes Hoehn

## 2017-01-27 ENCOUNTER — Encounter (INDEPENDENT_AMBULATORY_CARE_PROVIDER_SITE_OTHER): Payer: Self-pay

## 2017-01-27 NOTE — Telephone Encounter (Signed)
Sent to number provided

## 2017-02-18 ENCOUNTER — Ambulatory Visit (INDEPENDENT_AMBULATORY_CARE_PROVIDER_SITE_OTHER): Payer: Self-pay

## 2017-02-18 ENCOUNTER — Ambulatory Visit (INDEPENDENT_AMBULATORY_CARE_PROVIDER_SITE_OTHER): Payer: BLUE CROSS/BLUE SHIELD | Admitting: Orthopaedic Surgery

## 2017-02-18 DIAGNOSIS — S82872S Displaced pilon fracture of left tibia, sequela: Secondary | ICD-10-CM

## 2017-02-18 DIAGNOSIS — M25572 Pain in left ankle and joints of left foot: Secondary | ICD-10-CM

## 2017-02-18 NOTE — Progress Notes (Signed)
[name] Carr is following up from a severe injury to her left ankle. She had a significant open comminuted fracture of her distal tibia and her fibula that occurred January 1 of this year. At first we performed external fixation and washing out the wound. We then took her to the operating room on January 9 and remove the external fixation and performed open reduction internal fixation of the fracture. This with para-articular plating. At her last visit the alignment of the ankle looked good and I want her to begin some weightbearing to see if we can stimulate more healing. She comes in today for relation treatment.  On exam clinically it does look like she is starting to develop some varus malalignment. The wounds himself are healing nicely and looked great. She's got good range of motion of her ankle and an excellent disposition in terms of dealing with pain.  X-rays were obtained of her ankle which were 3 views and I'm concerned that he still see fracture lines visible but it does appear that she is following in a more varus and fatiguing the plate.  At this point I will have her back off joint 25% weightbearing and get back in her cam walker. I will see her back in 3 weeks for repeat 3 views of her left ankle. We will likely need to set her up for additional surgery. She understands this as well.

## 2017-03-11 ENCOUNTER — Ambulatory Visit (INDEPENDENT_AMBULATORY_CARE_PROVIDER_SITE_OTHER): Payer: BLUE CROSS/BLUE SHIELD | Admitting: Orthopaedic Surgery

## 2017-03-11 ENCOUNTER — Ambulatory Visit (INDEPENDENT_AMBULATORY_CARE_PROVIDER_SITE_OTHER): Payer: BLUE CROSS/BLUE SHIELD

## 2017-03-11 DIAGNOSIS — S82872K Displaced pilon fracture of left tibia, subsequent encounter for closed fracture with nonunion: Secondary | ICD-10-CM | POA: Diagnosis not present

## 2017-03-11 DIAGNOSIS — M25572 Pain in left ankle and joints of left foot: Secondary | ICD-10-CM | POA: Diagnosis not present

## 2017-03-11 DIAGNOSIS — S82872S Displaced pilon fracture of left tibia, sequela: Secondary | ICD-10-CM | POA: Diagnosis not present

## 2017-03-11 NOTE — Progress Notes (Signed)
[name] Carr is well-known to me. She is now 4 months status post a reduction and fixation of a complexed left ankle pilon fracture. She had open reduction-internal fixation of the tibia and the fibula with bone grafting as well. With been concerned about a nonunion. She's tried bone stimulator as well. She is a diabetic but under excellent control. Her last hemoglobin A1c was below 6.  On examination her ankle is falling into some prodromal bottom as well as varus malalignment. 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 The hardware itself is starting to feel as well with bending of the tibial plate.  We went over the x-rays together and she does need to have further surgery on his ankle. I think the alignment needs to be improved as well as bone grafting taking out the hardware this and they're and revising the hardware. This is been B long difficult course and she understands as well. We'll have her continue her bone stimulation. All questions were encouraged and answered. I talked in detail about the risk and benefits of the surgery. We will work on getting this set up in the near future. I will see her back in 2 weeks postoperative with a repeat 3 views of the left ankle.

## 2017-03-19 ENCOUNTER — Encounter (HOSPITAL_COMMUNITY): Payer: Self-pay | Admitting: *Deleted

## 2017-03-19 NOTE — Pre-Procedure Instructions (Signed)
    [name] Carr  03/19/2017      [facility] [address], [place] [address] Phone: [phone] Fax: [phone]  [id number] - [place], [place] AT [address] [place] [address] Phone: [phone] Fax: [phone]    Your procedure is scheduled on May 29th, Tuesday   Report to [facility] Admitting at 2:30 pm   Call this number if you have problems the morning of surgery:  [phone]   Remember:  Do not eat food or drink liquids after midnight Monday.  Take these medicines the morning of surgery with A SIP OF WATER : Xanax, Celexa.  Use the inhaler   Do not wear jewelry, make-up or nail polish.  Do not wear lotions, powders, or perfumes, or deoderant.  Do not shave 48 hours prior to surgery.  Men may shave face and neck.  Do not bring valuables to the hospital.  [facility] is not responsible for any belongings or valuables.  Contacts, dentures or bridgework may not be worn into surgery.  Leave your suitcase in the car.  After surgery it may be brought to your room.  For patients admitted to the hospital, discharge time will be determined by your treatment team.  Patients discharged the day of surgery will not be allowed to drive home.

## 2017-03-19 NOTE — Progress Notes (Signed)
PCP is @ [facility] in [place].  Had physical last week. A1C was 5.8  03/12/2017.  Takes no meds.  More diet controlled now. She stated that either 5-10 yrs ago, she did have Echo & Stress tests done, as she was having tachycardia & htn.  Test results neg.  It was determined that the problems were due to her birth control pills. Hasn't been back to cardio, denies any current problems.

## 2017-03-23 ENCOUNTER — Other Ambulatory Visit (INDEPENDENT_AMBULATORY_CARE_PROVIDER_SITE_OTHER): Payer: Self-pay | Admitting: Physician Assistant

## 2017-03-23 ENCOUNTER — Inpatient Hospital Stay (HOSPITAL_COMMUNITY): Payer: BLUE CROSS/BLUE SHIELD

## 2017-03-23 ENCOUNTER — Inpatient Hospital Stay (HOSPITAL_COMMUNITY): Payer: BLUE CROSS/BLUE SHIELD | Admitting: Certified Registered Nurse Anesthetist

## 2017-03-23 ENCOUNTER — Encounter (HOSPITAL_COMMUNITY): Payer: Self-pay | Admitting: Certified Registered Nurse Anesthetist

## 2017-03-23 ENCOUNTER — Encounter (HOSPITAL_COMMUNITY)
Admission: RE | Disposition: A | Discharge status: Home / Self Care | Payer: Self-pay | Source: Ambulatory Visit | Attending: Orthopaedic Surgery

## 2017-03-23 ENCOUNTER — Inpatient Hospital Stay (HOSPITAL_COMMUNITY)
Admission: RE | Admit: 2017-03-23 | Discharge: 2017-03-24 | DRG: 494 | Disposition: A | Discharge status: Home / Self Care | Payer: BLUE CROSS/BLUE SHIELD | Source: Ambulatory Visit | Attending: Orthopaedic Surgery | Admitting: Orthopaedic Surgery

## 2017-03-23 DIAGNOSIS — W1831XD Fall on same level due to stepping on an object, subsequent encounter: Secondary | ICD-10-CM

## 2017-03-23 DIAGNOSIS — Z91048 Other nonmedicinal substance allergy status: Secondary | ICD-10-CM | POA: Diagnosis not present

## 2017-03-23 DIAGNOSIS — F419 Anxiety disorder, unspecified: Secondary | ICD-10-CM | POA: Diagnosis present

## 2017-03-23 DIAGNOSIS — Z881 Allergy status to other antibiotic agents status: Secondary | ICD-10-CM

## 2017-03-23 DIAGNOSIS — Z885 Allergy status to narcotic agent status: Secondary | ICD-10-CM | POA: Diagnosis not present

## 2017-03-23 DIAGNOSIS — R7303 Prediabetes: Secondary | ICD-10-CM | POA: Diagnosis present

## 2017-03-23 DIAGNOSIS — Z419 Encounter for procedure for purposes other than remedying health state, unspecified: Secondary | ICD-10-CM

## 2017-03-23 DIAGNOSIS — S82872K Displaced pilon fracture of left tibia, subsequent encounter for closed fracture with nonunion: Secondary | ICD-10-CM | POA: Diagnosis present

## 2017-03-23 DIAGNOSIS — S82872M Displaced pilon fracture of left tibia, subsequent encounter for open fracture type I or II with nonunion: Principal | ICD-10-CM

## 2017-03-23 DIAGNOSIS — Z91040 Latex allergy status: Secondary | ICD-10-CM | POA: Diagnosis not present

## 2017-03-23 DIAGNOSIS — J45909 Unspecified asthma, uncomplicated: Secondary | ICD-10-CM | POA: Diagnosis present

## 2017-03-23 DIAGNOSIS — Z87891 Personal history of nicotine dependence: Secondary | ICD-10-CM

## 2017-03-23 HISTORY — PX: HARDWARE REMOVAL: SHX979

## 2017-03-23 HISTORY — DX: Prediabetes: R73.03

## 2017-03-23 HISTORY — PX: ORIF ANKLE FRACTURE: SHX5408

## 2017-03-23 LAB — BASIC METABOLIC PANEL
Anion gap: 8 (ref 5–15)
BUN: 8 mg/dL (ref 6–20)
CALCIUM: 8.8 mg/dL — AB (ref 8.9–10.3)
CHLORIDE: 108 mmol/L (ref 101–111)
CO2: 23 mmol/L (ref 22–32)
CREATININE: 0.71 mg/dL (ref 0.44–1.00)
GFR calc Af Amer: >60 mL/min (ref >60)
GFR calc non Af Amer: >60 mL/min (ref >60)
GLUCOSE: 95 mg/dL (ref 65–99)
Potassium: 3.7 mmol/L (ref 3.5–5.1)
Sodium: 139 mmol/L (ref 135–145)

## 2017-03-23 LAB — CBC
HCT: 38.4 % (ref 36.0–46.0)
Hemoglobin: 12.7 g/dL (ref 12.0–15.0)
MCH: 29.5 pg (ref 26.0–34.0)
MCHC: 33.1 g/dL (ref 30.0–36.0)
MCV: 89.3 fL (ref 78.0–100.0)
PLATELETS: 342 10*3/uL (ref 150–400)
RBC: 4.3 MIL/uL (ref 3.87–5.11)
RDW: 13.4 % (ref 11.5–15.5)
WBC: 7.7 10*3/uL (ref 4.0–10.5)

## 2017-03-23 LAB — HCG, SERUM, QUALITATIVE: PREG SERUM: NEGATIVE

## 2017-03-23 LAB — GLUCOSE, CAPILLARY: Glucose-Capillary: 136 mg/dL — ABNORMAL HIGH (ref 65–99)

## 2017-03-23 SURGERY — OPEN REDUCTION INTERNAL FIXATION (ORIF) ANKLE FRACTURE
Anesthesia: General | Site: Ankle | Laterality: Left

## 2017-03-23 MED ORDER — SODIUM CHLORIDE 0.9 % IV SOLN
INTRAVENOUS | Status: DC
  Administered 2017-03-23: 22:00:00 via INTRAVENOUS

## 2017-03-23 MED ORDER — MEPERIDINE HCL 25 MG/ML IJ SOLN: 6.2500 mg | INTRAMUSCULAR | Status: DC | PRN

## 2017-03-23 MED ORDER — METHOCARBAMOL 500 MG PO TABS
500.0000 mg | ORAL_TABLET | Freq: Four times a day (QID) | ORAL | Status: DC | PRN
  Administered 2017-03-23 – 2017-03-24 (×2): 500 mg via ORAL
  Filled 2017-03-23: qty 1

## 2017-03-23 MED ORDER — ROPIVACAINE HCL 5 MG/ML IJ SOLN
INTRAMUSCULAR | Status: DC | PRN
  Administered 2017-03-23: 20 mL via PERINEURAL

## 2017-03-23 MED ORDER — ALBUTEROL SULFATE HFA 108 (90 BASE) MCG/ACT IN AERS: 2.0000 | INHALATION_SPRAY | Freq: Four times a day (QID) | RESPIRATORY_TRACT | Status: DC | PRN

## 2017-03-23 MED ORDER — HYDROMORPHONE HCL 1 MG/ML IJ SOLN: 1.0000 mg | INTRAMUSCULAR | Status: DC | PRN

## 2017-03-23 MED ORDER — ACETAMINOPHEN 325 MG PO TABS: 650.0000 mg | ORAL_TABLET | Freq: Four times a day (QID) | ORAL | Status: DC | PRN

## 2017-03-23 MED ORDER — CITALOPRAM HYDROBROMIDE 20 MG PO TABS
40.0000 mg | ORAL_TABLET | Freq: Every day | ORAL | Status: DC
  Administered 2017-03-23: 40 mg via ORAL
  Filled 2017-03-23: qty 2

## 2017-03-23 MED ORDER — ONDANSETRON HCL 4 MG PO TABS: 4.0000 mg | ORAL_TABLET | Freq: Four times a day (QID) | ORAL | Status: DC | PRN

## 2017-03-23 MED ORDER — CHLORHEXIDINE GLUCONATE 4 % EX LIQD: 60.0000 mL | Freq: Once | CUTANEOUS | Status: DC

## 2017-03-23 MED ORDER — FENTANYL CITRATE (PF) 100 MCG/2ML IJ SOLN
INTRAMUSCULAR | Status: AC
  Administered 2017-03-23: 100 ug via INTRAVENOUS
  Filled 2017-03-23: qty 2

## 2017-03-23 MED ORDER — CEFAZOLIN SODIUM-DEXTROSE 1-4 GM/50ML-% IV SOLN
1.0000 g | Freq: Four times a day (QID) | INTRAVENOUS | Status: DC
  Administered 2017-03-23 – 2017-03-24 (×2): 1 g via INTRAVENOUS
  Filled 2017-03-23 (×3): qty 50

## 2017-03-23 MED ORDER — ALPRAZOLAM 0.5 MG PO TABS
0.5000 mg | ORAL_TABLET | Freq: Every day | ORAL | Status: DC
  Administered 2017-03-23: 0.5 mg via ORAL
  Filled 2017-03-23: qty 1

## 2017-03-23 MED ORDER — ONDANSETRON HCL 4 MG/2ML IJ SOLN
INTRAMUSCULAR | Status: DC | PRN
  Administered 2017-03-23: 4 mg via INTRAVENOUS

## 2017-03-23 MED ORDER — METHOCARBAMOL 1000 MG/10ML IJ SOLN: 500.0000 mg | Freq: Four times a day (QID) | INTRAMUSCULAR | Status: DC | PRN

## 2017-03-23 MED ORDER — OXYCODONE HCL 5 MG PO TABS
ORAL_TABLET | ORAL | Status: AC
  Filled 2017-03-23: qty 2

## 2017-03-23 MED ORDER — CEFAZOLIN SODIUM-DEXTROSE 2-4 GM/100ML-% IV SOLN
2.0000 g | INTRAVENOUS | Status: AC
  Administered 2017-03-23: 2 g via INTRAVENOUS
  Filled 2017-03-23: qty 100

## 2017-03-23 MED ORDER — DEXAMETHASONE SODIUM PHOSPHATE 10 MG/ML IJ SOLN
INTRAMUSCULAR | Status: AC
  Filled 2017-03-23: qty 1

## 2017-03-23 MED ORDER — PROPOFOL 10 MG/ML IV BOLUS
INTRAVENOUS | Status: AC
  Filled 2017-03-23: qty 40

## 2017-03-23 MED ORDER — FENTANYL CITRATE (PF) 100 MCG/2ML IJ SOLN
INTRAMUSCULAR | Status: DC | PRN
  Administered 2017-03-23: 50 ug via INTRAVENOUS
  Administered 2017-03-23: 25 ug via INTRAVENOUS
  Administered 2017-03-23: 50 ug via INTRAVENOUS
  Administered 2017-03-23: 25 ug via INTRAVENOUS

## 2017-03-23 MED ORDER — PROPOFOL 1000 MG/100ML IV EMUL
INTRAVENOUS | Status: AC
  Filled 2017-03-23: qty 100

## 2017-03-23 MED ORDER — MONTELUKAST SODIUM 10 MG PO TABS
10.0000 mg | ORAL_TABLET | Freq: Every day | ORAL | Status: DC
  Administered 2017-03-23: 10 mg via ORAL
  Filled 2017-03-23: qty 1

## 2017-03-23 MED ORDER — DIPHENHYDRAMINE HCL 50 MG/ML IJ SOLN
25.0000 mg | Freq: Once | INTRAMUSCULAR | Status: AC
  Administered 2017-03-23: 25 mg via INTRAVENOUS
  Filled 2017-03-23: qty 0.5

## 2017-03-23 MED ORDER — MIDAZOLAM HCL 2 MG/2ML IJ SOLN
INTRAMUSCULAR | Status: AC
  Administered 2017-03-23: 2 mg via INTRAVENOUS
  Filled 2017-03-23: qty 2

## 2017-03-23 MED ORDER — LIDOCAINE 2% (20 MG/ML) 5 ML SYRINGE
INTRAMUSCULAR | Status: AC
  Filled 2017-03-23: qty 5

## 2017-03-23 MED ORDER — OXYCODONE HCL 5 MG PO TABS
5.0000 mg | ORAL_TABLET | ORAL | Status: DC | PRN
  Administered 2017-03-23 – 2017-03-24 (×5): 10 mg via ORAL
  Filled 2017-03-23 (×4): qty 2

## 2017-03-23 MED ORDER — LACTATED RINGERS IV SOLN
INTRAVENOUS | Status: DC
  Administered 2017-03-23 (×2): via INTRAVENOUS

## 2017-03-23 MED ORDER — DIPHENHYDRAMINE HCL 50 MG/ML IJ SOLN
INTRAMUSCULAR | Status: AC
  Administered 2017-03-23: 25 mg via INTRAVENOUS
  Filled 2017-03-23: qty 1

## 2017-03-23 MED ORDER — ONDANSETRON HCL 4 MG/2ML IJ SOLN: 4.0000 mg | Freq: Four times a day (QID) | INTRAMUSCULAR | Status: DC | PRN

## 2017-03-23 MED ORDER — DEXAMETHASONE SODIUM PHOSPHATE 10 MG/ML IJ SOLN
INTRAMUSCULAR | Status: DC | PRN
  Administered 2017-03-23: 10 mg via INTRAVENOUS

## 2017-03-23 MED ORDER — FENTANYL CITRATE (PF) 250 MCG/5ML IJ SOLN
INTRAMUSCULAR | Status: AC
  Filled 2017-03-23: qty 5

## 2017-03-23 MED ORDER — ALBUTEROL SULFATE (2.5 MG/3ML) 0.083% IN NEBU: 2.5000 mg | INHALATION_SOLUTION | Freq: Four times a day (QID) | RESPIRATORY_TRACT | Status: DC | PRN

## 2017-03-23 MED ORDER — 0.9 % SODIUM CHLORIDE (POUR BTL) OPTIME
TOPICAL | Status: DC | PRN
  Administered 2017-03-23: 1000 mL

## 2017-03-23 MED ORDER — PROPOFOL 10 MG/ML IV BOLUS
INTRAVENOUS | Status: DC | PRN
Start: 2017-03-23 — End: 2017-03-23
  Administered 2017-03-23: 100 mg via INTRAVENOUS
  Administered 2017-03-23: 200 mg via INTRAVENOUS
  Administered 2017-03-23: 100 mg via INTRAVENOUS

## 2017-03-23 MED ORDER — LIDOCAINE 2% (20 MG/ML) 5 ML SYRINGE
INTRAMUSCULAR | Status: DC | PRN
  Administered 2017-03-23: 20 mg via INTRAVENOUS

## 2017-03-23 MED ORDER — ACETAMINOPHEN 650 MG RE SUPP: 650.0000 mg | Freq: Four times a day (QID) | RECTAL | Status: DC | PRN

## 2017-03-23 MED ORDER — METHOCARBAMOL 500 MG PO TABS
ORAL_TABLET | ORAL | Status: AC
  Filled 2017-03-23: qty 1

## 2017-03-23 MED ORDER — ONDANSETRON HCL 4 MG/2ML IJ SOLN
INTRAMUSCULAR | Status: AC
  Filled 2017-03-23: qty 2

## 2017-03-23 MED ORDER — METOCLOPRAMIDE HCL 5 MG PO TABS: 5.0000 mg | ORAL_TABLET | Freq: Three times a day (TID) | ORAL | Status: DC | PRN

## 2017-03-23 MED ORDER — METOCLOPRAMIDE HCL 5 MG/ML IJ SOLN: 5.0000 mg | Freq: Three times a day (TID) | INTRAMUSCULAR | Status: DC | PRN

## 2017-03-23 MED ORDER — EPHEDRINE SULFATE-NACL 50-0.9 MG/10ML-% IV SOSY
PREFILLED_SYRINGE | INTRAVENOUS | Status: DC | PRN
  Administered 2017-03-23: 10 mg via INTRAVENOUS

## 2017-03-23 MED ORDER — FENTANYL CITRATE (PF) 100 MCG/2ML IJ SOLN
100.0000 ug | Freq: Once | INTRAMUSCULAR | Status: AC
  Administered 2017-03-23: 100 ug via INTRAVENOUS

## 2017-03-23 MED ORDER — PROMETHAZINE HCL 25 MG/ML IJ SOLN
6.2500 mg | INTRAMUSCULAR | Status: DC | PRN
Start: 2017-03-23 — End: 2017-03-23

## 2017-03-23 MED ORDER — MIDAZOLAM HCL 2 MG/2ML IJ SOLN
2.0000 mg | Freq: Once | INTRAMUSCULAR | Status: AC
  Administered 2017-03-23: 2 mg via INTRAVENOUS

## 2017-03-23 MED ORDER — LACTATED RINGERS IV SOLN: INTRAVENOUS | Status: DC

## 2017-03-23 MED ORDER — HYDROMORPHONE HCL 1 MG/ML IJ SOLN: 0.2500 mg | INTRAMUSCULAR | Status: DC | PRN

## 2017-03-23 MED ORDER — DIPHENHYDRAMINE HCL 12.5 MG/5ML PO ELIX
12.5000 mg | ORAL_SOLUTION | ORAL | Status: DC | PRN
Start: 2017-03-23 — End: 2017-03-24

## 2017-03-23 MED ORDER — BUPIVACAINE-EPINEPHRINE (PF) 0.5% -1:200000 IJ SOLN
INTRAMUSCULAR | Status: DC | PRN
  Administered 2017-03-23: 30 mL via PERINEURAL

## 2017-03-23 SURGICAL SUPPLY — 99 items
BANDAGE ACE 4X5 VEL STRL LF (GAUZE/BANDAGES/DRESSINGS) ×2 IMPLANT
BANDAGE ACE 6X5 VEL STRL LF (GAUZE/BANDAGES/DRESSINGS) ×2 IMPLANT
BANDAGE ESMARK 6X9 LF (GAUZE/BANDAGES/DRESSINGS) IMPLANT
BIT DRILL 110X2.5XQCK CNCT (BIT) IMPLANT
BIT DRILL 2.5 (BIT) ×3
BIT DRILL 2.7 (BIT) ×1
BIT DRILL 2.7MM (BIT) IMPLANT
BIT DRILL STD 2.0MM (DRILL) IMPLANT
BIT DRL 110X2.5XQCK CNCT (BIT) ×1
BNDG CMPR 9X6 STRL LF SNTH (GAUZE/BANDAGES/DRESSINGS)
BNDG COHESIVE 4X5 TAN STRL (GAUZE/BANDAGES/DRESSINGS) IMPLANT
BNDG ESMARK 6X9 LF (GAUZE/BANDAGES/DRESSINGS)
BNDG GAUZE ELAST 4 BULKY (GAUZE/BANDAGES/DRESSINGS) ×3 IMPLANT
BONE CANC CHIPS 40CC CAN1/2 (Bone Implant) ×3 IMPLANT
CHIPS CANC BONE 40CC CAN1/2 (Bone Implant) ×1 IMPLANT
COVER SURGICAL LIGHT HANDLE (MISCELLANEOUS) ×3 IMPLANT
CUFF TOURNIQUET SINGLE 34IN LL (TOURNIQUET CUFF) ×2 IMPLANT
CUFF TOURNIQUET SINGLE 44IN (TOURNIQUET CUFF) IMPLANT
DRAPE C-ARM 42X72 X-RAY (DRAPES) ×3 IMPLANT
DRAPE C-ARMOR (DRAPES) ×2 IMPLANT
DRAPE HALF SHEET 40X57 (DRAPES) ×6 IMPLANT
DRAPE INCISE IOBAN 66X45 STRL (DRAPES) IMPLANT
DRAPE ORTHO SPLIT 77X108 STRL (DRAPES)
DRAPE SURG ORHT 6 SPLT 77X108 (DRAPES) IMPLANT
DRAPE U-SHAPE 47X51 STRL (DRAPES) ×3 IMPLANT
DRILL BIT 2.7MM (BIT) ×3
DRILL STANDARD 2.0MM (DRILL) ×6
DRSG EMULSION OIL 3X3 NADH (GAUZE/BANDAGES/DRESSINGS) ×3 IMPLANT
DRSG PAD ABDOMINAL 8X10 ST (GAUZE/BANDAGES/DRESSINGS) ×5 IMPLANT
DURAPREP 26ML APPLICATOR (WOUND CARE) ×3 IMPLANT
ELECT REM PT RETURN 9FT ADLT (ELECTROSURGICAL) ×3
ELECTRODE REM PT RTRN 9FT ADLT (ELECTROSURGICAL) ×1 IMPLANT
GAUZE SPONGE 4X4 12PLY STRL (GAUZE/BANDAGES/DRESSINGS) ×3 IMPLANT
GAUZE XEROFORM 1X8 LF (GAUZE/BANDAGES/DRESSINGS) ×2 IMPLANT
GAUZE XEROFORM 5X9 LF (GAUZE/BANDAGES/DRESSINGS) ×3 IMPLANT
GLOVE BIO SURGEON STRL SZ8 (GLOVE) ×3 IMPLANT
GLOVE BIOGEL PI IND STRL 7.0 (GLOVE) IMPLANT
GLOVE BIOGEL PI IND STRL 8 (GLOVE) ×1 IMPLANT
GLOVE BIOGEL PI INDICATOR 7.0 (GLOVE) ×6
GLOVE BIOGEL PI INDICATOR 8 (GLOVE) ×2
GLOVE ORTHO TXT STRL SZ7.5 (GLOVE) ×3 IMPLANT
GLOVE SURG SS PI 6.5 STRL IVOR (GLOVE) ×6 IMPLANT
GLOVE SURG SS PI 7.0 STRL IVOR (GLOVE) ×2 IMPLANT
GOWN STRL REUS W/ TWL LRG LVL3 (GOWN DISPOSABLE) ×3 IMPLANT
GOWN STRL REUS W/ TWL XL LVL3 (GOWN DISPOSABLE) ×4 IMPLANT
GOWN STRL REUS W/TWL LRG LVL3 (GOWN DISPOSABLE) ×9
GOWN STRL REUS W/TWL XL LVL3 (GOWN DISPOSABLE) ×12
GRAFT BNE CHIP CANC 1-8 40 (Bone Implant) IMPLANT
KIT BASIN OR (CUSTOM PROCEDURE TRAY) ×3 IMPLANT
KIT INFUSE LRG II (Orthopedic Implant) ×2 IMPLANT
KIT ROOM TURNOVER OR (KITS) ×3 IMPLANT
MANIFOLD NEPTUNE II (INSTRUMENTS) ×3 IMPLANT
NDL HYPO 25GX1X1/2 BEV (NEEDLE) IMPLANT
NEEDLE HYPO 25GX1X1/2 BEV (NEEDLE) IMPLANT
NS IRRIG 1000ML POUR BTL (IV SOLUTION) ×3 IMPLANT
PACK GENERAL/GYN (CUSTOM PROCEDURE TRAY) ×3 IMPLANT
PACK ORTHO EXTREMITY (CUSTOM PROCEDURE TRAY) ×3 IMPLANT
PAD ARMBOARD 7.5X6 YLW CONV (MISCELLANEOUS) ×6 IMPLANT
PAD CAST 4YDX4 CTTN HI CHSV (CAST SUPPLIES) ×2 IMPLANT
PADDING CAST COTTON 4X4 STRL (CAST SUPPLIES) ×3
PADDING CAST COTTON 6X4 STRL (CAST SUPPLIES) ×3 IMPLANT
PLATE BONE LOCKING 8H 144MM (Plate) ×2 IMPLANT
PLATE VERTUBULAR LOCK 6HOLE (Plate) ×2 IMPLANT
PREFILTER NEPTUNE (MISCELLANEOUS) ×3 IMPLANT
PUTTY DBM STAGRAFT PLUS 10CC (Putty) ×2 IMPLANT
SCREW CORT 2.5X20X3.5XST SM (Screw) IMPLANT
SCREW CORT S/T 3.5X22 (Screw) ×4 IMPLANT
SCREW CORTICAL 3.5 16MM (Screw) ×2 IMPLANT
SCREW CORTICAL 3.5X12 (Screw) ×4 IMPLANT
SCREW CORTICAL 3.5X20 (Screw) ×6 IMPLANT
SCREW LOCK 12X3.5X M THRD (Screw) IMPLANT
SCREW LOCK 20X3.5X M THRD (Screw) IMPLANT
SCREW LOCKING 2.7X36 (Screw) ×4 IMPLANT
SCREW LOCKING 2.7X38 (Screw) ×2 IMPLANT
SCREW LOCKING 2.7X40 (Screw) ×2 IMPLANT
SCREW LOCKING 2.7X42 (Screw) ×4 IMPLANT
SCREW LOCKING 2.7X44 (Screw) ×2 IMPLANT
SCREW LOCKING 3.5X12 (Screw) ×9 IMPLANT
SCREW LOCKING 3.5X20MM (Screw) ×3 IMPLANT
SPONGE LAP 4X18 X RAY DECT (DISPOSABLE) ×4 IMPLANT
STAPLER VISISTAT 35W (STAPLE) ×3 IMPLANT
STOCKINETTE IMPERVIOUS 9X36 MD (GAUZE/BANDAGES/DRESSINGS) IMPLANT
SUCTION FRAZIER HANDLE 10FR (MISCELLANEOUS) ×2
SUCTION TUBE FRAZIER 10FR DISP (MISCELLANEOUS) ×1 IMPLANT
SUT ETHILON 2 0 FS 18 (SUTURE) ×9 IMPLANT
SUT ETHILON 3 0 PS 1 (SUTURE) ×6 IMPLANT
SUT ETHILON 4 0 FS 1 (SUTURE) IMPLANT
SUT VIC AB 0 CT1 27 (SUTURE)
SUT VIC AB 0 CT1 27XBRD ANBCTR (SUTURE) IMPLANT
SUT VIC AB 2-0 CT1 27 (SUTURE) ×15
SUT VIC AB 2-0 CT1 27XBRD (SUTURE) ×1 IMPLANT
SUT VIC AB 2-0 CT1 TAPERPNT 27 (SUTURE) IMPLANT
SYR CONTROL 10ML LL (SYRINGE) IMPLANT
TOWEL OR 17X24 6PK STRL BLUE (TOWEL DISPOSABLE) ×3 IMPLANT
TOWEL OR 17X26 10 PK STRL BLUE (TOWEL DISPOSABLE) ×3 IMPLANT
TUBE CONNECTING 12'X1/4 (SUCTIONS) ×1
TUBE CONNECTING 12X1/4 (SUCTIONS) ×2 IMPLANT
WATER STERILE IRR 1000ML POUR (IV SOLUTION) ×3 IMPLANT
WIRE K 1.6MM 144256 (MISCELLANEOUS) ×6 IMPLANT

## 2017-03-23 NOTE — Anesthesia Preprocedure Evaluation (Addendum)
Anesthesia Evaluation  Patient identified by MRN, date of birth, ID band Patient awake    Reviewed: Allergy & Precautions, NPO status , Patient's Chart, lab work & pertinent test results  Airway Mallampati: I  TM Distance: >3 FB Neck ROM: Full    Dental  (+) Teeth Intact   Pulmonary asthma , former smoker,    breath sounds clear to auscultation       Cardiovascular negative cardio ROS   Rhythm:Regular Rate:Normal     Neuro/Psych    GI/Hepatic negative GI ROS, Neg liver ROS,   Endo/Other  negative endocrine ROS  Renal/GU negative Renal ROS     Musculoskeletal   Abdominal   Peds  Hematology negative hematology ROS (+)   Anesthesia Other Findings   Reproductive/Obstetrics                            Anesthesia Physical Anesthesia Plan  ASA: II  Anesthesia Plan: General   Post-op Pain Management:  Regional for Post-op pain   Induction: Intravenous  Airway Management Planned: LMA  Additional Equipment:   Intra-op Plan:   Post-operative Plan: Extubation in OR  Informed Consent: I have reviewed the patients History and Physical, chart, labs and discussed the procedure including the risks, benefits and alternatives for the proposed anesthesia with the patient or authorized representative who has indicated his/her understanding and acceptance.     Plan Discussed with:   Anesthesia Plan Comments:         Anesthesia Quick Evaluation

## 2017-03-23 NOTE — H&P (Signed)
[name] Carr is an 46 y.o. female.   Chief Complaint:   Left ankle pain; known non-union of left pilon fracture HPI:   46 yo pleasant female who sustained an open left pilon fracture in January of this year.  Underwent external fixation followed by open reduction and internal fixation later once the soft-tissue allowed.  Has been followed for many months now.  At this point, xrays show mal-alignment and non-union.  Surgery has thus been recommended for bone grafting and to correct her deformity.  Past Medical History:  Diagnosis Date  . Anxiety   . Asthma   . Complication of anesthesia    one time had difficulty waking up  . Pneumonia    walking pneumonia  . Pre-diabetes    cnotrolled with diet an exercise    Past Surgical History:  Procedure Laterality Date  . BREAST SURGERY     reduction  . EXTERNAL FIXATION LEG Left 10/26/2016   Procedure: EXTERNAL FIXATION ANKLE ;  Surgeon: Mcarthur Rossetti, MD;  Location: [facility];  Service: Orthopedics;  Laterality: Left;  . EXTERNAL FIXATION REMOVAL Left 11/03/2016   Procedure: REMOVAL EXTERNAL FIXATION ANKLE;  Surgeon: Mcarthur Rossetti, MD;  Location: [place];  Service: Orthopedics;  Laterality: Left;  . IRRIGATION AND DEBRIDEMENT FOOT Left 10/26/2016   Procedure: IRRIGATION AND DEBRIDEMENT;  Surgeon: Mcarthur Rossetti, MD;  Location: [facility];  Service: Orthopedics;  Laterality: Left;  . ORIF ANKLE FRACTURE Left 11/03/2016   Procedure: OPEN REDUCTION INTERNAL FIXATION (ORIF) LEFT PILON FRACTURE;  Surgeon: Mcarthur Rossetti, MD;  Location: [facility];  Service: Orthopedics;  Laterality: Left;  . PILONIDAL CYST EXCISION      Family History  Problem Relation Age of Onset  . Breast cancer Mother   . Diabetes Mellitus II Father    Social History:  reports that she has quit smoking. She has never used smokeless tobacco. She reports that she drinks alcohol. She reports that she does not use drugs.  Allergies:  Allergies  Allergen  Reactions  . Morphine Shortness Of Breath    LIKELY DOSE RELATED  . Latex Hives, Itching and Rash  . Tetracycline Hives and Itching  . Chlorhexidine     Itching from CHG wipes in pre-op    Facility-Administered Medications Prior to Admission  Medication Dose Route Frequency Provider Last Rate Last Dose  . mupirocin ointment (BACTROBAN) 2 %   Topical Daily Mcarthur Rossetti, MD       Medications Prior to Admission  Medication Sig Dispense Refill  . adapalene (DIFFERIN) 0.1 % gel Apply 1 application topically daily as needed. Acne    . ALPRAZolam (XANAX) 0.5 MG tablet Take 0.5 mg by mouth at bedtime.     . citalopram (CELEXA) 20 MG tablet Take 40 mg by mouth at bedtime.     . DENTA 5000 PLUS 1.1 % CREA dental cream Place 1 application onto teeth 3 (three) times daily as needed. Brushes her teeth 2-3 times daily and uses each time she brushes  12  . metroNIDAZOLE (METROGEL) 0.75 % vaginal gel Place 1 Applicatorful vaginally 2 (two) times daily.    . montelukast (SINGULAIR) 10 MG tablet Take 10 mg by mouth at bedtime.    Marland Kitchen PROAIR HFA 108 (90 Base) MCG/ACT inhaler Inhale 2 puffs into the lungs every 6 (six) hours as needed.  2    Results for orders placed or performed during the hospital encounter of 03/23/17 (from the past 48 hour(s))  Basic metabolic panel     Status: Abnormal   Collection Time: 03/23/17  1:39 PM  Result Value Ref Range   Sodium 139 135 - 145 mmol/L   Potassium 3.7 3.5 - 5.1 mmol/L   Chloride 108 101 - 111 mmol/L   CO2 23 22 - 32 mmol/L   Glucose, Bld 95 65 - 99 mg/dL   BUN 8 6 - 20 mg/dL   Creatinine, Ser 0.71 0.44 - 1.00 mg/dL   Calcium 8.8 (L) 8.9 - 10.3 mg/dL   GFR calc non Af Amer >60 >60 mL/min   GFR calc Af Amer >60 >60 mL/min    Comment: (NOTE) The eGFR has been calculated using the CKD EPI equation. This calculation has not been validated in all clinical situations. eGFR's persistently <60 mL/min signify possible Chronic Kidney Disease.     Anion gap 8 5 - 15  CBC     Status: None   Collection Time: 03/23/17  1:39 PM  Result Value Ref Range   WBC 7.7 4.0 - 10.5 K/uL   RBC 4.30 3.87 - 5.11 MIL/uL   Hemoglobin 12.7 12.0 - 15.0 g/dL   HCT 38.4 36.0 - 46.0 %   MCV 89.3 78.0 - 100.0 fL   MCH 29.5 26.0 - 34.0 pg   MCHC 33.1 30.0 - 36.0 g/dL   RDW 13.4 11.5 - 15.5 %   Platelets 342 150 - 400 K/uL  hCG, serum, qualitative     Status: None   Collection Time: 03/23/17  1:39 PM  Result Value Ref Range   Preg, Serum NEGATIVE NEGATIVE    Comment:        THE SENSITIVITY OF THIS METHODOLOGY IS >10 mIU/mL.    No results found.  Review of Systems  All other systems reviewed and are negative.   Blood pressure 119/63, pulse 74, temperature 98.5 F (36.9 C), temperature source Oral, resp. rate 18, height _0  (1.626 m), weight 167 lb (75.8 kg), last menstrual period 02/21/2017, SpO2 99 %. Physical Exam  Constitutional: She is oriented to person, place, and time. She appears well-developed and well-nourished.  HENT:  Head: Normocephalic and atraumatic.  Eyes: EOM are normal. Pupils are equal, round, and reactive to light.  Neck: Normal range of motion. Neck supple.  Cardiovascular: Normal rate and regular rhythm.   Respiratory: Effort normal and breath sounds normal.  GI: Soft. Bowel sounds are normal.  Musculoskeletal:       Left ankle: She exhibits deformity. Tenderness. Lateral malleolus tenderness found.  Neurological: She is alert and oriented to person, place, and time.  Skin: Skin is warm and dry.  Psychiatric: She has a normal mood and affect.     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  Risks and benefits discussed and informed consent obtained.  Mcarthur Rossetti, MD 03/23/2017, 2:34 PM

## 2017-03-23 NOTE — Anesthesia Procedure Notes (Signed)
Anesthesia Regional Block: Adductor canal block   Pre-Anesthetic Checklist: ,, timeout performed, Correct Patient, Correct Site, Correct Laterality, Correct Procedure, Correct Position, site marked, Risks and benefits discussed,  Surgical consent,  Pre-op evaluation,  At surgeon's request and post-op pain management  Laterality: Left  Prep: Dura Prep       Needles:  Injection technique: Single-shot  Needle Type: Echogenic Needle     Needle Length: 9cm  Needle Gauge: 21     Additional Needles:   Procedures: ultrasound guided,,,,,,,,  Narrative:  Start time: 03/23/2017 3:45 PM End time: 03/23/2017 3:53 PM Injection made incrementally with aspirations every 5 mL.  Performed by: Personally  Anesthesiologist: Suzette Battiest

## 2017-03-23 NOTE — Transfer of Care (Signed)
Immediate Anesthesia Transfer of Care Note  Patient: GRACEY TOLLE  Procedure(s) Performed: Procedure(s): REMOVAL OF HARDWARE LEFT ANKLE, REVISION OPEN REDUCTION INTERNAL FIXATION (ORIF) LEFT PILON FRACTURE, BONE GRAFT LEFT TIBIA (Left) HARDWARE REMOVAL (Left)  Patient Location: PACU  Anesthesia Type:General  Level of Consciousness: awake, alert  and oriented  Airway & Oxygen Therapy: Patient Spontanous Breathing  Post-op Assessment: Report given to RN and Post -op Vital signs reviewed and stable  Post vital signs: Reviewed and stable  Last Vitals:  Vitals:   03/23/17 1607 03/23/17 1608  BP: (!) 115/51   Pulse: 69 75  Resp: 15 15  Temp:      Last Pain:  Vitals:   03/23/17 1339  TempSrc: Oral      Patients Stated Pain Goal: 5 (76/80/88 1103)  Complications: No apparent anesthesia complications

## 2017-03-23 NOTE — Anesthesia Procedure Notes (Signed)
Anesthesia Regional Block: Popliteal block   Pre-Anesthetic Checklist: ,, timeout performed, Correct Patient, Correct Site, Correct Laterality, Correct Procedure, Correct Position, site marked, Risks and benefits discussed,  Surgical consent,  Pre-op evaluation,  At surgeon's request and post-op pain management  Laterality: Left  Prep: Dura Prep       Needles:  Injection technique: Single-shot  Needle Type: Echogenic Needle     Needle Length: 9cm  Needle Gauge: 21     Additional Needles:   Procedures: ultrasound guided,,,,,,,,  Narrative:  Start time: 03/23/2017 3:54 PM End time: 03/23/2017 3:59 PM Injection made incrementally with aspirations every 5 mL.  Performed by: Personally  Anesthesiologist: Suzette Battiest

## 2017-03-23 NOTE — Progress Notes (Signed)
Patient states she is itching after using CHG, patient reports already wiping down with damp cloths. Benadryl given with relief.

## 2017-03-23 NOTE — Brief Op Note (Signed)
03/23/2017  7:25 PM  PATIENT:  [name] Carr  46 y.o. female  PRE-OPERATIVE DIAGNOSIS:  non-union left pilon fracture  POST-OPERATIVE DIAGNOSIS:  non-union left pilon fracture  PROCEDURE:  Procedure(s): REMOVAL OF HARDWARE LEFT ANKLE, REVISION OPEN REDUCTION INTERNAL FIXATION (ORIF) LEFT PILON FRACTURE, BONE GRAFT LEFT TIBIA (Left) HARDWARE REMOVAL (Left)  SURGEON:  Surgeon(s) and Role:    Mcarthur Rossetti, MD - Primary  PHYSICIAN ASSISTANT: Benita Stabile, PA-C  ANESTHESIA:   regional and general  COUNTS:  YES  TOURNIQUET:   Total Tourniquet Time Documented: Thigh (Left) - 120 minutes Total: Thigh (Left) - 120 minutes   DICTATION: .Other Dictation: Dictation Number [id number]  PLAN OF CARE: Admit to inpatient   PATIENT DISPOSITION:  PACU - hemodynamically stable.   Delay start of Pharmacological VTE agent (>24hrs) due to surgical blood loss or risk of bleeding: no

## 2017-03-24 ENCOUNTER — Telehealth (INDEPENDENT_AMBULATORY_CARE_PROVIDER_SITE_OTHER): Payer: Self-pay

## 2017-03-24 LAB — BASIC METABOLIC PANEL
ANION GAP: 9 (ref 5–15)
BUN: 8 mg/dL (ref 6–20)
CHLORIDE: 104 mmol/L (ref 101–111)
CO2: 23 mmol/L (ref 22–32)
Calcium: 8.5 mg/dL — ABNORMAL LOW (ref 8.9–10.3)
Creatinine, Ser: 0.72 mg/dL (ref 0.44–1.00)
GFR calc Af Amer: >60 mL/min (ref >60)
GLUCOSE: 168 mg/dL — AB (ref 65–99)
POTASSIUM: 4.1 mmol/L (ref 3.5–5.1)
Sodium: 136 mmol/L (ref 135–145)

## 2017-03-24 MED ORDER — OXYCODONE-ACETAMINOPHEN 5-325 MG PO TABS: 1.0000 | ORAL_TABLET | ORAL | 0 refills | Status: DC | PRN

## 2017-03-24 NOTE — Anesthesia Postprocedure Evaluation (Addendum)
Anesthesia Post Note  Patient: [name] Carr  Procedure(s) Performed: Procedure(s) (LRB): REMOVAL OF HARDWARE LEFT ANKLE, REVISION OPEN REDUCTION INTERNAL FIXATION (ORIF) LEFT PILON FRACTURE, BONE GRAFT LEFT TIBIA (Left) HARDWARE REMOVAL (Left)  Patient location during evaluation: PACU Anesthesia Type: General and Regional Level of consciousness: awake and alert Pain management: pain level controlled Vital Signs Assessment: post-procedure vital signs reviewed and stable Respiratory status: spontaneous breathing, nonlabored ventilation, respiratory function stable and patient connected to nasal cannula oxygen Cardiovascular status: blood pressure returned to baseline and stable Postop Assessment: no signs of nausea or vomiting Anesthetic complications: no       Last Vitals:  Vitals:   03/23/17 2115 03/23/17 2208  BP: 102/83 115/70  Pulse: 86 91  Resp: 14 14  Temp: 36.4 C 37.1 C    Last Pain:  Vitals:   03/23/17 2208  TempSrc: Oral  PainSc:                  Effie Berkshire

## 2017-03-24 NOTE — Discharge Summary (Signed)
Patient ID: [name] Carr MRN: [id number] DOB/AGE: 1971-04-09 45 y.o.  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 Anxiety   . Asthma   . Complication of anesthesia    one time had difficulty waking up  . Pneumonia    walking pneumonia  . Pre-diabetes    cnotrolled with diet an exercise    Surgeries: Procedure(s): REMOVAL OF HARDWARE LEFT ANKLE, REVISION OPEN REDUCTION INTERNAL FIXATION (ORIF) LEFT PILON FRACTURE, BONE GRAFT LEFT TIBIA HARDWARE REMOVAL on 03/23/2017   Consultants:   Discharged Condition: Improved  Hospital Course: [name] Carr is an 46 y.o. female who was admitted 03/23/2017 for operative treatment ofClosed displaced pilon fracture of left tibia with nonunion. Patient has severe unremitting pain that affects sleep, daily activities, and work/hobbies. After pre-op clearance the patient was taken to the operating room on 03/23/2017 and underwent  Procedure(s): REMOVAL OF HARDWARE LEFT ANKLE, REVISION OPEN REDUCTION INTERNAL FIXATION (ORIF) LEFT PILON FRACTURE, BONE GRAFT LEFT TIBIA HARDWARE REMOVAL.    Patient was given perioperative antibiotics: Anti-infectives    Start     Dose/Rate Route Frequency Ordered Stop   03/23/17 2230  ceFAZolin (ANCEF) IVPB 1 g/50 mL premix     1 g 100 mL/hr over 30 Minutes Intravenous Every 6 hours 03/23/17 2149 03/24/17 1629   03/23/17 1315  ceFAZolin (ANCEF) IVPB 2g/100 mL premix     2 g 200 mL/hr over 30 Minutes Intravenous On call to O.R. 03/23/17 1305 03/23/17 1702       Patient was given sequential compression devices, early ambulation, and chemoprophylaxis to prevent DVT.  Patient benefited maximally from hospital stay and there were no complications.    Recent vital signs: Patient Vitals for the past 24 hrs:  BP Temp Temp src  Pulse Resp SpO2 Height Weight  03/24/17 0451 134/68 98.1 F (36.7 C) Oral 71 15 100 % - -  03/23/17 2208 115/70 98.8 F (37.1 C) Oral 91 14 96 % 5\' 4"  (1.626 m) 167 lb (75.8 kg)  03/23/17 2115 102/83 97.5 F (36.4 C) - 86 14 99 % - -  03/23/17 2100 114/66 - - 85 15 97 % - -  03/23/17 2045 - - - 80 (!) 23 99 % - -  03/23/17 2030 113/70 - - 78 14 97 % - -  03/23/17 2015 101/64 - - 89 14 96 % - -  03/23/17 2000 - - - 97 20 97 % - -  03/23/17 1945 121/77 97 F (36.1 C) - 97 15 100 % - -  03/23/17 1608 - - - 75 15 99 % - -  03/23/17 1607 (!) 115/51 - - 69 15 98 % - -  03/23/17 1606 - - - 71 13 98 % - -  03/23/17 1600 131/64 - - 68 18 100 % - -  03/23/17 1555 (!) 128/54 - - 70 18 100 % - -  03/23/17 1550 131/68 - - 72 18 100 % - -  03/23/17 1339 119/63 98.5 F (36.9 C) Oral 74 18 99 % 5\' 4"  (1.626 m) 167 lb (75.8 kg)     Recent laboratory studies:  Recent Labs  03/23/17 1339 03/24/17 0348  WBC 7.7  --   HGB 12.7  --   HCT 38.4  --   PLT 342  --  NA 139 136  K 3.7 4.1  CL 108 104  CO2 23 23  BUN 8 8  CREATININE 0.71 0.72  GLUCOSE 95 168*  CALCIUM 8.8* 8.5*     Discharge Medications:   Allergies as of 03/24/2017      Reactions   Morphine Shortness Of Breath   LIKELY DOSE RELATED   Latex Hives, Itching, Rash   Tetracycline Hives, Itching   Chlorhexidine    Itching from CHG wipes in pre-op      Medication List    TAKE these medications   adapalene 0.1 % gel Commonly known as:  DIFFERIN Apply 1 application topically daily as needed. Acne   ALPRAZolam 0.5 MG tablet Commonly known as:  XANAX Take 0.5 mg by mouth at bedtime.   citalopram 20 MG tablet Commonly known as:  CELEXA Take 40 mg by mouth at bedtime.   DENTA 5000 PLUS 1.1 % Crea dental cream Generic drug:  sodium fluoride Place 1 application onto teeth 3 (three) times daily as needed. Brushes her teeth 2-3 times daily and uses each time she brushes   metroNIDAZOLE 0.75 % vaginal gel Commonly known  as:  METROGEL Place 1 Applicatorful vaginally 2 (two) times daily.   montelukast 10 MG tablet Commonly known as:  SINGULAIR Take 10 mg by mouth at bedtime.   oxyCODONE-acetaminophen 5-325 MG tablet Commonly known as:  ROXICET Take 1-2 tablets by mouth every 4 (four) hours as needed.   PROAIR HFA 108 (90 Base) MCG/ACT inhaler Generic drug:  albuterol Inhale 2 puffs into the lungs every 6 (six) hours as needed.       Diagnostic Studies: Dg Ankle Complete Left  Result Date: 03/23/2017 CLINICAL DATA:  46 year old female for open reduction and internal fixation left tibia-fibula. Subsequent encounter. EXAM: LEFT ANKLE COMPLETE - 3+ VIEW COMPARISON:  03/11/2017. FINDINGS: Three intraoperative C-arm views submitted for review after surgery. Revision of external fixation for left tibia/ fibula comminuted fractures. IMPRESSION: Revision of external fixation for left tibia/ fibula comminuted fractures. Electronically Signed   By: Genia Del M.D.   On: 03/23/2017 21:15   Dg Ankle Left Port  Result Date: 03/23/2017 CLINICAL DATA:  46 year old female for removal of hardware and revision of internal fixation. Subsequent encounter. EXAM: PORTABLE LEFT ANKLE - 2 VIEW COMPARISON:  Intraoperative exam same date. 03/11/2017. 11/03/2016. 10/26/2016. FINDINGS: Splint obscures fine osseous and soft tissue detail. Revision of open reduction and internal fixation of comminuted distal left tibia and fibular fracture. Incomplete healing of the comminuted fracture with slight displacement of smaller fracture fragments from the main fracture fragments. IMPRESSION: Revision distal left tibia-fibula internal fixation. Electronically Signed   By: Genia Del M.D.   On: 03/23/2017 22:08   Dg C-arm Gt 120 Min  Result Date: 03/23/2017 CLINICAL DATA:  46 year old female for open reduction and internal fixation left tibia-fibula. Subsequent encounter. EXAM: DG C-ARM GT 120 MIN COMPARISON:  None. FINDINGS: Three  intraoperative C-arm views submitted for review after surgery. Revision of external fixation for left tibia/ fibula comminuted fractures. IMPRESSION: Revision of external fixation for left tibia/ fibula comminuted fractures. Electronically Signed   By: Genia Del M.D.   On: 03/23/2017 21:17   Xr Ankle Complete Left  Result Date: 03/11/2017 3 views of the left ankle show a nonunion of the distal tibia and fibula fractures. There is evidence that the tibial plate is starting to bend.   Disposition: 01-Home or Self Care    Follow-up Information    Ninfa Linden,  Lind Guest, MD Follow up in 2 week(s).   Specialty:  Orthopedic Surgery Contact information: [place] [address] [phone]            Signed: Mcarthur Rossetti 03/24/2017, 7:40 AM

## 2017-03-24 NOTE — Discharge Instructions (Signed)
Ice and elevation for swelling. Use your bone stimulator on the from of your ankle. No weight at all on your left leg. Keep your splint/dressing clean and dry. Take a 325 mg aspirin once daily.

## 2017-03-24 NOTE — Progress Notes (Signed)
Patient ID: [name] Carr, female   DOB: 1971/02/23, 46 y.o.   MRN: [id number] Doing well overall.  Has been up with her walker.  Her left leg block is still working.  She is comfortable.  Her vitals are stale and her foot is perfused.  Her left leg splint is intact.  Can be discharged to home today.

## 2017-03-24 NOTE — Op Note (Signed)
NAME:  [name] Carr, [name] Carr                   ACCOUNT NO.:  MEDICAL RECORD NO.:  [id number]  LOCATION:                                 FACILITY:  PHYSICIAN:  Lind Guest. Ninfa Linden, M.D.DATE OF BIRTH:  [birth year]  DATE OF PROCEDURE:  03/23/2017 DATE OF DISCHARGE:                              OPERATIVE REPORT   PREOPERATIVE DIAGNOSIS:  Left pilon fracture, nonunion, status post open reduction and internal fixation of left pilon fracture involving the distal tibia and fibula.  POSTOPERATIVE DIAGNOSIS:  Left pilon fracture, nonunion, status post open reduction and internal fixation of left pilon fracture involving the distal tibia and fibula.  PROCEDURES: 1. Removal of hardware from left tibia and fibula. 2. Revision fixation of left tibia and fibula. 3. Takedown of nonunion left tibia and fibula. 4. Supplemental bone grafting with Infusion, DBM putty and cancellous     bone chips of tibia and fibula.  SURGEON:  Lind Guest. Ninfa Linden, M.D.  ASSISTANT:  Erskine Emery, P.A.  ANESTHESIA: 1. Regional left lower extremity block. 2. General.  TOURNIQUET TIME:  2 hours.  ANTIBIOTICS:  2 g of IV Ancef.  BLOOD LOSS:  Less than 100 mL.  COMPLICATIONS:  None.  FINDINGS:  Nonunion of distal tibia and fibula/pilon fracture.  IMPLANTS:  Zimmer 1/3rd semitubular locking plate on the fibula and Zimmer periarticular distal medial tibial locking plate.  COMPLICATIONS:  None.  INDICATIONS:  The patient is a 46 year old female, well known to me.  On the very first day of this year, she sustained an accidental mechanical fall, getting her leg caught in a grate, twisting her leg and sustaining a highly comminuted open left pilon fracture.  She was taken to the operating room that day for irrigation and debridement of her open fracture and placement and external fixation.  About 9 days later, she was taken to the operating room for definitive fixation of the fibula and tibia and removing  the external fixation at the time we did place cancellous bone chips and her fracture as well.  Over the next few months, we followed her closely with bone stimulation and she has not been able to heal the fracture.  I started to let her put some weight, open to stimulate more healing, and the fracture started to fall into varus and recurvatum.  The plate was bending.  At this point, we recommended revision fixation with takedown of nonunion and bone grafting of the bone deficits in her tibia and fibula.  This has been explained to her in detail and she does wish to proceed with surgery.  DESCRIPTION OF PROCEDURE:  After informed consent was obtained, appropriate left ankle was marked.  Anesthesia obtained with regional anesthesia.  She was brought to the operating room, placed supine on the operating room table.  General anesthesia was then obtained.  Her left leg was prepped and draped with DuraPrep and sterile drapes including a nonsterile stockinette placed around her upper thigh.  Time-out was called and she was identified as correct patient, correct left ankle.  I then first made an incision over the fibula and dissected down to the fibula and removed the plate and screws.  We brought the fibula easily. I was able to then take down the nonunion which was an obvious nonunion. We then made an anterior incision over her previous incision over the distal tibia and removed that plate easily.  We then took down that fracture nonunion as well.  We then chose a Zimmer medial periarticular locking plate and fashioned this along the medial cortex of the tibia to improve her alignment to get her out of varus and out of recurvatum. Once we were able to do that, we secured the tibia plate with distal locking screws and proximal bicortical screws and supplemental locking screw.  We then irrigated the wound and after curetting the bone some more, we placed our cancellous chips with Infusion and  DBM putty.  We likewise did this to the fibula and chose a 1/3rd semitubular locking plate, it is just a bridging plate over the fibula to secure this.  We then closed both wounds deep with 0 Vicryl followed by 2-0 Vicryl in the subcutaneous tissue, interrupted 2-0 nylon on the skin.  Her ankle was placed in a well-padded splint after sterile dressing was applied as well.  Again, the tourniquet was let down prior to putting our bone graft and that she got good bleeding tissue.  Once the splint was on, she was awakened, extubated, and taken to the recovery room in stable condition.  All final counts were correct.  There were no complications noted.  Of note, Erskine Emery, PAC, assisted in entire case, his assistance was crucial for facilitating all aspects of this case.     Lind Guest. Ninfa Linden, M.D.     CYB/MEDQ  D:  03/23/2017  T:  03/24/2017  Job:  [id number]

## 2017-03-24 NOTE — Progress Notes (Signed)
Pt was ambulating to the hall with the walker, NWB to LLE maint. Toes are warm but cannot move it yet. Splint with compression wrap dry and intact. Discharge instructions given to the pt, verbalized understanding. Discharged to home accompanied by parents.

## 2017-03-24 NOTE — Telephone Encounter (Signed)
Patient aware of the below message, and I gave her Trey's number to ask her  "bone stim" questions

## 2017-03-24 NOTE — Evaluation (Signed)
Physical Therapy Evaluation and Discharge Patient Details Name: [name] Carr MRN: [id number] DOB: Jul 27, 1971 Today's Date: 03/24/2017   History of Present Illness  Pt is a 46 yo female who underwent a fall in January and multiple ankle surgeries since. Pt underwent Removal of hardware from left tibia and fibula.Revision fixation of left tibia and fibula.  Clinical Impression  Pt known to service and has been dealing with L ankle fracture and multiple surgeries since January. Pt functioning safely with RW at mod I. Pt able to report how to do stairs with RW backwards with assist of parents holding walker and with crutches. Pt with no pain and safe for d/c home once medically stable. Pt with no further acute PT needs at this time. PT SIGNING OFF. Please reconsult if needed in future.    Follow Up Recommendations No PT follow up;Supervision - Intermittent    Equipment Recommendations  None recommended by PT    Recommendations for Other Services       Precautions / Restrictions Precautions Precautions: Fall Precaution Comments: L LE NWB Restrictions Weight Bearing Restrictions: Yes LLE Weight Bearing: Non weight bearing      Mobility  Bed Mobility Overal bed mobility: Modified Independent             General bed mobility comments: HOB elevated  Transfers Overall transfer level: Modified independent Equipment used: Rolling walker (2 wheeled)             General transfer comment: pt with good technique and safe hand placement  Ambulation/Gait Ambulation/Gait assistance: Modified independent (Device/Increase time) Ambulation Distance (Feet): 150 Feet Assistive device: Rolling walker (2 wheeled) Gait Pattern/deviations: Step-to pattern Gait velocity: wfl for surgery and age   General Gait Details: pt able to maintain L LE NWB  Stairs Stairs:  (pt able to report proper sequencing)          Wheelchair Mobility    Modified Rankin (Stroke Patients Only)        Balance Overall balance assessment: Modified Independent (with RW)                                           Pertinent Vitals/Pain Pain Assessment: 0-10 Pain Score: 0-No pain Pain Location: pt reports "they've been staying on top of it." "Plus I still have the block working" Pain Intervention(s): Premedicated before session    Home Living Family/patient expects to be discharged to:: Private residence Living Arrangements: Parent Available Help at Discharge: Family Type of Home: House Home Access: Stairs to enter Entrance Stairs-Rails: None Entrance Stairs-Number of Steps: 3 Home Layout: One level Home Equipment: Environmental consultant - 2 wheels;Crutches;Bedside commode;Tub bench;Wheelchair - manual Additional Comments: has been staying at parents house due to easier accesibiltiy of home layout    Prior Function Level of Independence: Independent with assistive device(s)         Comments: pt was using crutches primarily and still working and driving     Hand Dominance   Dominant Hand: Right    Extremity/Trunk Assessment   Upper Extremity Assessment Upper Extremity Assessment: Overall WFL for tasks assessed    Lower Extremity Assessment Lower Extremity Assessment: LLE deficits/detail LLE Deficits / Details: hip/knee wfl, ankle in splint, unable to move toes yet due to block    Cervical / Trunk Assessment Cervical / Trunk Assessment: Normal  Communication   Communication: No difficulties  Cognition Arousal/Alertness:  Awake/alert Behavior During Therapy: WFL for tasks assessed/performed Overall Cognitive Status: Within Functional Limits for tasks assessed                                        General Comments      Exercises     Assessment/Plan    PT Assessment Patent does not need any further PT services  PT Problem List         PT Treatment Interventions      PT Goals (Current goals can be found in the Care Plan section)   Acute Rehab PT Goals Patient Stated Goal: home today PT Goal Formulation: All assessment and education complete, DC therapy    Frequency     Barriers to discharge        Co-evaluation               AM-PAC PT "6 Clicks" Daily Activity  Outcome Measure Difficulty turning over in bed (including adjusting bedclothes, sheets and blankets)?: None Difficulty moving from lying on back to sitting on the side of the bed? : None Difficulty sitting down on and standing up from a chair with arms (e.g., wheelchair, bedside commode, etc,.)?: None Help needed moving to and from a bed to chair (including a wheelchair)?: None Help needed walking in hospital room?: None Help needed climbing 3-5 steps with a railing? : A Little 6 Click Score: 23    End of Session   Activity Tolerance: Patient tolerated treatment well;No increased pain Patient left: in bed;with call bell/phone within reach;with family/visitor present Nurse Communication: Mobility status PT Visit Diagnosis: Difficulty in walking, not elsewhere classified (R26.2)    Time: 9794-8016 PT Time Calculation (min) (ACUTE ONLY): 10 min   Charges:   PT Evaluation $PT Eval Low Complexity: 1 Procedure     PT G CodesKittie Carr, PT, DPT Pager #: [phone] Office #: [phone]   [name] Carr 03/24/2017, 8:23 AM

## 2017-03-24 NOTE — Telephone Encounter (Signed)
She does not need an antibiotic at this standpoint.

## 2017-03-24 NOTE — Telephone Encounter (Signed)
Patient would like to know if she needed to be on an antibiotic? Would like to know how would she be able to use bone stimulator?  CB# is [phone]

## 2017-03-25 ENCOUNTER — Encounter (HOSPITAL_COMMUNITY): Payer: Self-pay | Admitting: Orthopaedic Surgery

## 2017-03-26 NOTE — Addendum Note (Signed)
Addendum  created 03/26/17 1307 by Effie Berkshire, MD   Sign clinical note

## 2017-03-29 NOTE — Addendum Note (Signed)
Addendum  created 03/29/17 0944 by Oleta Mouse, MD   Sign clinical note

## 2017-03-29 NOTE — Addendum Note (Signed)
Addendum  created 03/29/17 0957 by Myrtie Soman, MD   Sign clinical note

## 2017-03-29 NOTE — Anesthesia Postprocedure Evaluation (Signed)
Anesthesia Post Note  Patient: [name] Carr  Procedure(s) Performed: Procedure(s) (LRB): OPEN REDUCTION INTERNAL FIXATION (ORIF) LEFT PILON FRACTURE (Left) REMOVAL EXTERNAL FIXATION ANKLE (Left)     Anesthesia Post Evaluation  Last Vitals:  Vitals:   11/05/16 2013 11/06/16 0410  BP: 133/77 (!) 143/81  Pulse: (!) 105 (!) 110  Resp: 16 17  Temp: 37.3 C 36.7 C    Last Pain:  Vitals:   11/06/16 1009  TempSrc:   PainSc: 9                  [name]

## 2017-03-29 NOTE — Addendum Note (Signed)
Addendum  created 03/29/17 1237 by Oleta Mouse, MD   Sign clinical note

## 2017-04-06 ENCOUNTER — Ambulatory Visit (INDEPENDENT_AMBULATORY_CARE_PROVIDER_SITE_OTHER): Payer: BLUE CROSS/BLUE SHIELD | Admitting: Physician Assistant

## 2017-04-06 ENCOUNTER — Ambulatory Visit (INDEPENDENT_AMBULATORY_CARE_PROVIDER_SITE_OTHER): Payer: BLUE CROSS/BLUE SHIELD

## 2017-04-06 ENCOUNTER — Encounter (INDEPENDENT_AMBULATORY_CARE_PROVIDER_SITE_OTHER): Payer: Self-pay | Admitting: Physician Assistant

## 2017-04-06 DIAGNOSIS — M25572 Pain in left ankle and joints of left foot: Secondary | ICD-10-CM | POA: Diagnosis not present

## 2017-04-06 DIAGNOSIS — S82872S Displaced pilon fracture of left tibia, sequela: Secondary | ICD-10-CM

## 2017-04-06 MED ORDER — ASPIRIN EC 325 MG PO TBEC: 325.0000 mg | DELAYED_RELEASE_TABLET | Freq: Once | ORAL | 0 refills | Status: AC

## 2017-04-06 NOTE — Progress Notes (Signed)
Office Visit Note   Patient: [name] Carr           Date of Birth: 05-17-1971           MRN: [id number] Visit Date: 04/06/2017              Requested by: Medicine, [place] Family No address on file PCP: [facility], [place]: Visit Diagnoses:  1. Pain in left ankle and joints of left foot   2. Pilon fracture of left tibia, sequela     Plan: Continue nonweightbearing left leg. She'll continue her aspirin 325 mg once daily. Scar tissue mobilization encouraged over all incisions. She is able to shower and get the incisions wet. She will use the Cam Walker boot like a cast except for hygiene and sleeping. See her back in 1 month at that time we'll obtain AP and lateral views of her left lower tibia. Prescriptions given for a knee scooter  Follow-Up Instructions: Return in about 4 weeks (around 05/04/2017).   Orders:  Orders Placed This Encounter  Procedures  . XR Ankle Complete Left   Meds ordered this encounter  Medications  . aspirin EC 325 MG tablet    Sig: Take 1 tablet (325 mg total) by mouth once.    Dispense:  60 tablet    Refill:  0      Procedures: No procedures performed   Clinical Data: No additional findings.   Subjective: Chief Complaint  Patient presents with  . Left Ankle - Routine Post Op    HPI Dennise returns today follow-up 2 weeks status post left ankle revision ORIF P on fracture with bone grafting to the left tibia. She's overall doing well. Denies any painstaking no pain medications. Splint nonweightbearing she is using crutches. She's had no fevers chills shortness breath or calf pain. Review of Systems   Objective: Vital Signs: There were no vitals taken for this visit.  Physical Exam  Constitutional: She is oriented to person, place, and time. She appears well-developed and well-nourished. No distress.  Cardiovascular: Intact distal pulses.   Neurological: She is alert and  oriented to person, place, and time.  Skin: She is not diaphoretic.    Ortho Exam Left lower leg calf supple nontender. Surgical incisions are well approximated with nylon sutures that are interrupted. There is no signs of infection ,minimal serosanguineous drainage from the anterior incision distally. Good dorsiflexion and plantarflexion the ankle without pain. Specialty Comments:  No specialty comments available.  Imaging: Xr Ankle Complete Left  Result Date: 04/06/2017 AP lateral views distal tibia: Early signs of consolidation of the left distal fibula and tibia. No hardware failure. No change in overall position alignment. Talus remains well located within the ankle mortise and there is no diastases.    PMFS History: Patient Active Problem List   Diagnosis Date Noted  . 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 Pilon fracture of left tibia, sequela 11/03/2016  . Open fracture of tibia and fibula, left, type I or II, initial encounter 10/26/2016  . Open fracture of left tibia and fibula, type I or II, initial encounter 10/26/2016   Past Medical History:  Diagnosis Date  . Anxiety   . Asthma   . Complication of anesthesia    one time had difficulty waking up  . Pneumonia    walking pneumonia  . Pre-diabetes    cnotrolled with diet an exercise  Family History  Problem Relation Age of Onset  . Breast cancer Mother   . Diabetes Mellitus II Father     Past Surgical History:  Procedure Laterality Date  . BREAST SURGERY     reduction  . EXTERNAL FIXATION LEG Left 10/26/2016   Procedure: EXTERNAL FIXATION ANKLE ;  Surgeon: Mcarthur Rossetti, MD;  Location: [facility];  Service: Orthopedics;  Laterality: Left;  . EXTERNAL FIXATION REMOVAL Left 11/03/2016   Procedure: REMOVAL EXTERNAL FIXATION ANKLE;  Surgeon: Mcarthur Rossetti, MD;  Location: [place];  Service: Orthopedics;  Laterality: Left;  . HARDWARE REMOVAL Left 03/23/2017   Procedure:  HARDWARE REMOVAL;  Surgeon: Mcarthur Rossetti, MD;  Location: [place];  Service: Orthopedics;  Laterality: Left;  . IRRIGATION AND DEBRIDEMENT FOOT Left 10/26/2016   Procedure: IRRIGATION AND DEBRIDEMENT;  Surgeon: Mcarthur Rossetti, MD;  Location: [facility];  Service: Orthopedics;  Laterality: Left;  . ORIF ANKLE FRACTURE Left 11/03/2016   Procedure: OPEN REDUCTION INTERNAL FIXATION (ORIF) LEFT PILON FRACTURE;  Surgeon: Mcarthur Rossetti, MD;  Location: [place];  Service: Orthopedics;  Laterality: Left;  . ORIF ANKLE FRACTURE Left 03/23/2017   Procedure: REMOVAL OF HARDWARE LEFT ANKLE, REVISION OPEN REDUCTION INTERNAL FIXATION (ORIF) LEFT PILON FRACTURE, BONE GRAFT LEFT TIBIA;  Surgeon: Mcarthur Rossetti, MD;  Location: [facility];  Service: Orthopedics;  Laterality: Left;  . PILONIDAL CYST EXCISION     Social History   Occupational History  . Not on file.   Social History Main Topics  . Smoking status: Former Research scientist (life sciences)  . Smokeless tobacco: Never Used  . Alcohol use Yes     Comment: social  . Drug use: No  . Sexual activity: Yes    Birth control/ protection: IUD

## 2017-04-20 ENCOUNTER — Other Ambulatory Visit (INDEPENDENT_AMBULATORY_CARE_PROVIDER_SITE_OTHER): Payer: Self-pay

## 2017-04-20 ENCOUNTER — Telehealth (INDEPENDENT_AMBULATORY_CARE_PROVIDER_SITE_OTHER): Payer: Self-pay | Admitting: Orthopaedic Surgery

## 2017-04-20 NOTE — Telephone Encounter (Signed)
Pt is s/p a take down non union removal of hardware revision pilon fracture 03/23/17 she is requesting a note saying that she may return to work full time. Please advise if this is ok?

## 2017-04-20 NOTE — Telephone Encounter (Signed)
Patient called asked if she can get a note faxed to her stating she can return to work full time. The number to contact is [phone]  Fax# is [phone]

## 2017-04-20 NOTE — Telephone Encounter (Signed)
Note written to return to work full time hours as tolerated by pt and faxed as requested.

## 2017-04-20 NOTE — Telephone Encounter (Signed)
ok 

## 2017-05-03 ENCOUNTER — Other Ambulatory Visit (INDEPENDENT_AMBULATORY_CARE_PROVIDER_SITE_OTHER): Payer: Self-pay

## 2017-05-03 ENCOUNTER — Telehealth (INDEPENDENT_AMBULATORY_CARE_PROVIDER_SITE_OTHER): Payer: Self-pay

## 2017-05-03 DIAGNOSIS — M79662 Pain in left lower leg: Secondary | ICD-10-CM

## 2017-05-03 NOTE — Telephone Encounter (Signed)
Send her for a dopplar to r/o DVT

## 2017-05-03 NOTE — Telephone Encounter (Signed)
Patient called stating that she is at high risk for a blood clot in her left leg.  Stated she can come in if she needs to be seen.  Cb # [id number].  Please Advise. Thank You.

## 2017-05-03 NOTE — Telephone Encounter (Signed)
Patient states she is having some pain in her claf, NOT constant Want to send her for a doppler or just work her in? Or?

## 2017-05-03 NOTE — Telephone Encounter (Signed)
I put the order in and tried calling [facility] but it kept going to their voicemail. Would you mind continuing to try since clinic is about to start? I told patient we would call her back when we got a time

## 2017-05-04 NOTE — Telephone Encounter (Signed)
Noted and pt aware of appt

## 2017-05-05 ENCOUNTER — Ambulatory Visit (HOSPITAL_COMMUNITY)
Admission: RE | Admit: 2017-05-05 | Discharge: 2017-05-05 | Disposition: A | Discharge status: Home / Self Care | Payer: BLUE CROSS/BLUE SHIELD | Source: Ambulatory Visit | Attending: Orthopaedic Surgery | Admitting: Orthopaedic Surgery

## 2017-05-05 ENCOUNTER — Telehealth (INDEPENDENT_AMBULATORY_CARE_PROVIDER_SITE_OTHER): Payer: Self-pay | Admitting: Radiology

## 2017-05-05 ENCOUNTER — Other Ambulatory Visit (INDEPENDENT_AMBULATORY_CARE_PROVIDER_SITE_OTHER): Payer: Self-pay

## 2017-05-05 DIAGNOSIS — I82432 Acute embolism and thrombosis of left popliteal vein: Secondary | ICD-10-CM | POA: Insufficient documentation

## 2017-05-05 DIAGNOSIS — I82812 Embolism and thrombosis of superficial veins of left lower extremities: Secondary | ICD-10-CM | POA: Insufficient documentation

## 2017-05-05 DIAGNOSIS — M79662 Pain in left lower leg: Secondary | ICD-10-CM | POA: Diagnosis not present

## 2017-05-05 DIAGNOSIS — I82492 Acute embolism and thrombosis of other specified deep vein of left lower extremity: Secondary | ICD-10-CM | POA: Diagnosis not present

## 2017-05-05 MED ORDER — RIVAROXABAN 15 MG PO TABS: 15.0000 mg | ORAL_TABLET | Freq: Two times a day (BID) | ORAL | 0 refills | Status: DC

## 2017-05-05 NOTE — Telephone Encounter (Signed)
[name] Carr called from [facility], patient had Left lower extremity venous doppler, states that she is POSITIVE for DVT.  I notified Dr. Ninfa Linden and they called in [place] [facility] [address].  Greg notified patient of this and she will pick it up.

## 2017-05-05 NOTE — Progress Notes (Signed)
**  Preliminary report by tech**  Left lower extremity venous duplex complete There is evidence of acute deep vein thrombosis involving the popliteal, and gastrocnemius veins of the left lower extremity. There is evidence of acute superficial vein thrombosis involving the lesser saphenous vein of the left lower extremity. There is no evidence of a Baker's cyst on the left. Results were given to [facility] at Dr. Trevor Mace office.  05/05/17 2:17 PM Carlos Levering RVT

## 2017-05-06 ENCOUNTER — Ambulatory Visit (INDEPENDENT_AMBULATORY_CARE_PROVIDER_SITE_OTHER): Payer: BLUE CROSS/BLUE SHIELD | Admitting: Orthopaedic Surgery

## 2017-05-06 ENCOUNTER — Ambulatory Visit (INDEPENDENT_AMBULATORY_CARE_PROVIDER_SITE_OTHER): Payer: BLUE CROSS/BLUE SHIELD

## 2017-05-06 ENCOUNTER — Telehealth (INDEPENDENT_AMBULATORY_CARE_PROVIDER_SITE_OTHER): Payer: Self-pay | Admitting: *Deleted

## 2017-05-06 DIAGNOSIS — S82872S Displaced pilon fracture of left tibia, sequela: Secondary | ICD-10-CM

## 2017-05-06 NOTE — Telephone Encounter (Signed)
If the form was from June yes it has been completed.

## 2017-05-06 NOTE — Progress Notes (Signed)
  [name] Carr is now 6 weeks status post revision open reduction internal fixation bone grafting of a severely comminuted pilon fracture. The bone stimulator she states now shows that she's had only one more treatment. We do feel that she is more bone stimulation given the fact that this was an injury that occurred back in January and now she's had well over 6 months of treatment and she had a nonunion that we had the tree with bone grafting. She again is now 6 weeks since that last surgery. Yesterday we sent her for an ultrasound Doppler to assess her left extremity for a DVT and it came back positive so we had to start her on Xarelto. The treatment dose is 15 mg twice daily or 3 weeks followed by 20 mg daily for 3-6 months. She understands this and is started this regimen.   On examination of her left ankle overall gross alignment appears stable. She moves her ankle back and forth with flexion-extension better. Her calf does have some firmness consistent with a DVT. 2 views of her left tibia show no malalignment issues at this point. Is difficult to tell if there is interval healing.  I will let her advance to just 25% weightbearing only. I would like to see her back in 3 weeks. At that visit I would like 3 views of the left ankle instead of a tib-fib view. All questions were encouraged and answered. We will work on trying to get her bone stimulator continued to help with healing due to this nonunion.

## 2017-05-06 NOTE — Telephone Encounter (Signed)
Pt came in asking if her paperwork had been completed.

## 2017-05-23 ENCOUNTER — Other Ambulatory Visit (INDEPENDENT_AMBULATORY_CARE_PROVIDER_SITE_OTHER): Payer: Self-pay | Admitting: Orthopaedic Surgery

## 2017-05-24 ENCOUNTER — Telehealth (INDEPENDENT_AMBULATORY_CARE_PROVIDER_SITE_OTHER): Payer: Self-pay | Admitting: Orthopaedic Surgery

## 2017-05-24 ENCOUNTER — Other Ambulatory Visit (INDEPENDENT_AMBULATORY_CARE_PROVIDER_SITE_OTHER): Payer: Self-pay

## 2017-05-24 MED ORDER — RIVAROXABAN 20 MG PO TABS: 20.0000 mg | ORAL_TABLET | Freq: Every day | ORAL | 6 refills | Status: DC

## 2017-05-24 NOTE — Telephone Encounter (Signed)
Advise

## 2017-05-24 NOTE — Telephone Encounter (Signed)
Please advise 

## 2017-05-24 NOTE — Telephone Encounter (Signed)
Ok to refill.  Xarelto 20 mg daily, #30 with 6 refills

## 2017-05-24 NOTE — Telephone Encounter (Signed)
Called into pharmacy

## 2017-05-24 NOTE — Telephone Encounter (Signed)
PT REQUESTED REFILL OF XARELTO PLEASE.  [phone]

## 2017-05-26 ENCOUNTER — Ambulatory Visit (INDEPENDENT_AMBULATORY_CARE_PROVIDER_SITE_OTHER): Payer: BLUE CROSS/BLUE SHIELD | Admitting: Orthopaedic Surgery

## 2017-05-26 ENCOUNTER — Ambulatory Visit (INDEPENDENT_AMBULATORY_CARE_PROVIDER_SITE_OTHER): Payer: Self-pay

## 2017-05-26 DIAGNOSIS — S82872S Displaced pilon fracture of left tibia, sequela: Secondary | ICD-10-CM

## 2017-05-26 NOTE — Progress Notes (Signed)
[name] Carr is now a week status post revision surgery left pilon fracture. She's been nonweightbearing. Her bone stimulator just got recalibrated and set up again.  On exam her ankle is nice and straight. She is improving as well as the ankle incisions are well-healed. However x-rays do not show significant healing yet.  I'm going to have her continue the bone stimulator and increase her weightbearing to 25-50%. We'll see her back in 4 weeks with a repeat 3 views of her left ankle.

## 2017-06-23 ENCOUNTER — Other Ambulatory Visit (INDEPENDENT_AMBULATORY_CARE_PROVIDER_SITE_OTHER): Payer: Self-pay

## 2017-06-23 ENCOUNTER — Ambulatory Visit (INDEPENDENT_AMBULATORY_CARE_PROVIDER_SITE_OTHER): Payer: BLUE CROSS/BLUE SHIELD | Admitting: Orthopaedic Surgery

## 2017-06-23 ENCOUNTER — Ambulatory Visit (INDEPENDENT_AMBULATORY_CARE_PROVIDER_SITE_OTHER): Payer: BLUE CROSS/BLUE SHIELD

## 2017-06-23 DIAGNOSIS — S82872S Displaced pilon fracture of left tibia, sequela: Secondary | ICD-10-CM | POA: Insufficient documentation

## 2017-06-23 DIAGNOSIS — S82872K Displaced pilon fracture of left tibia, subsequent encounter for closed fracture with nonunion: Secondary | ICD-10-CM

## 2017-06-23 NOTE — Progress Notes (Signed)
The patient is a 46 year old who is now 3 months out from a second surgery to her left ankle pilon fracture. At that surgery we removed hardware due to malunion/nonunion and placed bone graft and then a new medial plate. She's been slowly putting some weight on her ankle. In the interim she has had a blood clot and she is on Xarelto.  On exam all her incisions of healed nicely. She has limited dorsiflexion of her left foot. Her pain is minimal. She is still using crutches.  X-rays of the left ankle reviewed again and compared to films from 4 weeks ago. I see minimal healing and consolidation of the fracture.  At this point I would like to send her to Dr. Francee Gentile at [facility] orthopedics to assess her for other potential treatment options and modalities. From my standpoint I would consider taking her back to the operating room for bone grafting using her own iliac crest bone graft and potential other bone graft supplements but definitely her own cancellus bone from her pelvis. I explained this to her in detail what I would like to get Dr. Bary Leriche involvement an opinion about this as well. She understands this and agrees.

## 2017-06-29 ENCOUNTER — Telehealth (INDEPENDENT_AMBULATORY_CARE_PROVIDER_SITE_OTHER): Payer: Self-pay | Admitting: Radiology

## 2017-06-29 NOTE — Telephone Encounter (Signed)
Patient is calling wants to go ahead and schedule surgery with Dr. Ninfa Linden. She states that she was going to be referred to [facility] for 2nd opinion but she wants to cancel that and schedule as soon as possible here.

## 2017-06-29 NOTE — Telephone Encounter (Signed)
See below

## 2017-06-30 ENCOUNTER — Ambulatory Visit
Admission: RE | Admit: 2017-06-30 | Discharge: 2017-06-30 | Disposition: A | Discharge status: Home / Self Care | Payer: BLUE CROSS/BLUE SHIELD | Source: Ambulatory Visit | Attending: Orthopaedic Surgery | Admitting: Orthopaedic Surgery

## 2017-06-30 ENCOUNTER — Other Ambulatory Visit (INDEPENDENT_AMBULATORY_CARE_PROVIDER_SITE_OTHER): Payer: Self-pay

## 2017-06-30 DIAGNOSIS — S82899A Other fracture of unspecified lower leg, initial encounter for closed fracture: Secondary | ICD-10-CM

## 2017-06-30 NOTE — Telephone Encounter (Signed)
Patient aware CT has been ordered and we will see her in the office after to discuss

## 2017-06-30 NOTE — Telephone Encounter (Signed)
also notedof her injured left ankle without contrast to assess for nonunion. Once the CT is done with scattered her back in the office to go over it and talk again about surgical options. Try to do that sometime in the next week.

## 2017-07-01 ENCOUNTER — Telehealth (INDEPENDENT_AMBULATORY_CARE_PROVIDER_SITE_OTHER): Payer: Self-pay | Admitting: *Deleted

## 2017-07-01 NOTE — Telephone Encounter (Signed)
appt scheduled with Dr. Martinique Case on Monday Sept 17 at 10:15am in the [facility] office at [address] in [place]. Called left message on pt vm to return call for appt information, pt needs to come to office to pick up Xray CD.

## 2017-07-02 NOTE — Telephone Encounter (Signed)
Called [facility] orthopedics office and sw Eliezer Lofts advising to cancel the appt. appt has been cancelled.

## 2017-07-02 NOTE — Telephone Encounter (Signed)
Called pt to give appt information and she stated she has appt scheduled with Ninfa Linden on Monday and that she will be discussing sx options at this time and to go ahead and cancel the referral to [facility].

## 2017-07-05 ENCOUNTER — Ambulatory Visit (INDEPENDENT_AMBULATORY_CARE_PROVIDER_SITE_OTHER): Payer: BLUE CROSS/BLUE SHIELD | Admitting: Orthopaedic Surgery

## 2017-07-05 DIAGNOSIS — S82872K Displaced pilon fracture of left tibia, subsequent encounter for closed fracture with nonunion: Secondary | ICD-10-CM | POA: Diagnosis not present

## 2017-07-05 NOTE — Progress Notes (Signed)
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 We went over this in detail going over the CT scan and the report. 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 The hardware itself is intact and the ankle mortise and joint are intact. She has significant scarring around her ankle in general and limited motion. There is no evidence infection.  We talked at length about the next treatment option. I feel that obtaining bone graft from her own bone is a next step in the subcutaneous likely from the femur using intramedullary bone which should be cancellus bone and bone marrow contents which would give [place] a larger amount of bone tendon just going to the iliac crest. I would likely make a new incision with her in a slightly lateral position to allow [place] to get to the back of the tibia as well to take down the nonunion and the packed this with bone grafting. I would not place any other hardware in the ankle. I would then have her nonweightbearing for about a month with slowly increasing weightbearing as comfort allows. I expanded due to the risk moves to the surgery as well as the rationale reasoning behind it. I've talked with several colleagues about the surgery as well. I was referring her over to [facility] orthopedics for another opinion that she wants to stick with me in terms of my treatment plan.  She understands if this fails the next step would likely be a fusion. All questions were encouraged and answered. She is on blood thinning medication which she will stop 2 days before surgery. We see her back at 2 weeks postoperative for suture removal we'll get a repeat 3 views of her left ankle.

## 2017-07-08 ENCOUNTER — Other Ambulatory Visit (INDEPENDENT_AMBULATORY_CARE_PROVIDER_SITE_OTHER): Payer: Self-pay | Admitting: Orthopaedic Surgery

## 2017-07-13 ENCOUNTER — Other Ambulatory Visit (INDEPENDENT_AMBULATORY_CARE_PROVIDER_SITE_OTHER): Payer: Self-pay | Admitting: Orthopaedic Surgery

## 2017-07-13 DIAGNOSIS — S82872K Displaced pilon fracture of left tibia, subsequent encounter for closed fracture with nonunion: Secondary | ICD-10-CM

## 2017-07-14 NOTE — Pre-Procedure Instructions (Addendum)
    [name] Carr  07/14/2017      [place] [address] [place] [address] Phone: [phone] Fax: [phone]  [id number] [place] [place] [place] [address] Phone: [phone] Fax: [phone]    Your procedure is scheduled on Tuesday, Sept. 25th         Report to [facility] Admitting at 11:00 AM             (posted surgery time 1:00p-2:24p)   Call this number if you have problems the morning of surgery:  [phone].   Remember:   Do not eat food or drink liquids after midnight Monday.             4-5 days prior to surgery STOP TAKING any Vitamins, Herbal Supplements, Anti-inflammatories.   Take these medicines the morning of surgery with A SIP OF WATER :None             However, please use your inhaler that morning.               Xarelto - last dose will be taken___________________________   Do not wear jewelry, make-up or nail polish.  Do not wear lotions, powders, perfumes, or deoderant.  Do not shave 48 hours prior to surgery.                                                                                                                         [facility] is not responsible for any belongings or valuables.  Contacts, dentures or bridgework may not be worn into surgery.  Leave your suitcase in the car.  After surgery it may be brought to your room.  For patients admitted to the hospital, discharge time will be determined by your treatment team.  Please read over the following fact sheets that you were given. Pain Booklet and Surgical Site Infection Prevention

## 2017-07-15 ENCOUNTER — Encounter (HOSPITAL_COMMUNITY)
Admission: RE | Admit: 2017-07-15 | Discharge: 2017-07-15 | Disposition: A | Discharge status: Home / Self Care | Payer: BLUE CROSS/BLUE SHIELD | Source: Ambulatory Visit | Attending: Orthopaedic Surgery | Admitting: Orthopaedic Surgery

## 2017-07-15 ENCOUNTER — Encounter (HOSPITAL_COMMUNITY): Payer: Self-pay

## 2017-07-15 DIAGNOSIS — S82302A Unspecified fracture of lower end of left tibia, initial encounter for closed fracture: Secondary | ICD-10-CM | POA: Insufficient documentation

## 2017-07-15 DIAGNOSIS — Z01812 Encounter for preprocedural laboratory examination: Secondary | ICD-10-CM | POA: Diagnosis present

## 2017-07-15 DIAGNOSIS — X58XXXA Exposure to other specified factors, initial encounter: Secondary | ICD-10-CM | POA: Insufficient documentation

## 2017-07-15 HISTORY — DX: Nausea with vomiting, unspecified: R11.2

## 2017-07-15 HISTORY — DX: Other specified postprocedural states: Z98.890

## 2017-07-15 HISTORY — DX: Presence of (intrauterine) contraceptive device: Z97.5

## 2017-07-15 LAB — HCG, SERUM, QUALITATIVE: PREG SERUM: NEGATIVE

## 2017-07-15 LAB — BASIC METABOLIC PANEL
Anion gap: 7 (ref 5–15)
BUN: 10 mg/dL (ref 6–20)
CHLORIDE: 107 mmol/L (ref 101–111)
CO2: 22 mmol/L (ref 22–32)
CREATININE: 0.73 mg/dL (ref 0.44–1.00)
Calcium: 8.8 mg/dL — ABNORMAL LOW (ref 8.9–10.3)
GFR calc Af Amer: >60 mL/min (ref >60)
GFR calc non Af Amer: >60 mL/min (ref >60)
GLUCOSE: 103 mg/dL — AB (ref 65–99)
POTASSIUM: 3.9 mmol/L (ref 3.5–5.1)
SODIUM: 136 mmol/L (ref 135–145)

## 2017-07-15 LAB — HEMOGLOBIN A1C
Hgb A1c MFr Bld: 5.5 % (ref 4.8–5.6)
Mean Plasma Glucose: 111.15 mg/dL

## 2017-07-15 LAB — CBC
HCT: 37.6 % (ref 36.0–46.0)
HEMOGLOBIN: 12.2 g/dL (ref 12.0–15.0)
MCH: 29 pg (ref 26.0–34.0)
MCHC: 32.4 g/dL (ref 30.0–36.0)
MCV: 89.3 fL (ref 78.0–100.0)
PLATELETS: 388 10*3/uL (ref 150–400)
RBC: 4.21 MIL/uL (ref 3.87–5.11)
RDW: 12.6 % (ref 11.5–15.5)
WBC: 7.1 10*3/uL (ref 4.0–10.5)

## 2017-07-15 LAB — SURGICAL PCR SCREEN
MRSA, PCR: NEGATIVE
Staphylococcus aureus: NEGATIVE

## 2017-07-15 NOTE — Progress Notes (Addendum)
PCP is Dr. Elenor Quinones  LOV 02/2017 Did see Dr. Daneen Schick more than 5-10 yrs ago.  Was having issues d/t using "yaz"   Had stress, echo.  Has had no problems since stopping Yaz, has not been back since. Denies murmur, cp, sob Was instructed by Dr. Ninfa Linden to stop taking Xarelto 2 days prior to surgery. (he's the one that put her on it) She's had 2 DVT and 1 surface clot.  Gets checked every 3 mths. Was told she was "Pre" diabetic.  Doesn't check sugars.  Last A1C 5.8 back in 02/2017 Unable to use the chlorhexadine soap.  Instructed her to use 'dial' or any anti-bacterial soap.

## 2017-07-19 MED ORDER — CEFAZOLIN SODIUM-DEXTROSE 2-4 GM/100ML-% IV SOLN
2.0000 g | INTRAVENOUS | Status: AC
  Administered 2017-07-20: 2 g via INTRAVENOUS
  Filled 2017-07-19: qty 100

## 2017-07-20 ENCOUNTER — Observation Stay (HOSPITAL_COMMUNITY)
Admission: RE | Admit: 2017-07-20 | Discharge: 2017-07-21 | Disposition: A | Discharge status: Home / Self Care | Payer: BLUE CROSS/BLUE SHIELD | Source: Ambulatory Visit | Attending: Orthopaedic Surgery | Admitting: Orthopaedic Surgery

## 2017-07-20 ENCOUNTER — Inpatient Hospital Stay (HOSPITAL_COMMUNITY): Payer: BLUE CROSS/BLUE SHIELD

## 2017-07-20 ENCOUNTER — Encounter (HOSPITAL_COMMUNITY)
Admission: RE | Disposition: A | Discharge status: Home / Self Care | Payer: Self-pay | Source: Ambulatory Visit | Attending: Orthopaedic Surgery

## 2017-07-20 ENCOUNTER — Inpatient Hospital Stay (HOSPITAL_COMMUNITY): Payer: BLUE CROSS/BLUE SHIELD | Admitting: Certified Registered Nurse Anesthetist

## 2017-07-20 ENCOUNTER — Encounter (HOSPITAL_COMMUNITY): Payer: Self-pay

## 2017-07-20 ENCOUNTER — Inpatient Hospital Stay (HOSPITAL_COMMUNITY): Payer: BLUE CROSS/BLUE SHIELD | Admitting: Emergency Medicine

## 2017-07-20 DIAGNOSIS — J45909 Unspecified asthma, uncomplicated: Secondary | ICD-10-CM | POA: Insufficient documentation

## 2017-07-20 DIAGNOSIS — F419 Anxiety disorder, unspecified: Secondary | ICD-10-CM | POA: Diagnosis not present

## 2017-07-20 DIAGNOSIS — S82872N Displaced pilon fracture of left tibia, subsequent encounter for open fracture type IIIA, IIIB, or IIIC with nonunion: Secondary | ICD-10-CM

## 2017-07-20 DIAGNOSIS — Z79891 Long term (current) use of opiate analgesic: Secondary | ICD-10-CM | POA: Insufficient documentation

## 2017-07-20 DIAGNOSIS — Z791 Long term (current) use of non-steroidal anti-inflammatories (NSAID): Secondary | ICD-10-CM | POA: Insufficient documentation

## 2017-07-20 DIAGNOSIS — S82872K Displaced pilon fracture of left tibia, subsequent encounter for closed fracture with nonunion: Secondary | ICD-10-CM | POA: Diagnosis present

## 2017-07-20 DIAGNOSIS — Z7901 Long term (current) use of anticoagulants: Secondary | ICD-10-CM | POA: Insufficient documentation

## 2017-07-20 DIAGNOSIS — Z79899 Other long term (current) drug therapy: Secondary | ICD-10-CM | POA: Diagnosis not present

## 2017-07-20 DIAGNOSIS — Z881 Allergy status to other antibiotic agents status: Secondary | ICD-10-CM | POA: Diagnosis not present

## 2017-07-20 DIAGNOSIS — S82302K Unspecified fracture of lower end of left tibia, subsequent encounter for closed fracture with nonunion: Secondary | ICD-10-CM | POA: Diagnosis present

## 2017-07-20 DIAGNOSIS — Z888 Allergy status to other drugs, medicaments and biological substances status: Secondary | ICD-10-CM | POA: Insufficient documentation

## 2017-07-20 DIAGNOSIS — Z419 Encounter for procedure for purposes other than remedying health state, unspecified: Secondary | ICD-10-CM

## 2017-07-20 DIAGNOSIS — X58XXXD Exposure to other specified factors, subsequent encounter: Secondary | ICD-10-CM | POA: Insufficient documentation

## 2017-07-20 DIAGNOSIS — S82872M Displaced pilon fracture of left tibia, subsequent encounter for open fracture type I or II with nonunion: Secondary | ICD-10-CM | POA: Diagnosis not present

## 2017-07-20 HISTORY — PX: ORIF ANKLE FRACTURE: SHX5408

## 2017-07-20 LAB — GLUCOSE, CAPILLARY: Glucose-Capillary: 89 mg/dL (ref 65–99)

## 2017-07-20 SURGERY — OPEN REDUCTION INTERNAL FIXATION (ORIF) ANKLE FRACTURE
Anesthesia: General | Site: Leg Lower | Laterality: Left

## 2017-07-20 MED ORDER — FENTANYL CITRATE (PF) 250 MCG/5ML IJ SOLN
INTRAMUSCULAR | Status: AC
  Filled 2017-07-20: qty 5

## 2017-07-20 MED ORDER — DIPHENHYDRAMINE HCL 50 MG/ML IJ SOLN
INTRAMUSCULAR | Status: DC | PRN
  Administered 2017-07-20: 25 mg via INTRAVENOUS

## 2017-07-20 MED ORDER — MONTELUKAST SODIUM 10 MG PO TABS
10.0000 mg | ORAL_TABLET | Freq: Every day | ORAL | Status: DC
  Administered 2017-07-20: 10 mg via ORAL
  Filled 2017-07-20: qty 1

## 2017-07-20 MED ORDER — ALPRAZOLAM 0.5 MG PO TABS
0.5000 mg | ORAL_TABLET | Freq: Every day | ORAL | Status: DC
  Administered 2017-07-20: 0.5 mg via ORAL
  Filled 2017-07-20: qty 1

## 2017-07-20 MED ORDER — MIDAZOLAM HCL 2 MG/2ML IJ SOLN
INTRAMUSCULAR | Status: AC
  Filled 2017-07-20: qty 2

## 2017-07-20 MED ORDER — SODIUM CHLORIDE 0.9 % IJ SOLN
INTRAMUSCULAR | Status: AC
  Filled 2017-07-20: qty 10

## 2017-07-20 MED ORDER — FENTANYL CITRATE (PF) 100 MCG/2ML IJ SOLN
INTRAMUSCULAR | Status: DC | PRN
  Administered 2017-07-20 (×5): 50 ug via INTRAVENOUS

## 2017-07-20 MED ORDER — OXYCODONE HCL 5 MG PO TABS
ORAL_TABLET | ORAL | Status: AC
  Filled 2017-07-20: qty 2

## 2017-07-20 MED ORDER — OXYCODONE HCL 5 MG PO TABS
5.0000 mg | ORAL_TABLET | ORAL | Status: DC | PRN
  Administered 2017-07-20 – 2017-07-21 (×6): 10 mg via ORAL
  Filled 2017-07-20 (×5): qty 2

## 2017-07-20 MED ORDER — ACETAMINOPHEN 650 MG RE SUPP: 650.0000 mg | Freq: Four times a day (QID) | RECTAL | Status: DC | PRN

## 2017-07-20 MED ORDER — FENTANYL CITRATE (PF) 100 MCG/2ML IJ SOLN
INTRAMUSCULAR | Status: AC
  Administered 2017-07-20: 50 ug via INTRAVENOUS
  Filled 2017-07-20: qty 2

## 2017-07-20 MED ORDER — ONDANSETRON HCL 4 MG/2ML IJ SOLN
INTRAMUSCULAR | Status: DC | PRN
  Administered 2017-07-20: 4 mg via INTRAVENOUS

## 2017-07-20 MED ORDER — METOCLOPRAMIDE HCL 5 MG/ML IJ SOLN: 5.0000 mg | Freq: Three times a day (TID) | INTRAMUSCULAR | Status: DC | PRN

## 2017-07-20 MED ORDER — RIVAROXABAN 20 MG PO TABS: 20.0000 mg | ORAL_TABLET | Freq: Every day | ORAL | Status: DC

## 2017-07-20 MED ORDER — LACTATED RINGERS IV SOLN
INTRAVENOUS | Status: DC
  Administered 2017-07-20 (×2): via INTRAVENOUS

## 2017-07-20 MED ORDER — ONDANSETRON HCL 4 MG PO TABS: 4.0000 mg | ORAL_TABLET | Freq: Four times a day (QID) | ORAL | Status: DC | PRN

## 2017-07-20 MED ORDER — DIPHENHYDRAMINE HCL 12.5 MG/5ML PO ELIX: 12.5000 mg | ORAL_SOLUTION | ORAL | Status: DC | PRN

## 2017-07-20 MED ORDER — 0.9 % SODIUM CHLORIDE (POUR BTL) OPTIME
TOPICAL | Status: DC | PRN
  Administered 2017-07-20: 1000 mL

## 2017-07-20 MED ORDER — SODIUM CHLORIDE 0.9 % IV SOLN: INTRAVENOUS | Status: DC

## 2017-07-20 MED ORDER — ONDANSETRON HCL 4 MG/2ML IJ SOLN: 4.0000 mg | Freq: Four times a day (QID) | INTRAMUSCULAR | Status: DC | PRN

## 2017-07-20 MED ORDER — EPHEDRINE SULFATE 50 MG/ML IJ SOLN
INTRAMUSCULAR | Status: AC
  Filled 2017-07-20: qty 1

## 2017-07-20 MED ORDER — METHOCARBAMOL 500 MG PO TABS
ORAL_TABLET | ORAL | Status: AC
  Filled 2017-07-20: qty 1

## 2017-07-20 MED ORDER — METOCLOPRAMIDE HCL 5 MG PO TABS: 5.0000 mg | ORAL_TABLET | Freq: Three times a day (TID) | ORAL | Status: DC | PRN

## 2017-07-20 MED ORDER — GLYCOPYRROLATE 0.2 MG/ML IV SOSY
PREFILLED_SYRINGE | INTRAVENOUS | Status: DC | PRN
  Administered 2017-07-20 (×2): .2 mg via INTRAVENOUS

## 2017-07-20 MED ORDER — HYDROMORPHONE HCL 1 MG/ML IJ SOLN: 1.0000 mg | INTRAMUSCULAR | Status: DC | PRN

## 2017-07-20 MED ORDER — PROPOFOL 10 MG/ML IV BOLUS
INTRAVENOUS | Status: AC
  Filled 2017-07-20: qty 40

## 2017-07-20 MED ORDER — ALBUTEROL SULFATE (2.5 MG/3ML) 0.083% IN NEBU: 2.5000 mg | INHALATION_SOLUTION | Freq: Four times a day (QID) | RESPIRATORY_TRACT | Status: DC | PRN

## 2017-07-20 MED ORDER — METHOCARBAMOL 500 MG PO TABS
500.0000 mg | ORAL_TABLET | Freq: Four times a day (QID) | ORAL | Status: DC | PRN
  Administered 2017-07-20 – 2017-07-21 (×2): 500 mg via ORAL
  Filled 2017-07-20: qty 1

## 2017-07-20 MED ORDER — METHOCARBAMOL 1000 MG/10ML IJ SOLN
500.0000 mg | Freq: Four times a day (QID) | INTRAMUSCULAR | Status: DC | PRN
  Filled 2017-07-20: qty 5

## 2017-07-20 MED ORDER — FENTANYL CITRATE (PF) 100 MCG/2ML IJ SOLN
INTRAMUSCULAR | Status: AC
  Filled 2017-07-20: qty 2

## 2017-07-20 MED ORDER — VITAMIN D3 25 MCG (1000 UNIT) PO TABS
1000.0000 [IU] | ORAL_TABLET | Freq: Every day | ORAL | Status: DC
  Administered 2017-07-21: 1000 [IU] via ORAL
  Filled 2017-07-20 (×2): qty 1

## 2017-07-20 MED ORDER — CITALOPRAM HYDROBROMIDE 20 MG PO TABS
20.0000 mg | ORAL_TABLET | Freq: Every day | ORAL | Status: DC
  Administered 2017-07-20: 20 mg via ORAL
  Filled 2017-07-20: qty 1

## 2017-07-20 MED ORDER — LIDOCAINE 2% (20 MG/ML) 5 ML SYRINGE
INTRAMUSCULAR | Status: DC | PRN
  Administered 2017-07-20: 100 mg via INTRAVENOUS

## 2017-07-20 MED ORDER — ACETAMINOPHEN 325 MG PO TABS: 650.0000 mg | ORAL_TABLET | Freq: Four times a day (QID) | ORAL | Status: DC | PRN

## 2017-07-20 MED ORDER — CEFAZOLIN SODIUM-DEXTROSE 1-4 GM/50ML-% IV SOLN
1.0000 g | Freq: Four times a day (QID) | INTRAVENOUS | Status: AC
  Administered 2017-07-20 – 2017-07-21 (×3): 1 g via INTRAVENOUS
  Filled 2017-07-20 (×3): qty 50

## 2017-07-20 MED ORDER — DEXAMETHASONE SODIUM PHOSPHATE 10 MG/ML IJ SOLN
INTRAMUSCULAR | Status: DC | PRN
  Administered 2017-07-20: 10 mg via INTRAVENOUS

## 2017-07-20 MED ORDER — SCOPOLAMINE 1 MG/3DAYS TD PT72
MEDICATED_PATCH | TRANSDERMAL | Status: DC | PRN
  Administered 2017-07-20: 1 via TRANSDERMAL

## 2017-07-20 MED ORDER — FENTANYL CITRATE (PF) 100 MCG/2ML IJ SOLN
25.0000 ug | INTRAMUSCULAR | Status: DC | PRN
  Administered 2017-07-20 (×2): 50 ug via INTRAVENOUS

## 2017-07-20 MED ORDER — PROPOFOL 10 MG/ML IV BOLUS
INTRAVENOUS | Status: DC | PRN
  Administered 2017-07-20: 200 mg via INTRAVENOUS
  Administered 2017-07-20: 40 mg via INTRAVENOUS

## 2017-07-20 MED ORDER — MIDAZOLAM HCL 5 MG/5ML IJ SOLN
INTRAMUSCULAR | Status: DC | PRN
  Administered 2017-07-20: 2 mg via INTRAVENOUS

## 2017-07-20 SURGICAL SUPPLY — 66 items
ASMB TUBE 520 STRL RMR IRR (MISCELLANEOUS) ×1
BANDAGE ACE 4X5 VEL STRL LF (GAUZE/BANDAGES/DRESSINGS) IMPLANT
BANDAGE ACE 6X5 VEL STRL LF (GAUZE/BANDAGES/DRESSINGS) ×2 IMPLANT
BANDAGE ESMARK 6X9 LF (GAUZE/BANDAGES/DRESSINGS) IMPLANT
BNDG CMPR 9X6 STRL LF SNTH (GAUZE/BANDAGES/DRESSINGS) ×1
BNDG ESMARK 6X9 LF (GAUZE/BANDAGES/DRESSINGS) ×3
BONE CANC CHIPS 40CC CAN1/2 (Bone Implant) ×3 IMPLANT
CHIPS CANC BONE 40CC CAN1/2 (Bone Implant) ×1 IMPLANT
CLIP LOCKING FOR RIA (CLIP) ×2 IMPLANT
CLOSURE STERI-STRIP 1/2X4 (GAUZE/BANDAGES/DRESSINGS) ×1
CLSR STERI-STRIP ANTIMIC 1/2X4 (GAUZE/BANDAGES/DRESSINGS) ×1 IMPLANT
COVER SURGICAL LIGHT HANDLE (MISCELLANEOUS) ×3 IMPLANT
CUFF TOURNIQUET SINGLE 34IN LL (TOURNIQUET CUFF) IMPLANT
CUFF TOURNIQUET SINGLE 44IN (TOURNIQUET CUFF) IMPLANT
DRAPE C-ARM 42X72 X-RAY (DRAPES) ×3 IMPLANT
DRAPE U-SHAPE 47X51 STRL (DRAPES) ×3 IMPLANT
DRIVE SHAFT SEAL ×2 IMPLANT
DRIVE SHAFT SEAL STERILE ×2 IMPLANT
DRSG AQUACEL AG ADV 3.5X 4 (GAUZE/BANDAGES/DRESSINGS) ×2 IMPLANT
DURAPREP 26ML APPLICATOR (WOUND CARE) ×3 IMPLANT
ELECT REM PT RETURN 9FT ADLT (ELECTROSURGICAL) ×3
ELECTRODE REM PT RTRN 9FT ADLT (ELECTROSURGICAL) ×1 IMPLANT
GAUZE SPONGE 4X4 12PLY STRL (GAUZE/BANDAGES/DRESSINGS) ×3 IMPLANT
GAUZE XEROFORM 1X8 LF (GAUZE/BANDAGES/DRESSINGS) ×2 IMPLANT
GAUZE XEROFORM 5X9 LF (GAUZE/BANDAGES/DRESSINGS) ×3 IMPLANT
GLOVE BIO SURGEON STRL SZ8 (GLOVE) ×3 IMPLANT
GLOVE BIOGEL PI IND STRL 8 (GLOVE) ×2 IMPLANT
GLOVE BIOGEL PI INDICATOR 8 (GLOVE) ×4
GLOVE ORTHO TXT STRL SZ7.5 (GLOVE) ×3 IMPLANT
GOWN STRL REUS W/ TWL LRG LVL3 (GOWN DISPOSABLE) ×2 IMPLANT
GOWN STRL REUS W/ TWL XL LVL3 (GOWN DISPOSABLE) ×4 IMPLANT
GOWN STRL REUS W/TWL LRG LVL3 (GOWN DISPOSABLE) ×6
GOWN STRL REUS W/TWL XL LVL3 (GOWN DISPOSABLE) ×12
GRAFT BNE CHIP CANC 1-8 40 (Bone Implant) IMPLANT
GRAFT FILTER FOR RIA 520 LGTH (MISCELLANEOUS) ×2 IMPLANT
GUIDEWIRE 3.2X400 (WIRE) ×4 IMPLANT
HEAD REAMER 12MM (Head) ×2 IMPLANT
KIT BASIN OR (CUSTOM PROCEDURE TRAY) ×3 IMPLANT
KIT ROOM TURNOVER OR (KITS) ×3 IMPLANT
MANIFOLD NEPTUNE II (INSTRUMENTS) ×3 IMPLANT
NDL HYPO 25GX1X1/2 BEV (NEEDLE) IMPLANT
NEEDLE HYPO 25GX1X1/2 BEV (NEEDLE) IMPLANT
NS IRRIG 1000ML POUR BTL (IV SOLUTION) ×3 IMPLANT
PACK ORTHO EXTREMITY (CUSTOM PROCEDURE TRAY) ×3 IMPLANT
PAD ABD 8X10 STRL (GAUZE/BANDAGES/DRESSINGS) ×2 IMPLANT
PAD ARMBOARD 7.5X6 YLW CONV (MISCELLANEOUS) ×6 IMPLANT
PAD CAST 4YDX4 CTTN HI CHSV (CAST SUPPLIES) ×2 IMPLANT
PADDING CAST COTTON 4X4 STRL (CAST SUPPLIES) ×3
PADDING CAST COTTON 6X4 STRL (CAST SUPPLIES) ×3 IMPLANT
PREFILTER NEPTUNE (MISCELLANEOUS) ×3 IMPLANT
REAMER ROD DEEP FLUTE 2.5X950 (INSTRUMENTS) ×2 IMPLANT
SPONGE LAP 4X18 X RAY DECT (DISPOSABLE) ×6 IMPLANT
SUCTION FRAZIER HANDLE 10FR (MISCELLANEOUS) ×2
SUCTION TUBE FRAZIER 10FR DISP (MISCELLANEOUS) ×1 IMPLANT
SUT ETHILON 2 0 FS 18 (SUTURE) ×3 IMPLANT
SUT VIC AB 0 CT1 27 (SUTURE) ×3
SUT VIC AB 0 CT1 27XBRD ANBCTR (SUTURE) ×1 IMPLANT
SUT VIC AB 2-0 CT1 27 (SUTURE) ×3
SUT VIC AB 2-0 CT1 TAPERPNT 27 (SUTURE) ×1 IMPLANT
SYR CONTROL 10ML LL (SYRINGE) IMPLANT
TOWEL OR 17X24 6PK STRL BLUE (TOWEL DISPOSABLE) ×3 IMPLANT
TOWEL OR 17X26 10 PK STRL BLUE (TOWEL DISPOSABLE) ×3 IMPLANT
TUBE ASSEMBLY RIA STERILE (MISCELLANEOUS) ×2 IMPLANT
TUBE CONNECTING 12'X1/4 (SUCTIONS) ×1
TUBE CONNECTING 12X1/4 (SUCTIONS) ×2 IMPLANT
WATER STERILE IRR 1000ML POUR (IV SOLUTION) ×3 IMPLANT

## 2017-07-20 NOTE — Anesthesia Procedure Notes (Signed)
Procedure Name: LMA Insertion Date/Time: 07/20/2017 12:50 PM Performed by: Valda Favia Pre-anesthesia Checklist: Patient identified, Emergency Drugs available, Suction available, Patient being monitored and Timeout performed Patient Re-evaluated:Patient Re-evaluated prior to induction Oxygen Delivery Method: Circle system utilized Preoxygenation: Pre-oxygenation with 100% oxygen Induction Type: IV induction LMA: LMA inserted LMA Size: 4.0 Number of attempts: 1 Placement Confirmation: positive ETCO2 and breath sounds checked- equal and bilateral Tube secured with: Tape Dental Injury: Teeth and Oropharynx as per pre-operative assessment

## 2017-07-20 NOTE — H&P (Signed)
[name] Carr is an 46 y.o. female.   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  Has had multiple surgeries on this ankle since then due to the inability to heal the fracture.  She has had revision plating as well.  The alignment has improved significantly, but recent plain films and CT scan confirms a persistent non-union. She understands fully our recommendation for additional surgery with autograft bone grafting and assessment for possible infection.  Past Medical History:  Diagnosis Date  . Anxiety   . Asthma   . Complication of anesthesia    one time had difficulty waking up  . IUD (intrauterine device) in place    Paraguard IUD inserted 2008  . Pneumonia    walking pneumonia  . PONV (postoperative nausea and vomiting)    'MANY MANY MANY years ago"  . Pre-diabetes    cnotrolled with diet an exercise    Past Surgical History:  Procedure Laterality Date  . BREAST SURGERY     reduction  . EXTERNAL FIXATION LEG Left 10/26/2016   Procedure: EXTERNAL FIXATION ANKLE ;  Surgeon: Mcarthur Rossetti, MD;  Location: [facility];  Service: Orthopedics;  Laterality: Left;  . EXTERNAL FIXATION REMOVAL Left 11/03/2016   Procedure: REMOVAL EXTERNAL FIXATION ANKLE;  Surgeon: Mcarthur Rossetti, MD;  Location: [facility];  Service: Orthopedics;  Laterality: Left;  . FRACTURE SURGERY     not until 10/2016  . HARDWARE REMOVAL Left 03/23/2017   Procedure: HARDWARE REMOVAL;  Surgeon: Mcarthur Rossetti, MD;  Location: [facility];  Service: Orthopedics;  Laterality: Left;  . IRRIGATION AND DEBRIDEMENT FOOT Left 10/26/2016   Procedure: IRRIGATION AND DEBRIDEMENT;  Surgeon: Mcarthur Rossetti, MD;  Location: [facility];  Service: Orthopedics;  Laterality: Left;  . ORIF ANKLE FRACTURE Left 11/03/2016   Procedure: OPEN REDUCTION INTERNAL FIXATION (ORIF) LEFT PILON FRACTURE;  Surgeon: Mcarthur Rossetti,  MD;  Location: [facility];  Service: Orthopedics;  Laterality: Left;  . ORIF ANKLE FRACTURE Left 03/23/2017   Procedure: REMOVAL OF HARDWARE LEFT ANKLE, REVISION OPEN REDUCTION INTERNAL FIXATION (ORIF) LEFT PILON FRACTURE, BONE GRAFT LEFT TIBIA;  Surgeon: Mcarthur Rossetti, MD;  Location: [facility];  Service: Orthopedics;  Laterality: Left;  . PILONIDAL CYST EXCISION      Family History  Problem Relation Age of Onset  . Breast cancer Mother   . Diabetes Mellitus II Father    Social History:  reports that she has quit smoking. She has never used smokeless tobacco. She reports that she drinks alcohol. She reports that she does not use drugs.  Allergies:  Allergies  Allergen Reactions  . Morphine Shortness Of Breath    LIKELY DOSE RELATED  . Latex Hives, Itching and Rash  . Tetracycline Hives and Itching  . Chlorhexidine Rash    Itching from CHG wipes in pre-op    Medications Prior to Admission  Medication Sig Dispense Refill  . acetaminophen (TYLENOL) 500 MG tablet Take 500-1,000 mg by mouth every 8 (eight) hours as needed for mild pain or headache.    Marland Kitchen adapalene (DIFFERIN) 0.1 % gel Apply 1 application topically daily as needed (acne). Acne     . ALPRAZolam (XANAX) 0.5 MG tablet Take 0.5 mg by mouth at bedtime.     . cholecalciferol (VITAMIN D) 1000 units tablet Take 1,000 Units by mouth daily.    . citalopram (CELEXA)  20 MG tablet Take 20 mg by mouth at bedtime.     . DENTA 5000 PLUS 1.1 % CREA dental cream Place 1 application onto teeth 3 (three) times daily as needed. Brushes her teeth 2-3 times daily and uses each time she brushes  12  . diphenhydrAMINE (BENADRYL) 25 MG tablet Take 25 mg by mouth daily as needed for allergies.    . montelukast (SINGULAIR) 10 MG tablet Take 10 mg by mouth at bedtime.    Marland Kitchen PROAIR HFA 108 (90 Base) MCG/ACT inhaler Inhale 2 puffs into the lungs every 6 (six) hours as needed for wheezing or shortness of breath.   2  . rivaroxaban (XARELTO) 20 MG TABS  tablet Take 1 tablet (20 mg total) by mouth daily with supper. 30 tablet 6  . oxyCODONE-acetaminophen (ROXICET) 5-325 MG tablet Take 1-2 tablets by mouth every 4 (four) hours as needed. (Patient not taking: Reported on 04/06/2017) 60 tablet 0    No results found for this or any previous visit (from the past 48 hour(s)). No results found.  Review of Systems  All other systems reviewed and are negative.   Blood pressure 126/74, pulse 87, temperature 98.1 F (36.7 C), temperature source Oral, resp. rate 18, height 5\' 5"  (1.651 m), weight 181 lb (82.1 kg), last menstrual period 07/20/2017, SpO2 98 %. Physical Exam  Constitutional: She is oriented to person, place, and time. She appears well-developed and well-nourished.  HENT:  Head: Normocephalic and atraumatic.  Eyes: Pupils are equal, round, and reactive to light. EOM are normal.  Neck: Normal range of motion. Neck supple.  Cardiovascular: Normal rate and regular rhythm.   Respiratory: Effort normal and breath sounds normal.  GI: Soft. Bowel sounds are normal.  Musculoskeletal:       Left ankle: She exhibits decreased range of motion, swelling and deformity. Tenderness.  Neurological: She is alert and oriented to person, place, and time. She displays abnormal reflex.  Skin: Skin is warm and dry.  Psychiatric: She has a normal mood and affect.     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   Risks and benefits have been discussed in detail and informed consent is obtained  Mcarthur Rossetti, MD 07/20/2017, 12:25 PM

## 2017-07-20 NOTE — Anesthesia Postprocedure Evaluation (Signed)
Anesthesia Post Note  Patient: [name] Carr  Procedure(s) Performed: Procedure(s) (LRB): Bone grafting left distal tibia non-union, harvest graft from left femur (Left)     Patient location during evaluation: PACU Anesthesia Type: General Level of consciousness: awake and alert Pain management: pain level controlled Vital Signs Assessment: post-procedure vital signs reviewed and stable Respiratory status: spontaneous breathing, nonlabored ventilation, respiratory function stable and patient connected to nasal cannula oxygen Cardiovascular status: blood pressure returned to baseline and stable Postop Assessment: no apparent nausea or vomiting Anesthetic complications: no    Last Vitals:  Vitals:   07/20/17 1625 07/20/17 1640  BP: 98/63 103/61  Pulse: 84 77  Resp: 17 13  Temp:    SpO2: 94% 96%    Last Pain:  Vitals:   07/20/17 1640  TempSrc:   PainSc: Asleep                 Aigner Horseman DAVID

## 2017-07-20 NOTE — Transfer of Care (Signed)
Immediate Anesthesia Transfer of Care Note  Patient: [name] Carr  Procedure(s) Performed: Procedure(s): Bone grafting left distal tibia non-union, harvest graft from left femur (Left)  Patient Location: PACU  Anesthesia Type:General  Level of Consciousness: awake, alert  and oriented  Airway & Oxygen Therapy: Patient Spontanous Breathing and Patient connected to nasal cannula oxygen  Post-op Assessment: Report given to RN and Post -op Vital signs reviewed and stable  Post vital signs: Reviewed and stable  Last Vitals:  Vitals:   07/20/17 1002  BP: 126/74  Pulse: 87  Resp: 18  Temp: 36.7 C  SpO2: 98%    Last Pain:  Vitals:   07/20/17 1002  TempSrc: Oral      Patients Stated Pain Goal: 7 (95/09/32 6712)  Complications: No apparent anesthesia complications

## 2017-07-20 NOTE — Brief Op Note (Signed)
07/20/2017  3:00 PM  PATIENT:  [name] Carr  46 y.o. female  PRE-OPERATIVE DIAGNOSIS:  left distal tibia fracture non-union  POST-OPERATIVE DIAGNOSIS:  left distal tibia fracture non-union  PROCEDURE:  Procedure(s): Bone grafting left distal tibia non-union, harvest graft from left femur (Left)  SURGEON:  Surgeon(s) and Role:    Mcarthur Rossetti, MD - Primary  PHYSICIAN ASSISTANT: Benita Stabile, PA-C  ANESTHESIA:   general  EBL:  Total I/O In: 1000 [I.V.:1000] Out: -   COUNTS:  YES  DICTATION: .Other Dictation: Dictation Number [id number]  PLAN OF CARE: Admit to inpatient   PATIENT DISPOSITION:  PACU - hemodynamically stable.   Delay start of Pharmacological VTE agent (>24hrs) due to surgical blood loss or risk of bleeding: no

## 2017-07-20 NOTE — Anesthesia Preprocedure Evaluation (Addendum)
Anesthesia Evaluation  Patient identified by MRN, date of birth, ID band Patient awake    Reviewed: Allergy & Precautions, NPO status , Patient's Chart, lab work & pertinent test results  History of Anesthesia Complications (+) PONV  Airway Mallampati: II  TM Distance: >3 FB     Dental   Pulmonary asthma , pneumonia, former smoker,    breath sounds clear to auscultation       Cardiovascular negative cardio ROS   Rhythm:Regular Rate:Normal     Neuro/Psych    GI/Hepatic negative GI ROS, Neg liver ROS,   Endo/Other  negative endocrine ROS  Renal/GU negative Renal ROS     Musculoskeletal   Abdominal   Peds  Hematology   Anesthesia Other Findings   Reproductive/Obstetrics                             Anesthesia Physical Anesthesia Plan  ASA: III  Anesthesia Plan: General   Post-op Pain Management:    Induction: Intravenous  PONV Risk Score and Plan: 4 or greater and Ondansetron, Dexamethasone, Midazolam, Scopolamine patch - Pre-op, Propofol infusion and Treatment may vary due to age or medical condition  Airway Management Planned: Oral ETT  Additional Equipment:   Intra-op Plan:   Post-operative Plan: Extubation in OR  Informed Consent:   Plan Discussed with:   Anesthesia Plan Comments:        Anesthesia Quick Evaluation

## 2017-07-21 ENCOUNTER — Encounter (HOSPITAL_COMMUNITY): Payer: Self-pay | Admitting: Orthopaedic Surgery

## 2017-07-21 ENCOUNTER — Ambulatory Visit (INDEPENDENT_AMBULATORY_CARE_PROVIDER_SITE_OTHER): Payer: BLUE CROSS/BLUE SHIELD | Admitting: Orthopaedic Surgery

## 2017-07-21 DIAGNOSIS — S82302K Unspecified fracture of lower end of left tibia, subsequent encounter for closed fracture with nonunion: Secondary | ICD-10-CM | POA: Diagnosis not present

## 2017-07-21 MED ORDER — OXYCODONE-ACETAMINOPHEN 5-325 MG PO TABS: 1.0000 | ORAL_TABLET | ORAL | 0 refills | Status: DC | PRN

## 2017-07-21 MED ORDER — VITAMIN D 1000 UNITS PO TABS: 50000.0000 [IU] | ORAL_TABLET | ORAL | 0 refills | Status: DC

## 2017-07-21 NOTE — Discharge Summary (Signed)
Patient ID: [name] Carr MRN: [id number] DOB/AGE: Feb 19, 1971 46 y.o.  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 Anxiety   . Asthma   . Complication of anesthesia    one time had difficulty waking up  . IUD (intrauterine device) in place    Paraguard IUD inserted 2008  . Pneumonia    walking pneumonia  . PONV (postoperative nausea and vomiting)    'MANY MANY MANY years ago"  . Pre-diabetes    cnotrolled with diet an exercise    Surgeries: Procedure(s): Bone grafting left distal tibia non-union, harvest graft from left femur on 07/20/2017   Consultants:   Discharged Condition: Improved  Hospital Course: [name] Carr is an 46 y.o. female who was admitted 07/20/2017 for operative treatment ofDisplaced pilon fracture of left tibia, subsequent encounter for open fracture type IIIA, IIIB, or IIIC with nonunion. Patient has severe unremitting pain that affects sleep, daily activities, and work/hobbies. After pre-op clearance the patient was taken to the operating room on 07/20/2017 and underwent  Procedure(s): Bone grafting left distal tibia non-union, harvest graft from left femur.    Patient was given perioperative antibiotics: Anti-infectives    Start     Dose/Rate Route Frequency Ordered Stop   07/20/17 1930  ceFAZolin (ANCEF) IVPB 1 g/50 mL premix     1 g 100 mL/hr over 30 Minutes Intravenous Every 6 hours 07/20/17 1816 07/21/17 1329   07/20/17 1200  ceFAZolin (ANCEF) IVPB 2g/100 mL premix     2 g 200 mL/hr over 30 Minutes Intravenous On call to O.R. 07/19/17 0932 07/20/17 1253       Patient was given sequential compression devices, early ambulation, and  chemoprophylaxis to prevent DVT.  Patient benefited maximally from hospital stay and there were no complications.    Recent vital signs: Patient Vitals for the past 24 hrs:  BP Temp Temp src Pulse Resp SpO2 Height Weight  07/21/17 0515 (!) 110/58 98.3 F (36.8 C) Oral 73 17 100 % - -  07/20/17 2246 116/71 98.3 F (36.8 C) Oral 93 17 100 % - -  07/20/17 1833 111/68 97.7 F (36.5 C) Oral 77 - 100 % - -  07/20/17 1755 105/64 97.9 F (36.6 C) - 74 15 97 % - -  07/20/17 1740 105/66 - - 73 11 99 % - -  07/20/17 1725 101/62 - - 73 12 97 % - -  07/20/17 1710 104/71 - - 77 11 97 % - -  07/20/17 1655 101/65 - - 84 20 97 % - -  07/20/17 1640 103/61 97.6 F (36.4 C) - 77 13 96 % - -  07/20/17 1625 98/63 - - 84 17 94 % - -  07/20/17 1610 108/71 - - 83 13 99 % - -  07/20/17 1555 115/74 - - 84 16 98 % - -  07/20/17 1540 111/62 - - 87 14 96 % - -  07/20/17 1525 115/71 - - 88 15 100 % - -  07/20/17 1510 108/71 - - 85 17 100 % - -  07/20/17 1002 126/74 98.1 F (36.7 C) Oral 87 18 98 % 5\' 5"  (1.651 m) 181 lb (82.1 kg)  Recent laboratory studies: No results for input(s): WBC, HGB, HCT, PLT, NA, K, CL, CO2, BUN, CREATININE, GLUCOSE, INR, CALCIUM in the last 72 hours.  Invalid input(s): PT, 2   Discharge Medications:   Allergies as of 07/21/2017      Reactions   Morphine Shortness Of Breath   LIKELY DOSE RELATED   Latex Hives, Itching, Rash   Tetracycline Hives, Itching   Chlorhexidine Rash   Itching from CHG wipes in pre-op      Medication List    TAKE these medications   acetaminophen 500 MG tablet Commonly known as:  TYLENOL Take 500-1,000 mg by mouth every 8 (eight) hours as needed for mild pain or headache.   adapalene 0.1 % gel Commonly known as:  DIFFERIN Apply 1 application topically daily as needed (acne). Acne   ALPRAZolam 0.5 MG tablet Commonly known as:  XANAX Take 0.5 mg by mouth at bedtime.   cholecalciferol 1000 units tablet Commonly known as:  VITAMIN D Take  50 tablets (50,000 Units total) by mouth once a week. What changed:  how much to take  when to take this   citalopram 20 MG tablet Commonly known as:  CELEXA Take 20 mg by mouth at bedtime.   DENTA 5000 PLUS 1.1 % Crea dental cream Generic drug:  sodium fluoride Place 1 application onto teeth 3 (three) times daily as needed. Brushes her teeth 2-3 times daily and uses each time she brushes   diphenhydrAMINE 25 MG tablet Commonly known as:  BENADRYL Take 25 mg by mouth daily as needed for allergies.   montelukast 10 MG tablet Commonly known as:  SINGULAIR Take 10 mg by mouth at bedtime.   oxyCODONE-acetaminophen 5-325 MG tablet Commonly known as:  ROXICET Take 1-2 tablets by mouth every 4 (four) hours as needed.   PROAIR HFA 108 (90 Base) MCG/ACT inhaler Generic drug:  albuterol Inhale 2 puffs into the lungs every 6 (six) hours as needed for wheezing or shortness of breath.   rivaroxaban 20 MG Tabs tablet Commonly known as:  XARELTO Take 1 tablet (20 mg total) by mouth daily with supper.            Discharge Care Instructions        Start     Ordered   07/21/17 0000  cholecalciferol (VITAMIN D) 1000 units tablet  Weekly     07/21/17 0734   07/21/17 0000  oxyCODONE-acetaminophen (ROXICET) 5-325 MG tablet  Every 4 hours PRN     07/21/17 0734   07/21/17 0000  Discharge patient    Question Answer Comment  Discharge disposition 01-Home or Self Care   Discharge patient date 07/21/2017      07/21/17 0734      Diagnostic Studies: Dg Ankle 2 Views Left  Result Date: 07/20/2017 CLINICAL DATA:  Bone grafting. EXAM: LEFT ANKLE - 2 VIEW; DG C-ARM 61-120 MIN COMPARISON:  03/23/2017. FINDINGS: Postsurgical changes noted about the distal left tibia with bone graft material noted. Plate screw fixation of the distal tibia and fibula noted. Near anatomic alignment . IMPRESSION: Postsurgical changes left ankle as above. Electronically Signed   By: Marcello Moores  Register   On:  07/20/2017 14:48   Ct Ankle Left Wo Contrast  Result Date: 06/30/2017 CLINICAL DATA:  Left ankle fracture status post fixation and hardware removal. Evaluate for nonunion. EXAM: CT OF THE LEFT ANKLE WITHOUT CONTRAST TECHNIQUE: Multidetector CT imaging of the left ankle was performed according to the standard protocol. Multiplanar CT image  reconstructions were also generated. COMPARISON:  Ankle x-rays dated June 23, 2017. CT left ankle dated October 27, 2016. FINDINGS: Bones/Joint/Cartilage Postsurgical changes related to prior distal tibia and fibular fracture ORIF. Alignment is unchanged when compared to prior x-ray. The dominant lateral tibial fragment is partially united posteriorly at the tibial plafond, but remains nonunited anteriorly at the tibial plafond, with the nonunion extending superiorly and involving most of the tibial metaphysis. There is 1.3 cm of persistent distraction between the fracture fragments proximally. The distal fibular fracture remains nonunited proximally. The fracture is partially united distally. Unchanged mild apex medial angulation of the fracture fragments. The two most distal fibular plate screws do not appear to to have any osseous purchase. The talar dome is intact. The ankle mortise is symmetric. 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 Multiple ghost tracts are seen in the distal tibia and calcaneus. Ligaments Suboptimally assessed by CT. Muscles and Tendons No focal abnormality. Soft tissues No focal abnormality. IMPRESSION: 1. Postsurgical changes related to prior distal tibia and fibular fracture ORIF. The two most distal fibular plate screws do not appear to have any osseous purchase. 2. The majority of the dominant lateral tibial fragment remains nonunited, with only partial union seen posteriorly at the tibial plafond. Distraction between the fragments measures up to 1.3 cm. 3. Proximal nonunion of the distal fibular fracture. Partial union distally. Electronically Signed    By: Titus Dubin M.D.   On: 06/30/2017 14:49   Dg C-arm 61-120 Min  Result Date: 07/20/2017 CLINICAL DATA:  Bone grafting. EXAM: LEFT ANKLE - 2 VIEW; DG C-ARM 61-120 MIN COMPARISON:  03/23/2017. FINDINGS: Postsurgical changes noted about the distal left tibia with bone graft material noted. Plate screw fixation of the distal tibia and fibula noted. Near anatomic alignment . IMPRESSION: Postsurgical changes left ankle as above. Electronically Signed   By: Marcello Moores  Register   On: 07/20/2017 14:48   Xr Ankle Complete Left  Result Date: 07/01/2017 Overall alignment well maintained, but no significant healing yet.   Disposition: 01-Home or Self Care  Discharge Instructions    Discharge patient    Complete by:  As directed    Discharge disposition:  01-Home or Self Care   Discharge patient date:  07/21/2017      Follow-up Information    Mcarthur Rossetti, MD Follow up in 2 week(s).   Specialty:  Orthopedic Surgery Contact information: [place] [address] [phone]            Signed: Mcarthur Rossetti 07/21/2017, 7:34 AM

## 2017-07-21 NOTE — Discharge Instructions (Signed)
Only limited weight on your left leg. Ice and elevation as needed for swelling. Do move your ankle and bend it daily. Leave your current dressing on and in place until this coming Sunday. Starting Monday, you can get you leg wet in the shower, then new dry dressings daily. You can start using the bone stimulator after your dressings are removed. No ibuprofen or any NSAID.

## 2017-07-21 NOTE — Progress Notes (Signed)
Patient ID: [name] Carr, female   DOB: 1971/10/03, 46 y.o.   MRN: [id number] Comfortable this am.  Vitals stable.  Can be discharged to home today.

## 2017-07-21 NOTE — Progress Notes (Signed)
Discharge instructions (including medications) discussed with and copy provided to patient/caregiver 

## 2017-07-21 NOTE — Op Note (Signed)
NAME:  [name], Carr                   ACCOUNT NO.:  MEDICAL RECORD NO.:  [id number]  LOCATION:                                 FACILITY:  PHYSICIAN:  Lind Guest. 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  PROCEDURES: 1. Takedown of nonunion, left distal tibia fracture. 2. Autologous bone graft harvest from left femoral shaft using Synthes     RIA system. 3. Autologous bone grafting to left distal tibia fracture, nonunion     using autologous bone graft and synthetic cancellous bone chips.  SURGEON:  Lind Guest. Ninfa Linden, M.D.  ASSISTANT:  Erskine Emery, PA-C.  ANESTHESIA:  General.  BLOOD LOSS:  Less than 200 mL.  COMPLICATIONS:  None.  INDICATIONS:  [name] Carr is a 46 year old female, well known to me.  On January 1st of this year, she sustained a traumatic open pilon fracture of the left distal tibia.  The first surgery involved external fixation and cleaning of the open fracture.  Nine days later, we took her to the operating room for open reduction and internal fixation of the fracture. Over several months, she has been unable to heal that fracture and actually started to fail the fixation.  She was then later taken to the operating room where we removed all the fixation and placed new plates and screws in the different position and placed Infuse bone grafting material.  Since that period of time, she has felt better; however, she developed a blood clot, has been trying bone stimulator, but has been unable to heal the fracture, verified on clinical exam, plain films and CT scan.  At this point, we have recommended one more attempt at bone grafting with using her own autologous bone graft through the femoral shaft using the  Synthes RIA system to harvest bone graft and then taking down the nonunion and packing it completely with her own cancellous bone and synthetic bone chips.  The risks and benefits of this have been explained in detail and she does understand the reasoning behind proceeding with surgery.  PROCEDURE DESCRIPTION:  After informed consent was obtained, appropriate left ankle and left thigh were marked.  She was brought to the operating room and placed supine on the operating table.  General anesthesia was then obtained.  A bump was placed under her left hip and then she was prepped and draped from the hip and pelvis all the way down to the toes all on the left side.  Time-out was called and she was identified as correct patient and correct left ankle and left femur.  We then made a small incision over the previous plates, so I could dissect down the plate to make sure there was no obvious gross infection.  I had not seen infection at all and none of her labs or clinical exam had shown infection.  With that being said, then we proceeded with the bone grafting standpoint.  We made an incision on the lateral thigh,  the proximal femur, so we could dissect down the tip of the greater trochanter.  Using direct fluoroscopy, a guidepin was inserted at the tip of greater trochanter and initiating reamer was used to open up the femoral canal.  We then passed the Synthes RIA system with reaming the bone to be able to get out cancellous bone graft and bone marrow.  We then mixed this with some synthetic cancellous chips.  We then made a new incision over the distal anterolateral tibia and we were able to easily dissect down to the nonunion site.  We were able to take down the nonunion completely and removed all the fibrinous tissue to expose a deficit and void in the bone.  Once we got to good bleeding surface and this was verified under direct visualization and fluoroscopy to fill the void, we were able  to fill the void with packing it completely with bone grafting material.  Before we packed the bone graft, we did irrigate the wound as well.  We then closed the deep tissue with 0 Vicryl followed by 2-0 Vicryl in the subcutaneous tissue, interrupted nylon on the skin. Xeroform and well-padded sterile dressings applied.  She was awakened, extubated and taken to the recovery room in stable condition.  All final counts were correct.  There were no complications noted. Postoperatively, we were going to admit her for overnight observation with discharge to home tomorrow.  I will likely let her put partial weight on this while we tried to get some stimulation healing.  She will continue her bone stimulator as well.  Of note, Erskine Emery, PA-C assisted in the entire, his assistance was crucial for facilitating all aspects of this case.     Lind Guest. Ninfa Linden, M.D.     CYB/MEDQ  D:  07/20/2017  T:  07/21/2017  Job:  [id number]

## 2017-07-21 NOTE — Plan of Care (Signed)
Problem: Pain Managment: Goal: General experience of comfort will improve Patient voices understanding of pain scale and calls for pain medication when Needed

## 2017-08-03 ENCOUNTER — Ambulatory Visit (INDEPENDENT_AMBULATORY_CARE_PROVIDER_SITE_OTHER): Payer: BLUE CROSS/BLUE SHIELD

## 2017-08-03 ENCOUNTER — Encounter (INDEPENDENT_AMBULATORY_CARE_PROVIDER_SITE_OTHER): Payer: Self-pay | Admitting: Orthopaedic Surgery

## 2017-08-03 ENCOUNTER — Ambulatory Visit (INDEPENDENT_AMBULATORY_CARE_PROVIDER_SITE_OTHER): Payer: BLUE CROSS/BLUE SHIELD | Admitting: Orthopaedic Surgery

## 2017-08-03 DIAGNOSIS — S82872K Displaced pilon fracture of left tibia, subsequent encounter for closed fracture with nonunion: Secondary | ICD-10-CM

## 2017-08-03 NOTE — Addendum Note (Signed)
Addendum  created 08/03/17 1538 by Lillia Abed, MD   Anesthesia Event deleted, Anesthesia Event edited, Anesthesia Staff edited

## 2017-08-03 NOTE — Addendum Note (Signed)
Addendum  created 08/03/17 1540 by Lillia Abed, MD   Anesthesia Staff edited

## 2017-08-03 NOTE — Progress Notes (Signed)
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 The bone grafting was obtained from the femur. She reports that she is doing well and is not taking any pain medications. She is having some numbness and tingling in her foot overall. She denies any issues with her incisions. She's been using her bone stimulator as well and just touchdown weightbearing for pivoting. She's been in a Banker as well.  On examination all incisions look good. The tibial incisions have sutures removed which we will do today and placed Steri-Strips. She is able to plantarflex and dorsiflex her left foot as well.  X-rays of the left ankle show good alignment overall. He can see where the bone graft is then packed into the nonunion site and looks significant improved from preoperative films.  I'll let her try 25-50% weightbearing and continue with the bone stimulator as well. She'll continue the cam walker as well. She'll work on range motion of her ankle.  We will see her back in 4 weeks with 3 views of the left ankle x-rays.

## 2017-08-31 ENCOUNTER — Encounter (INDEPENDENT_AMBULATORY_CARE_PROVIDER_SITE_OTHER): Payer: Self-pay | Admitting: Orthopaedic Surgery

## 2017-08-31 ENCOUNTER — Ambulatory Visit (INDEPENDENT_AMBULATORY_CARE_PROVIDER_SITE_OTHER): Payer: BLUE CROSS/BLUE SHIELD

## 2017-08-31 ENCOUNTER — Other Ambulatory Visit (INDEPENDENT_AMBULATORY_CARE_PROVIDER_SITE_OTHER): Payer: Self-pay

## 2017-08-31 ENCOUNTER — Ambulatory Visit (INDEPENDENT_AMBULATORY_CARE_PROVIDER_SITE_OTHER): Payer: BLUE CROSS/BLUE SHIELD | Admitting: Orthopaedic Surgery

## 2017-08-31 DIAGNOSIS — S82872K Displaced pilon fracture of left tibia, subsequent encounter for closed fracture with nonunion: Secondary | ICD-10-CM

## 2017-08-31 DIAGNOSIS — I8290 Acute embolism and thrombosis of unspecified vein: Secondary | ICD-10-CM

## 2017-08-31 NOTE — Progress Notes (Signed)
The patient is following up 6 weeks status post bone grafting of a comminuted left distal tibia fracture nonunion.  She is in a cam walking boot and has been only partial weightbearing she states she is feeling much better overall.  On exam her calf is soft.  All incisions of healed nicely.  She is improving range of motion of her ankle in general.  3 views of the left ankle were obtained and reviewed the previous films and show interval healing with the fracture filling in.  At this point she will continue the bone stimulator.  A letter weight-bear as tolerated in cam walker but using a cane or crutch in her opposite hand to offload this further.  I will see her back in 4 weeks with repeat 3 views of her left ankle.  Obviously there is something going on before then she will let us know.  She also back off of it hurting too much.  We are also going to obtain a Doppler ultrasound of her left lower extremity to assess her DVT to see if she can come off blood thinning medication anytime soon.

## 2017-09-02 ENCOUNTER — Ambulatory Visit (HOSPITAL_COMMUNITY)
Admission: RE | Admit: 2017-09-02 | Discharge: 2017-09-02 | Disposition: A | Discharge status: Home / Self Care | Payer: BLUE CROSS/BLUE SHIELD | Source: Ambulatory Visit | Attending: Orthopaedic Surgery | Admitting: Orthopaedic Surgery

## 2017-09-02 DIAGNOSIS — I82812 Embolism and thrombosis of superficial veins of left lower extremities: Secondary | ICD-10-CM | POA: Insufficient documentation

## 2017-09-02 DIAGNOSIS — I8290 Acute embolism and thrombosis of unspecified vein: Secondary | ICD-10-CM | POA: Diagnosis present

## 2017-09-02 NOTE — Progress Notes (Signed)
LLE venous duplex prelim: negative for DVT. Chronic SVT Lt small saphenous vein. Landry Mellow, RDMS, RVT Attempted call report to Dr. Ninfa Linden, left voice message.

## 2017-09-07 IMAGING — DX DG ANKLE PORT 2V*L*
1 series · 2 of 2 positions shown · non-contrast
Comparison: Intraoperative exam same date. 03/11/2017. 11/03/2016.
10/26/2016.

CLINICAL DATA: 46-year-old female for removal of hardware and
revision of internal fixation. Subsequent encounter.

EXAM:
PORTABLE LEFT ANKLE - 2 VIEW

[Series 1: ankle · 0.14mm/px · 2 of 2 slices shown]
[im 1/2]
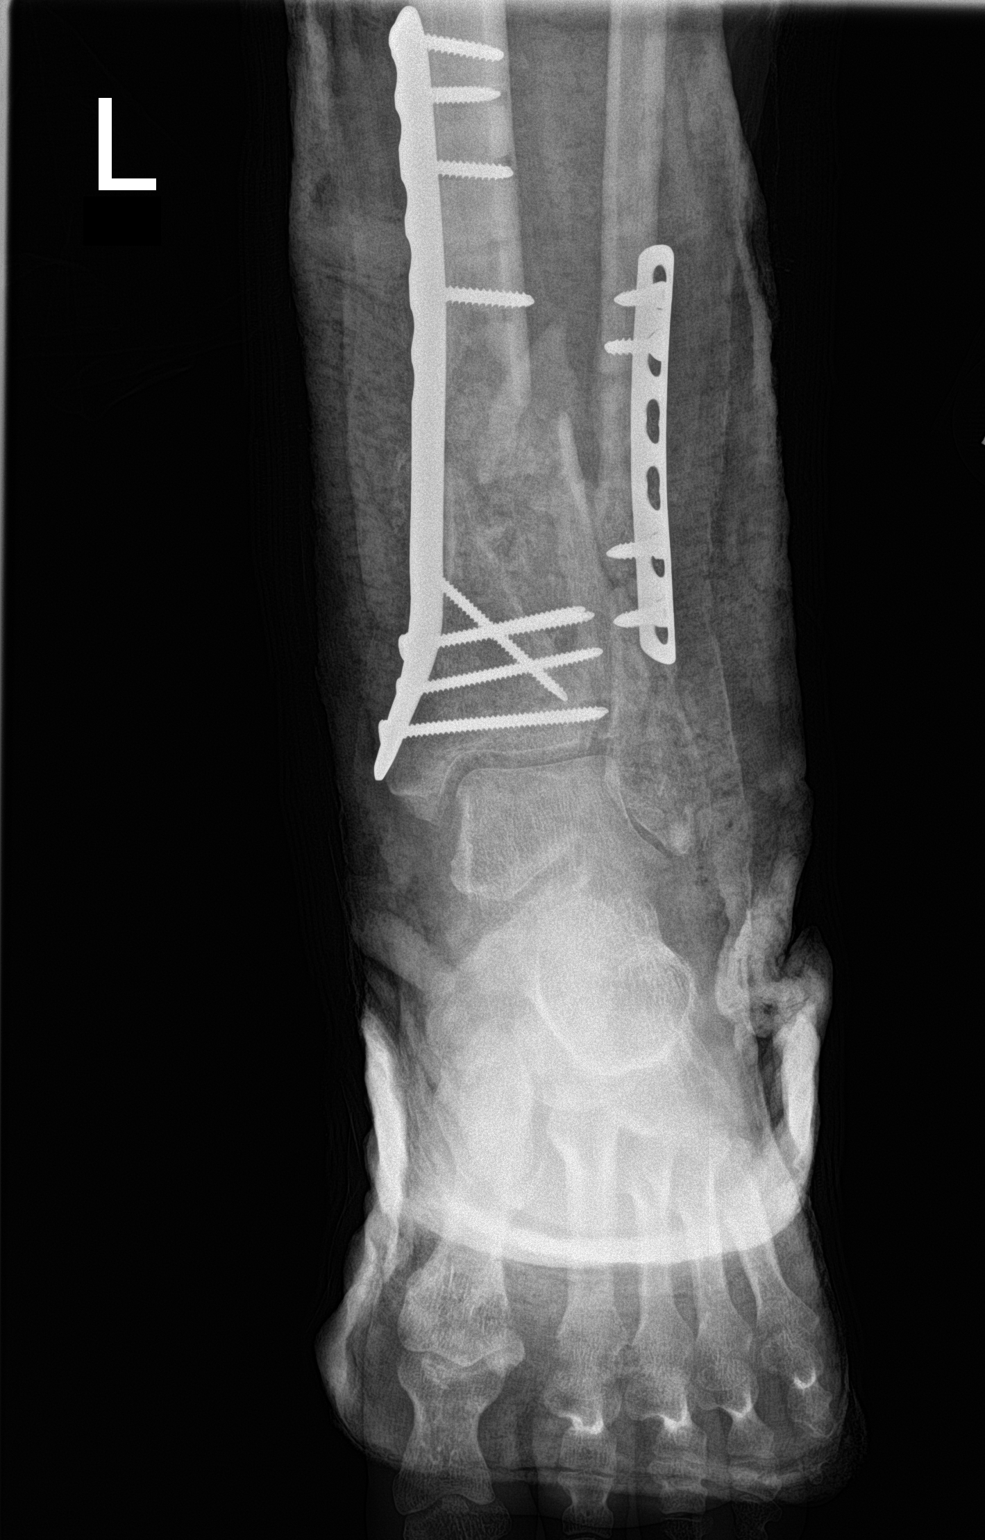
[im 2/2]
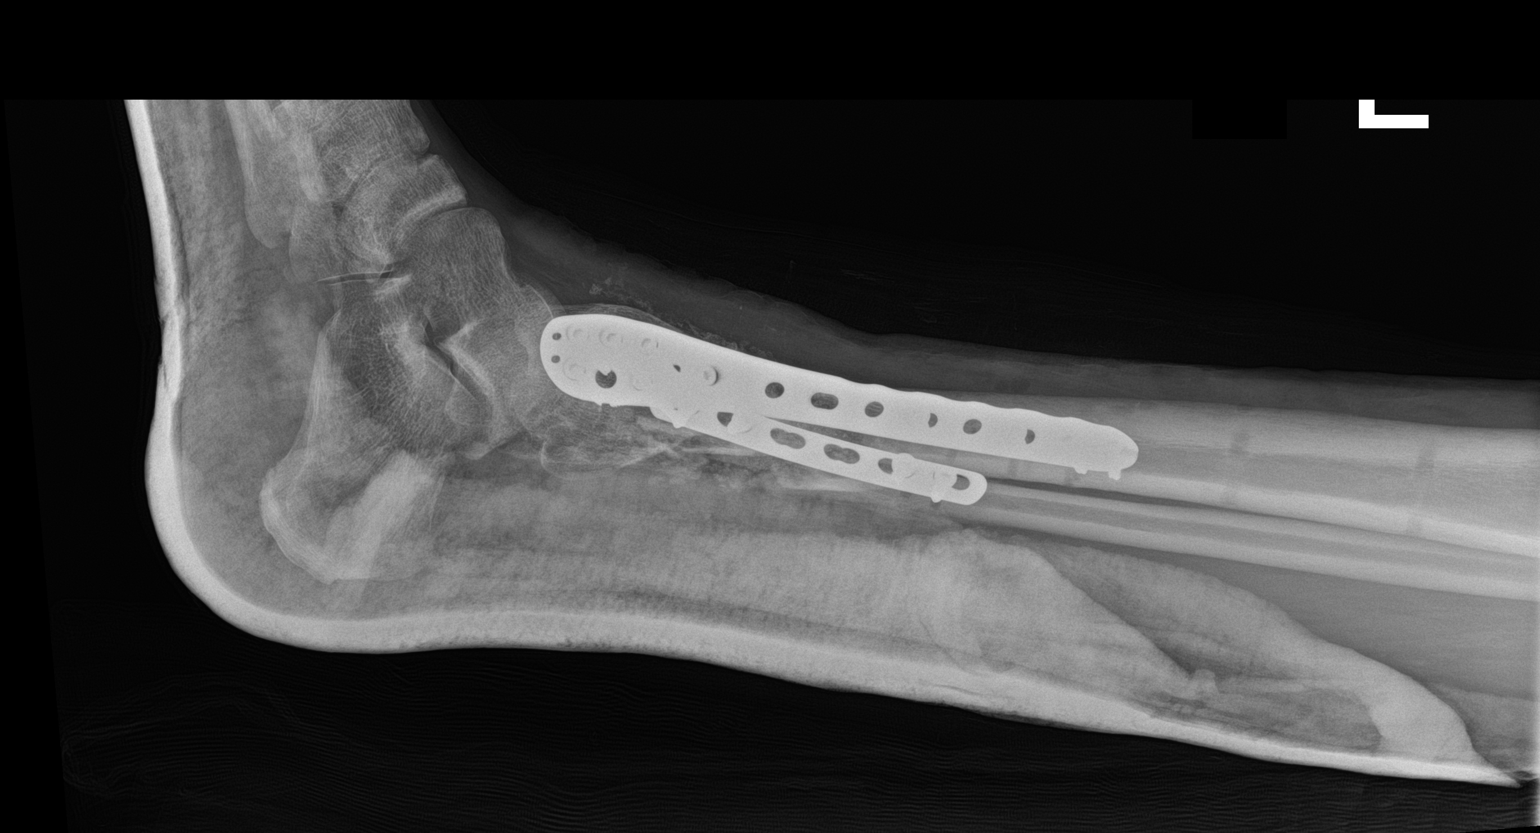

[2 of 2 positions shown; findings below may reference images not displayed]

FINDINGS: Splint obscures fine osseous and soft tissue detail.

Revision of open reduction and internal fixation of comminuted
distal left tibia and fibular fracture. Incomplete healing of the
comminuted fracture with slight displacement of smaller fracture
fragments from the main fracture fragments.
IMPRESSION: Revision distal left tibia-fibula internal fixation.

## 2017-09-28 ENCOUNTER — Ambulatory Visit (INDEPENDENT_AMBULATORY_CARE_PROVIDER_SITE_OTHER): Payer: BLUE CROSS/BLUE SHIELD | Admitting: Orthopaedic Surgery

## 2017-09-29 ENCOUNTER — Ambulatory Visit (INDEPENDENT_AMBULATORY_CARE_PROVIDER_SITE_OTHER): Payer: BLUE CROSS/BLUE SHIELD

## 2017-09-29 ENCOUNTER — Ambulatory Visit (INDEPENDENT_AMBULATORY_CARE_PROVIDER_SITE_OTHER): Payer: BLUE CROSS/BLUE SHIELD | Admitting: Orthopaedic Surgery

## 2017-09-29 DIAGNOSIS — S82872K Displaced pilon fracture of left tibia, subsequent encounter for closed fracture with nonunion: Secondary | ICD-10-CM | POA: Diagnosis not present

## 2017-09-29 NOTE — Progress Notes (Signed)
The patient is now 9 weeks status post open reduction and bone grafting of a significant left distal tibia fracture.  She has had multiple surgeries on his distal tibia.  She is using a bone stimulator that is now malfunctioning.  I do feel that this is medically necessary at this point given the delayed union and past nonunion that she has had of this left distal tibia fracture.  I do feel the bone grafting is working.  She is walking around better and still using a Cam walker and walking with a limp.  Her pain is been lessening.  On exam she is got excellent range of motion of the left ankle.  There is no severe malalignment deformities that I can see.  Her incisions of all well-healed.  3 views left ankle are obtained and compared to previous films and there is been some interval healing in some areas and not in other areas.  There is no evidence of hardware failure.  At this point we need to have the bone stimulator replaced because I do feel that it is medically necessary to get substantial healing for her distal tibia fracture.  I would like to see her back in 4 weeks with repeat 3 views of her left ankle.

## 2017-10-06 ENCOUNTER — Telehealth (INDEPENDENT_AMBULATORY_CARE_PROVIDER_SITE_OTHER): Payer: Self-pay | Admitting: Radiology

## 2017-10-06 NOTE — Telephone Encounter (Signed)
Ok to refill vitamin D2 1 capsule once a week for 12 weeks?  And give the whole three month supply?

## 2017-10-06 NOTE — Telephone Encounter (Signed)
Ok to refill. Thanks 

## 2017-10-07 MED ORDER — VITAMIN D 1000 UNITS PO TABS: 50000.0000 [IU] | ORAL_TABLET | ORAL | 0 refills | Status: DC

## 2017-10-07 NOTE — Telephone Encounter (Signed)
Sent to pharm ....

## 2017-10-26 DIAGNOSIS — I82409 Acute embolism and thrombosis of unspecified deep veins of unspecified lower extremity: Secondary | ICD-10-CM

## 2017-10-26 HISTORY — PX: IUD REMOVAL: SHX5392

## 2017-10-26 HISTORY — DX: Acute embolism and thrombosis of unspecified deep veins of unspecified lower extremity: I82.409

## 2017-10-28 ENCOUNTER — Ambulatory Visit (INDEPENDENT_AMBULATORY_CARE_PROVIDER_SITE_OTHER): Payer: BLUE CROSS/BLUE SHIELD

## 2017-10-28 ENCOUNTER — Ambulatory Visit (INDEPENDENT_AMBULATORY_CARE_PROVIDER_SITE_OTHER): Payer: BLUE CROSS/BLUE SHIELD | Admitting: Orthopaedic Surgery

## 2017-10-28 ENCOUNTER — Encounter (INDEPENDENT_AMBULATORY_CARE_PROVIDER_SITE_OTHER): Payer: Self-pay | Admitting: Orthopaedic Surgery

## 2017-10-28 DIAGNOSIS — S82872K Displaced pilon fracture of left tibia, subsequent encounter for closed fracture with nonunion: Secondary | ICD-10-CM

## 2017-10-28 NOTE — Progress Notes (Signed)
The patient is now 3 months status post open reduction internal fixation with revision bone grafting of the pilon fracture of the left ankle.  Her original open fracture occurred 1 year ago on October 26, 2016.  She has had multiple surgeries since then trying to get this area to heal.  She is using a bone stimulator now.  She has minimal pain at this point.  She is been ambulating with weightbearing as tolerated using a Cam walker.  On exam she has improved ankle and knee range of motion on the left side.  There is no significant swelling or severe malalignment.  The wounds are all healed.  3 views of the ankle are obtained and showed to her and compared to previous films.  There is some slight interval healing.  I would like to see more healing but there is no evidence of lucency around the hardware or failure of the hardware.  At this point we will transition her to an ASO peer to still avoid high impact activities.  I like to see her back in 6 weeks with repeat 3 views of her left ankle.  All questions concerns were answered and addressed.  Obviously if she is having pain or worsening problems before then she will let us know.  She will continue the bone stimulator as well.

## 2017-12-08 ENCOUNTER — Ambulatory Visit (INDEPENDENT_AMBULATORY_CARE_PROVIDER_SITE_OTHER): Payer: BLUE CROSS/BLUE SHIELD

## 2017-12-08 ENCOUNTER — Ambulatory Visit (INDEPENDENT_AMBULATORY_CARE_PROVIDER_SITE_OTHER): Payer: BLUE CROSS/BLUE SHIELD | Admitting: Orthopaedic Surgery

## 2017-12-08 ENCOUNTER — Encounter (INDEPENDENT_AMBULATORY_CARE_PROVIDER_SITE_OTHER): Payer: Self-pay | Admitting: Orthopaedic Surgery

## 2017-12-08 DIAGNOSIS — S82872K Displaced pilon fracture of left tibia, subsequent encounter for closed fracture with nonunion: Secondary | ICD-10-CM | POA: Diagnosis not present

## 2017-12-08 NOTE — Progress Notes (Signed)
The patient is now 4-1/2 months status post revision fixation of a distal tibia fracture.  At that time she underwent bone grafting of a autologous bone as well as replating.  She is been undergoing a bone stimulator as well.  We may let her weight-bear as tolerated.  Her pain is been minimal at this point.  On examination her leg grossly at the ankle.  Straight.  She has good excellent range of motion of her knee and her ankle.  3 views of the left ankle are obtained and compared to x-rays from January 3 of this year and show slight interval healing.  The fracture has not consolidated fully yet.  There is no evidence of hardware failure.  This point should continue weightbearing as tolerated in her lace up ankle brace.  She will continue the bone stimulator.  I like to see her back at this point in 2 months with a repeat 3 views left ankle.

## 2017-12-25 ENCOUNTER — Other Ambulatory Visit (INDEPENDENT_AMBULATORY_CARE_PROVIDER_SITE_OTHER): Payer: Self-pay | Admitting: Orthopaedic Surgery

## 2017-12-27 NOTE — Telephone Encounter (Signed)
She is off of this already (does not need)

## 2017-12-27 NOTE — Telephone Encounter (Signed)
Continue

## 2017-12-31 ENCOUNTER — Emergency Department (HOSPITAL_COMMUNITY): Payer: BLUE CROSS/BLUE SHIELD

## 2017-12-31 ENCOUNTER — Inpatient Hospital Stay (HOSPITAL_COMMUNITY)
Admission: EM | Admit: 2017-12-31 | Discharge: 2018-01-07 | DRG: 493 | Disposition: A | Discharge status: Home / Self Care | Payer: BLUE CROSS/BLUE SHIELD | Attending: Orthopedic Surgery | Admitting: Orthopedic Surgery

## 2017-12-31 ENCOUNTER — Telehealth (INDEPENDENT_AMBULATORY_CARE_PROVIDER_SITE_OTHER): Payer: Self-pay | Admitting: Orthopaedic Surgery

## 2017-12-31 ENCOUNTER — Other Ambulatory Visit: Payer: Self-pay

## 2017-12-31 ENCOUNTER — Encounter (HOSPITAL_COMMUNITY): Payer: Self-pay | Admitting: Emergency Medicine

## 2017-12-31 ENCOUNTER — Other Ambulatory Visit (INDEPENDENT_AMBULATORY_CARE_PROVIDER_SITE_OTHER): Payer: Self-pay

## 2017-12-31 ENCOUNTER — Inpatient Hospital Stay (HOSPITAL_COMMUNITY): Payer: BLUE CROSS/BLUE SHIELD

## 2017-12-31 DIAGNOSIS — Z975 Presence of (intrauterine) contraceptive device: Secondary | ICD-10-CM | POA: Diagnosis not present

## 2017-12-31 DIAGNOSIS — D62 Acute posthemorrhagic anemia: Secondary | ICD-10-CM | POA: Diagnosis not present

## 2017-12-31 DIAGNOSIS — S82832N Other fracture of upper and lower end of left fibula, subsequent encounter for open fracture type IIIA, IIIB, or IIIC with nonunion: Secondary | ICD-10-CM

## 2017-12-31 DIAGNOSIS — E669 Obesity, unspecified: Secondary | ICD-10-CM | POA: Diagnosis present

## 2017-12-31 DIAGNOSIS — F419 Anxiety disorder, unspecified: Secondary | ICD-10-CM | POA: Diagnosis present

## 2017-12-31 DIAGNOSIS — Y839 Surgical procedure, unspecified as the cause of abnormal reaction of the patient, or of later complication, without mention of misadventure at the time of the procedure: Secondary | ICD-10-CM | POA: Diagnosis present

## 2017-12-31 DIAGNOSIS — S82402K Unspecified fracture of shaft of left fibula, subsequent encounter for closed fracture with nonunion: Secondary | ICD-10-CM

## 2017-12-31 DIAGNOSIS — R7303 Prediabetes: Secondary | ICD-10-CM | POA: Diagnosis present

## 2017-12-31 DIAGNOSIS — E559 Vitamin D deficiency, unspecified: Secondary | ICD-10-CM | POA: Diagnosis present

## 2017-12-31 DIAGNOSIS — E8889 Other specified metabolic disorders: Secondary | ICD-10-CM | POA: Diagnosis present

## 2017-12-31 DIAGNOSIS — S82872N Displaced pilon fracture of left tibia, subsequent encounter for open fracture type IIIA, IIIB, or IIIC with nonunion: Secondary | ICD-10-CM | POA: Diagnosis not present

## 2017-12-31 DIAGNOSIS — J45909 Unspecified asthma, uncomplicated: Secondary | ICD-10-CM | POA: Diagnosis present

## 2017-12-31 DIAGNOSIS — S82872K Displaced pilon fracture of left tibia, subsequent encounter for closed fracture with nonunion: Principal | ICD-10-CM

## 2017-12-31 DIAGNOSIS — Z6831 Body mass index (BMI) 31.0-31.9, adult: Secondary | ICD-10-CM | POA: Diagnosis not present

## 2017-12-31 DIAGNOSIS — M869 Osteomyelitis, unspecified: Secondary | ICD-10-CM | POA: Diagnosis present

## 2017-12-31 DIAGNOSIS — Z885 Allergy status to narcotic agent status: Secondary | ICD-10-CM | POA: Diagnosis not present

## 2017-12-31 DIAGNOSIS — X501XXA Overexertion from prolonged static or awkward postures, initial encounter: Secondary | ICD-10-CM | POA: Diagnosis not present

## 2017-12-31 DIAGNOSIS — M25572 Pain in left ankle and joints of left foot: Secondary | ICD-10-CM | POA: Diagnosis present

## 2017-12-31 DIAGNOSIS — Z87891 Personal history of nicotine dependence: Secondary | ICD-10-CM

## 2017-12-31 DIAGNOSIS — T84117A Breakdown (mechanical) of internal fixation device of bone of left lower leg, initial encounter: Secondary | ICD-10-CM | POA: Diagnosis present

## 2017-12-31 DIAGNOSIS — Z79899 Other long term (current) drug therapy: Secondary | ICD-10-CM | POA: Diagnosis not present

## 2017-12-31 DIAGNOSIS — Z888 Allergy status to other drugs, medicaments and biological substances status: Secondary | ICD-10-CM | POA: Diagnosis not present

## 2017-12-31 DIAGNOSIS — M879 Osteonecrosis, unspecified: Secondary | ICD-10-CM | POA: Diagnosis present

## 2017-12-31 DIAGNOSIS — Z833 Family history of diabetes mellitus: Secondary | ICD-10-CM | POA: Diagnosis not present

## 2017-12-31 DIAGNOSIS — Z91040 Latex allergy status: Secondary | ICD-10-CM | POA: Diagnosis not present

## 2017-12-31 DIAGNOSIS — Z419 Encounter for procedure for purposes other than remedying health state, unspecified: Secondary | ICD-10-CM

## 2017-12-31 DIAGNOSIS — W1839XA Other fall on same level, initial encounter: Secondary | ICD-10-CM | POA: Diagnosis present

## 2017-12-31 DIAGNOSIS — Z86718 Personal history of other venous thrombosis and embolism: Secondary | ICD-10-CM

## 2017-12-31 DIAGNOSIS — S82892A Other fracture of left lower leg, initial encounter for closed fracture: Secondary | ICD-10-CM

## 2017-12-31 DIAGNOSIS — Z7901 Long term (current) use of anticoagulants: Secondary | ICD-10-CM | POA: Diagnosis not present

## 2017-12-31 DIAGNOSIS — Z881 Allergy status to other antibiotic agents status: Secondary | ICD-10-CM | POA: Diagnosis not present

## 2017-12-31 DIAGNOSIS — J45990 Exercise induced bronchospasm: Secondary | ICD-10-CM | POA: Diagnosis present

## 2017-12-31 HISTORY — DX: Acute embolism and thrombosis of unspecified deep veins of unspecified lower extremity: I82.409

## 2017-12-31 LAB — BASIC METABOLIC PANEL
Anion gap: 9 (ref 5–15)
BUN: 6 mg/dL (ref 6–20)
CHLORIDE: 105 mmol/L (ref 101–111)
CO2: 23 mmol/L (ref 22–32)
CREATININE: 0.62 mg/dL (ref 0.44–1.00)
Calcium: 9.1 mg/dL (ref 8.9–10.3)
GFR calc non Af Amer: >60 mL/min (ref >60)
Glucose, Bld: 117 mg/dL — ABNORMAL HIGH (ref 65–99)
POTASSIUM: 3.7 mmol/L (ref 3.5–5.1)
Sodium: 137 mmol/L (ref 135–145)

## 2017-12-31 LAB — SEDIMENTATION RATE: SED RATE: 16 mm/h (ref 0–22)

## 2017-12-31 LAB — CBC
HEMATOCRIT: 37.6 % (ref 36.0–46.0)
HEMOGLOBIN: 12.2 g/dL (ref 12.0–15.0)
MCH: 27.4 pg (ref 26.0–34.0)
MCHC: 32.4 g/dL (ref 30.0–36.0)
MCV: 84.3 fL (ref 78.0–100.0)
Platelets: 401 10*3/uL — ABNORMAL HIGH (ref 150–400)
RBC: 4.46 MIL/uL (ref 3.87–5.11)
RDW: 17.1 % — ABNORMAL HIGH (ref 11.5–15.5)
WBC: 11.5 10*3/uL — ABNORMAL HIGH (ref 4.0–10.5)

## 2017-12-31 LAB — C-REACTIVE PROTEIN

## 2017-12-31 MED ORDER — MONTELUKAST SODIUM 10 MG PO TABS
10.0000 mg | ORAL_TABLET | Freq: Every day | ORAL | Status: DC
  Administered 2017-12-31 – 2018-01-06 (×7): 10 mg via ORAL
  Filled 2017-12-31 (×7): qty 1

## 2017-12-31 MED ORDER — METHOCARBAMOL 500 MG PO TABS
500.0000 mg | ORAL_TABLET | Freq: Four times a day (QID) | ORAL | Status: DC | PRN
  Administered 2017-12-31 – 2018-01-03 (×12): 500 mg via ORAL
  Filled 2017-12-31 (×13): qty 1

## 2017-12-31 MED ORDER — DIPHENHYDRAMINE HCL 12.5 MG/5ML PO ELIX: 12.5000 mg | ORAL_SOLUTION | ORAL | Status: DC | PRN

## 2017-12-31 MED ORDER — ACETAMINOPHEN 650 MG RE SUPP: 650.0000 mg | Freq: Four times a day (QID) | RECTAL | Status: DC | PRN

## 2017-12-31 MED ORDER — OXYCODONE HCL 5 MG PO TABS
5.0000 mg | ORAL_TABLET | ORAL | Status: DC | PRN
  Administered 2017-12-31 – 2018-01-01 (×3): 5 mg via ORAL
  Administered 2018-01-01: 10 mg via ORAL
  Administered 2018-01-01 – 2018-01-04 (×12): 5 mg via ORAL
  Filled 2017-12-31 (×12): qty 1
  Filled 2017-12-31: qty 2
  Filled 2017-12-31 (×2): qty 1
  Filled 2017-12-31: qty 2

## 2017-12-31 MED ORDER — ALBUTEROL SULFATE (2.5 MG/3ML) 0.083% IN NEBU
2.5000 mg | INHALATION_SOLUTION | Freq: Four times a day (QID) | RESPIRATORY_TRACT | Status: DC | PRN
Start: 2017-12-31 — End: 2018-01-07

## 2017-12-31 MED ORDER — DEXTROSE 5 % IV SOLN
500.0000 mg | Freq: Four times a day (QID) | INTRAVENOUS | Status: DC | PRN
  Filled 2017-12-31: qty 5

## 2017-12-31 MED ORDER — DOXYCYCLINE HYCLATE 100 MG PO CAPS: 100.0000 mg | ORAL_CAPSULE | Freq: Two times a day (BID) | ORAL | 0 refills | Status: DC

## 2017-12-31 MED ORDER — ALPRAZOLAM 0.5 MG PO TABS
0.5000 mg | ORAL_TABLET | Freq: Every day | ORAL | Status: DC
  Administered 2017-12-31 – 2018-01-06 (×7): 0.5 mg via ORAL
  Filled 2017-12-31 (×7): qty 1

## 2017-12-31 MED ORDER — CITALOPRAM HYDROBROMIDE 20 MG PO TABS
20.0000 mg | ORAL_TABLET | Freq: Every day | ORAL | Status: DC
  Administered 2017-12-31 – 2018-01-06 (×7): 20 mg via ORAL
  Filled 2017-12-31 (×7): qty 1

## 2017-12-31 MED ORDER — ACETAMINOPHEN 325 MG PO TABS
650.0000 mg | ORAL_TABLET | Freq: Four times a day (QID) | ORAL | Status: DC | PRN
  Administered 2017-12-31 – 2018-01-03 (×9): 650 mg via ORAL
  Filled 2017-12-31 (×9): qty 2

## 2017-12-31 MED ORDER — ALBUTEROL SULFATE HFA 108 (90 BASE) MCG/ACT IN AERS: 2.0000 | INHALATION_SPRAY | Freq: Four times a day (QID) | RESPIRATORY_TRACT | Status: DC | PRN

## 2017-12-31 NOTE — ED Triage Notes (Signed)
Patient c/o left tibia and ankle pain, ankle is swollen and painful to the touch. Hx of compound fx of tibia and fibula in 10/2016. Last sx for same complication was in September 2018, pain started Monday night and has gotten worse throughout the week. NWB on left side, patient has ortho boot on.

## 2017-12-31 NOTE — Telephone Encounter (Signed)
Please advise 

## 2017-12-31 NOTE — ED Notes (Signed)
MD Ninfa Linden at bedside.

## 2017-12-31 NOTE — ED Provider Notes (Signed)
[facility] EMERGENCY DEPARTMENT Provider Note   CSN: [id number] Arrival date & time: 12/31/17  1600     History   Chief Complaint Chief Complaint  Patient presents with  . Leg Pain    HPI [name] Carr is a 47 y.o. female.  The history is provided by the patient. No language interpreter was used.  Leg Pain   This is a new problem. The current episode started more than 2 days ago. The problem occurs constantly. The problem has been gradually worsening. The pain is present in the left lower leg and left ankle. The quality of the pain is described as aching. The pain is moderate. The symptoms are aggravated by standing. She has tried nothing for the symptoms. The treatment provided no relief. There has been no history of extremity trauma.  Pt complains of pain in the left ankle and left leg.    Past Medical History:  Diagnosis Date  . Anxiety   . Asthma   . Complication of anesthesia    one time had difficulty waking up  . IUD (intrauterine device) in place    Paraguard IUD inserted 2008  . Pneumonia    walking pneumonia  . PONV (postoperative nausea and vomiting)    'MANY MANY MANY years ago"  . Pre-diabetes    cnotrolled with diet an exercise    Patient Active Problem List   Diagnosis Date Noted  . Displaced pilon fracture of left tibia, subsequent encounter for open fracture type IIIA, IIIB, or IIIC with nonunion 07/20/2017  . 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 Displaced pilon fracture of left tibia, sequela 06/23/2017  . 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 Pilon fracture of left tibia, sequela 11/03/2016  . Open fracture of tibia and fibula, left, type I or II, initial encounter 10/26/2016  . Open fracture of left tibia and fibula, type I or II, initial encounter 10/26/2016    Past Surgical History:  Procedure Laterality Date  . BREAST SURGERY     reduction  . EXTERNAL  FIXATION LEG Left 10/26/2016   Procedure: EXTERNAL FIXATION ANKLE ;  Surgeon: Mcarthur Rossetti, MD;  Location: [facility];  Service: Orthopedics;  Laterality: Left;  . EXTERNAL FIXATION REMOVAL Left 11/03/2016   Procedure: REMOVAL EXTERNAL FIXATION ANKLE;  Surgeon: Mcarthur Rossetti, MD;  Location: [facility];  Service: Orthopedics;  Laterality: Left;  . FRACTURE SURGERY     not until 10/2016  . HARDWARE REMOVAL Left 03/23/2017   Procedure: HARDWARE REMOVAL;  Surgeon: Mcarthur Rossetti, MD;  Location: [facility];  Service: Orthopedics;  Laterality: Left;  . IRRIGATION AND DEBRIDEMENT FOOT Left 10/26/2016   Procedure: IRRIGATION AND DEBRIDEMENT;  Surgeon: Mcarthur Rossetti, MD;  Location: [facility];  Service: Orthopedics;  Laterality: Left;  . ORIF ANKLE FRACTURE Left 11/03/2016   Procedure: OPEN REDUCTION INTERNAL FIXATION (ORIF) LEFT PILON FRACTURE;  Surgeon: Mcarthur Rossetti, MD;  Location: [facility];  Service: Orthopedics;  Laterality: Left;  . ORIF ANKLE FRACTURE Left 03/23/2017   Procedure: REMOVAL OF HARDWARE LEFT ANKLE, REVISION OPEN REDUCTION INTERNAL FIXATION (ORIF) LEFT PILON FRACTURE, BONE GRAFT LEFT TIBIA;  Surgeon: Mcarthur Rossetti, MD;  Location: [facility];  Service: Orthopedics;  Laterality: Left;  . ORIF ANKLE FRACTURE Left 07/20/2017   Procedure: Bone grafting left distal tibia non-union, harvest graft from left femur;  Surgeon: Mcarthur Rossetti, MD;  Location: [facility];  Service:  Orthopedics;  Laterality: Left;  . PILONIDAL CYST EXCISION      OB History    No data available       Home Medications    Prior to Admission medications   Medication Sig Start Date End Date Taking? Authorizing Provider  acetaminophen (TYLENOL) 500 MG tablet Take 500-1,000 mg by mouth every 8 (eight) hours as needed for mild pain or headache.    [provider]  adapalene (DIFFERIN) 0.1 % gel Apply 1 application topically daily as needed (acne). Acne  05/11/13   [provider]  ALPRAZolam Duanne Moron) 0.5 MG tablet Take 0.5 mg by mouth at bedtime.     [provider]  cholecalciferol (VITAMIN D) 1000 units tablet Take 50 tablets (50,000 Units total) by mouth once a week. 10/07/17   Marybelle Killings, MD  citalopram (CELEXA) 20 MG tablet Take 20 mg by mouth at bedtime.     [provider]  DENTA 5000 PLUS 1.1 % CREA dental cream Place 1 application onto teeth 3 (three) times daily as needed. Brushes her teeth 2-3 times daily and uses each time she brushes 02/24/17   [provider]  diphenhydrAMINE (BENADRYL) 25 MG tablet Take 25 mg by mouth daily as needed for allergies.    [provider]  doxycycline (VIBRAMYCIN) 100 MG capsule Take 1 capsule (100 mg total) by mouth 2 (two) times daily. 12/31/17   Mcarthur Rossetti, MD  montelukast (SINGULAIR) 10 MG tablet Take 10 mg by mouth at bedtime.    [provider]  oxyCODONE-acetaminophen (ROXICET) 5-325 MG tablet Take 1-2 tablets by mouth every 4 (four) hours as needed. Patient not taking: Reported on 08/03/2017 07/21/17   Mcarthur Rossetti, MD  PROAIR HFA [id number] [phone] Base) MCG/ACT inhaler Inhale 2 puffs into the lungs every 6 (six) hours as needed for wheezing or shortness of breath.  03/08/17   [provider]  rivaroxaban (XARELTO) 20 MG TABS tablet Take 1 tablet (20 mg total) by mouth daily with supper. 05/24/17   Mcarthur Rossetti, MD    Family History Family History  Problem Relation Age of Onset  . Breast cancer Mother   . Diabetes Mellitus II Father     Social History Social History   Tobacco Use  . Smoking status: Former Research scientist (life sciences)  . Smokeless tobacco: Never Used  Substance Use Topics  . Alcohol use: Yes    Comment: social  . Drug use: No     Allergies   Morphine; Latex; Tetracycline; and Chlorhexidine   Review of Systems Review of Systems  Musculoskeletal: Positive for arthralgias, joint swelling and myalgias.  All other systems reviewed and  are negative.    Physical Exam Updated Vital Signs Ht 5\' 5"  (1.651 m)   Wt 72 kg (158 lb 11.7 oz)   LMP 12/10/2017 (Approximate)   BMI 26.41 kg/m   Physical Exam  Constitutional: She is oriented to person, place, and time. She appears well-developed and well-nourished.  Musculoskeletal: Normal range of motion. She exhibits tenderness.  Swollen left ankle,  Left lower leg swelling  nv and ns intact   Neurological: She is alert and oriented to person, place, and time.  Skin: Skin is warm.  Psychiatric: She has a normal mood and affect.  Nursing note and vitals reviewed.    ED Treatments / Results  Labs (all labs ordered are listed, but only abnormal results are displayed) Labs Reviewed - No data to display  EKG  EKG  Interpretation None       Radiology Dg Tibia/fibula Left  Result Date: 12/31/2017 CLINICAL DATA:  New leg swelling. EXAM: LEFT TIBIA AND FIBULA - 2 VIEW COMPARISON:  None. FINDINGS: Remote distal tibia and fibular shaft fractures with ORIF. Bone graft seen previously has incorporated. Medial tibial plate and screws with new plate fracture at the level of the nonunited tibia fracture. There is new valgus and apex anterior angulation. No acute hardware failure of distal fibular plate, but there is no union at the level of the fibular fracture, which is also angulated. Soft tissue swelling. Prominent osteopenia. Tracks related to previous external fixation hardware. No proximal injury is seen. IMPRESSION: Remote but nonunited and newly angulated distal tibial and fibular shaft fractures. Interval tibial plate fracture. Electronically Signed   By: Monte Fantasia M.D.   On: 12/31/2017 17:14   Dg Ankle Complete Left  Result Date: 12/31/2017 CLINICAL DATA:  Left tibia pain. EXAM: LEFT ANKLE COMPLETE - 3+ VIEW COMPARISON:  08/03/2017 FINDINGS: Remote distal tibia and fibular shaft fractures with ORIF. Bone graft seen previously has incorporated. Medial tibial plate and  screws with new plate fracture at the level of the nonunited tibia fracture. There is new valgus and apex anterior angulation. No acute hardware failure of distal fibular plate, but there is no union at the level of the fibular fracture, which is also angulated. Soft tissue swelling. Prominent osteopenia. IMPRESSION: Remote but nonunited and newly angulated distal tibial and fibular shaft fractures. Interval tibial plate fracture. Electronically Signed   By: Monte Fantasia M.D.   On: 12/31/2017 17:13    Procedures Procedures (including critical care time)  Medications Ordered in ED Medications - No data to display   Initial Impression / Assessment and Plan / ED Course  I have reviewed the triage vital signs and the nursing notes.  Pertinent labs & imaging results that were available during my care of the patient were reviewed by me and considered in my medical decision making (see chart for details).     Dr. Ninfa Linden here to see pt.  He will admit pt for further evaluation/surgery.   Final Clinical Impressions(s) / ED Diagnoses   Final diagnoses:  Closed fracture of left ankle, initial encounter    ED Discharge Orders    None       Sidney Ace 12/31/17 2037    Drenda Freeze, MD 01/03/18 [phone]

## 2017-12-31 NOTE — Telephone Encounter (Signed)
Patient called asked if Dr Ninfa Linden would write a Rx for  Keflex for her because her leg is swollen and red. (Leg is warm to touch) Patient has an appointment Tuesday. The number to contact patient is [phone]

## 2017-12-31 NOTE — H&P (Signed)
[name] Carr is an 47 y.o. female.   Chief Complaint: Left ankle pain HPI: The patient is a 47 year old female well-known to me.  I first performed surgery on her on October 26, 2016 after she sustained a significant open left tibia/fibula (pilon) fracture.  This fracture was severely comminuted and significantly open.  Although it was from a mechanical fall with a twisting injury with a getting her leg caught in a metal grate, this presented as if it were a high-energy trauma.  She first underwent external fixation with irrigation and debridement the day she presented with her trauma.  Over week later she was taken for open reduction internal fixation with plating of the fibula and an anterior lateral plate of the tibia.  She developed nonunion and hardware failure with this surgery and in May 2018 underwent a second surgery with removal of failed hardware and medial plating of the tibia and allograft bone grafting.  By September 2018 in spite of efforts to try to get this to heal including bone stimulation she is not healing the fracture.  She still showed no evidence of infection.  She was taken back to the operating room in September 2018 and underwent autologous bone grafting of her fracture.  I kept her nonweightbearing and with serial follow-up x-rays she started to seem like she was going to heal her fracture.  Over the last few days she developed worsening pain with her tibia and then felt a pop and presented to the emergency room where x-rays confirmed failure of her medial plate of the left tibia and obvious continued nonunion of her fracture.  Past Medical History:  Diagnosis Date  . Anxiety   . Asthma   . Complication of anesthesia    one time had difficulty waking up  . IUD (intrauterine device) in place    Paraguard IUD inserted 2008  . Pneumonia    walking pneumonia  . PONV (postoperative nausea and vomiting)    'MANY MANY MANY years ago"  . Pre-diabetes    cnotrolled with diet an  exercise    Past Surgical History:  Procedure Laterality Date  . BREAST SURGERY     reduction  . EXTERNAL FIXATION LEG Left 10/26/2016   Procedure: EXTERNAL FIXATION ANKLE ;  Surgeon: Mcarthur Rossetti, MD;  Location: [facility];  Service: Orthopedics;  Laterality: Left;  . EXTERNAL FIXATION REMOVAL Left 11/03/2016   Procedure: REMOVAL EXTERNAL FIXATION ANKLE;  Surgeon: Mcarthur Rossetti, MD;  Location: [place];  Service: Orthopedics;  Laterality: Left;  . FRACTURE SURGERY     not until 10/2016  . HARDWARE REMOVAL Left 03/23/2017   Procedure: HARDWARE REMOVAL;  Surgeon: Mcarthur Rossetti, MD;  Location: [place];  Service: Orthopedics;  Laterality: Left;  . IRRIGATION AND DEBRIDEMENT FOOT Left 10/26/2016   Procedure: IRRIGATION AND DEBRIDEMENT;  Surgeon: Mcarthur Rossetti, MD;  Location: [facility];  Service: Orthopedics;  Laterality: Left;  . ORIF ANKLE FRACTURE Left 11/03/2016   Procedure: OPEN REDUCTION INTERNAL FIXATION (ORIF) LEFT PILON FRACTURE;  Surgeon: Mcarthur Rossetti, MD;  Location: [place];  Service: Orthopedics;  Laterality: Left;  . ORIF ANKLE FRACTURE Left 03/23/2017   Procedure: REMOVAL OF HARDWARE LEFT ANKLE, REVISION OPEN REDUCTION INTERNAL FIXATION (ORIF) LEFT PILON FRACTURE, BONE GRAFT LEFT TIBIA;  Surgeon: Mcarthur Rossetti, MD;  Location: [place];  Service: Orthopedics;  Laterality: Left;  . ORIF ANKLE FRACTURE Left 07/20/2017   Procedure: Bone grafting left distal tibia non-union, harvest graft from  left femur;  Surgeon: Mcarthur Rossetti, MD;  Location: [place];  Service: Orthopedics;  Laterality: Left;  . PILONIDAL CYST EXCISION      Family History  Problem Relation Age of Onset  . Breast cancer Mother   . Diabetes Mellitus II Father    Social History:  reports that she has quit smoking. she has never used smokeless tobacco. She reports that she drinks alcohol. She reports that she does not use drugs.  Allergies:  Allergies  Allergen Reactions  .  Morphine Shortness Of Breath    LIKELY DOSE RELATED  . Latex Hives, Itching and Rash  . Tetracycline Hives and Itching  . Chlorhexidine Rash    Itching from CHG wipes in pre-op     (Not in a hospital admission)  No results found for this or any previous visit (from the past 48 hour(s)). Dg Tibia/fibula Left  Result Date: 12/31/2017 CLINICAL DATA:  New leg swelling. EXAM: LEFT TIBIA AND FIBULA - 2 VIEW COMPARISON:  None. FINDINGS: Remote distal tibia and fibular shaft fractures with ORIF. Bone graft seen previously has incorporated. Medial tibial plate and screws with new plate fracture at the level of the nonunited tibia fracture. There is new valgus and apex anterior angulation. No acute hardware failure of distal fibular plate, but there is no union at the level of the fibular fracture, which is also angulated. Soft tissue swelling. Prominent osteopenia. Tracks related to previous external fixation hardware. No proximal injury is seen. IMPRESSION: Remote but nonunited and newly angulated distal tibial and fibular shaft fractures. Interval tibial plate fracture. Electronically Signed   By: Monte Fantasia M.D.   On: 12/31/2017 17:14   Dg Ankle Complete Left  Result Date: 12/31/2017 CLINICAL DATA:  Left tibia pain. EXAM: LEFT ANKLE COMPLETE - 3+ VIEW COMPARISON:  08/03/2017 FINDINGS: Remote distal tibia and fibular shaft fractures with ORIF. Bone graft seen previously has incorporated. Medial tibial plate and screws with new plate fracture at the level of the nonunited tibia fracture. There is new valgus and apex anterior angulation. No acute hardware failure of distal fibular plate, but there is no union at the level of the fibular fracture, which is also angulated. Soft tissue swelling. Prominent osteopenia. IMPRESSION: Remote but nonunited and newly angulated distal tibial and fibular shaft fractures. Interval tibial plate fracture. Electronically Signed   By: Monte Fantasia M.D.   On: 12/31/2017  17:13    Review of Systems  All other systems reviewed and are negative.   Height 5\' 5"  (1.651 m), weight 158 lb 11.7 oz (72 kg), last menstrual period 12/10/2017. Physical Exam  Constitutional: She is oriented to person, place, and time. She appears well-developed and well-nourished.  HENT:  Head: Normocephalic and atraumatic.  Eyes: EOM are normal. Pupils are equal, round, and reactive to light.  Neck: Normal range of motion. Neck supple.  Cardiovascular: Normal rate and regular rhythm.  Respiratory: Effort normal.  GI: Soft. Bowel sounds are normal.  Musculoskeletal:       Left ankle: She exhibits deformity.  Neurological: She is alert and oriented to person, place, and time.  Skin: Skin is warm and dry.  Psychiatric: She has a normal mood and affect.    There is an obvious deformity of her left tibia.  There is no soft tissue compromise and her incisions are well-healed.  There is no redness or cellulitis.  There is no area of open wounds or drainage.  Her foot is well-perfused with protective sensation  intact and no significant neuropathy.   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  In spite of multiple surgeries she is not been able to heal this fracture.  She is not a smoker at this point and is a diabetic who is under excellent control.  She is not on any diabetic medications anymore either.  She is even tried a bone stimulator and in spite of several surgeries has not been able to heal this fracture.  At this point I would admit her to the hospital and obtain a CT scan of her tibia as well as obtain labs involving  the assessment of infectious parameters.  She will be nonweightbearing for now with ice and elevation and pain medication as needed.  I will likely consult the orthopedic trauma service with orthopedic traumatologists Dr. Marcelino Scot and Dr. Doreatha Martin for their assessment and expertise.  She understands  this plan as well.  Mcarthur Rossetti, MD 12/31/2017, 7:32 PM

## 2017-12-31 NOTE — Telephone Encounter (Signed)
Actually call in doxycycline 100 mg twice daily, #30

## 2017-12-31 NOTE — Telephone Encounter (Signed)
Patient aware this was called into pharmacy  

## 2018-01-01 NOTE — Progress Notes (Signed)
Patient ID: [name] Carr, female   DOB: Mar 23, 1971, 47 y.o.   MRN: [id number] The patient is very comfortable this morning.  Her only lab result that was elevated was a slight and her white blood cell count last night but that was mainly due to the pain she was having.  Her sed rate and CRP are both normal.  She still shows no signs of infection and I believe this nonunion is most likely from atrophic bone.  I did obtain a CT scan of the ankle and is quite evident there is areas where the fracture never healed and just with time and the stress of this she fatigued the plate.  At this standpoint I am going to seek consultation from our orthopedic traumatologist  colleagues for further evaluation and treatment.  This will be both Dr. Marcelino Scot and Dr. Doreatha Martin that I will asked to review her chart.  She understands this fully.  Again she is comfortable for now.  I will draw a vitamin D level.  Of note she has never been fully a diabetic.  At one point she states she was prediabetic and was on metformin and she has not had any issues with blood sugars in a very long period time nor is she on blood glucose medication.  She is also not a smoker.

## 2018-01-02 NOTE — Progress Notes (Signed)
Patient ID: [name] Carr, female   DOB: Aug 12, 1971, 47 y.o.   MRN: [id number] There were no acute changes of the last 24 hours.  She seems to be comfortable and doing well.  She is up showering today.  She is remaining nonweightbearing on her left lower extremity.  There is still shows no evidence of infection or open wound.  I have put in a call to my orthopedic trauma experts for their input on this difficult case.  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  We will come up with a plan within the next 24 hours on what the next steps are in terms of proceeding with surgery and when.  She understands this fully.

## 2018-01-02 NOTE — Progress Notes (Signed)
I have been contacted by Dr. Ninfa Linden re patient's unfortunate clinical course following severely comminuted open left distal tib fib fractures. Given the location and complexity of this fracture nonunion, Dr. Ninfa Linden asserted this was outside his scope of practice and that it would be in the best interest of the patient to have these injuries evaluated and treated by a fellowship trained orthopaedic traumatologist. Consequently, I was consulted to provide further evaluation and management. Because of pain and instability she has been admitted to Dr. Ninfa Linden. We will proceed with formal consultation and assessment tomorrow with a developed treatment plan likely including removal, culture, and spacer placement followed by definitve reconstruction.  [place]e [place], MD [facility] [phone] (p)

## 2018-01-02 NOTE — Plan of Care (Signed)
  Nutrition: Adequate nutrition will be maintained 01/02/2018 1013 - Progressing by Williams Che, RN   Elimination: Will not experience complications related to bowel motility 01/02/2018 1013 - Progressing by Williams Che, RN   Pain Managment: General experience of comfort will improve 01/02/2018 1013 - Progressing by Williams Che, RN   Safety: Ability to remain free from injury will improve 01/02/2018 1013 - Progressing by Williams Che, RN

## 2018-01-03 ENCOUNTER — Encounter (HOSPITAL_COMMUNITY): Payer: Self-pay | Admitting: Orthopedic Surgery

## 2018-01-03 DIAGNOSIS — E559 Vitamin D deficiency, unspecified: Secondary | ICD-10-CM

## 2018-01-03 LAB — COMPREHENSIVE METABOLIC PANEL
ALK PHOS: 104 U/L (ref 38–126)
ALT: 17 U/L (ref 14–54)
ANION GAP: 10 (ref 5–15)
AST: 18 U/L (ref 15–41)
Albumin: 3.6 g/dL (ref 3.5–5.0)
BILIRUBIN TOTAL: 0.6 mg/dL (ref 0.3–1.2)
BUN: 10 mg/dL (ref 6–20)
CALCIUM: 9.1 mg/dL (ref 8.9–10.3)
CO2: 23 mmol/L (ref 22–32)
CREATININE: 0.69 mg/dL (ref 0.44–1.00)
Chloride: 103 mmol/L (ref 101–111)
Glucose, Bld: 102 mg/dL — ABNORMAL HIGH (ref 65–99)
Potassium: 3.7 mmol/L (ref 3.5–5.1)
SODIUM: 136 mmol/L (ref 135–145)
TOTAL PROTEIN: 6.9 g/dL (ref 6.5–8.1)

## 2018-01-03 LAB — RAPID URINE DRUG SCREEN, HOSP PERFORMED
AMPHETAMINES: NOT DETECTED
BENZODIAZEPINES: POSITIVE — AB
Barbiturates: NOT DETECTED
Cocaine: NOT DETECTED
Opiates: NOT DETECTED
Tetrahydrocannabinol: NOT DETECTED

## 2018-01-03 LAB — HEMOGLOBIN A1C
HEMOGLOBIN A1C: 5.5 % (ref 4.8–5.6)
Mean Plasma Glucose: 111.15 mg/dL

## 2018-01-03 LAB — URINALYSIS, ROUTINE W REFLEX MICROSCOPIC
Bilirubin Urine: NEGATIVE
Glucose, UA: NEGATIVE mg/dL
Hgb urine dipstick: NEGATIVE
KETONES UR: NEGATIVE mg/dL
LEUKOCYTES UA: NEGATIVE
Nitrite: NEGATIVE
PH: 6 (ref 5.0–8.0)
Protein, ur: NEGATIVE mg/dL
Specific Gravity, Urine: 1.01 (ref 1.005–1.030)

## 2018-01-03 LAB — PHOSPHORUS: Phosphorus: 4 mg/dL (ref 2.5–4.6)

## 2018-01-03 LAB — VITAMIN D 25 HYDROXY (VIT D DEFICIENCY, FRACTURES): Vit D, 25-Hydroxy: 16.8 ng/mL — ABNORMAL LOW (ref 30.0–100.0)

## 2018-01-03 LAB — PREALBUMIN: PREALBUMIN: 26 mg/dL (ref 18–38)

## 2018-01-03 LAB — MAGNESIUM: MAGNESIUM: 1.9 mg/dL (ref 1.7–2.4)

## 2018-01-03 LAB — PREGNANCY, URINE: PREG TEST UR: NEGATIVE

## 2018-01-03 LAB — TSH: TSH: 2.814 u[IU]/mL (ref 0.350–4.500)

## 2018-01-03 MED ORDER — LACTATED RINGERS IV SOLN
INTRAVENOUS | Status: DC
  Administered 2018-01-03: 125 mL/h via INTRAVENOUS
  Administered 2018-01-04 (×2): via INTRAVENOUS

## 2018-01-03 MED ORDER — CHLORHEXIDINE GLUCONATE 4 % EX LIQD
CUTANEOUS | Status: AC
  Administered 2018-01-03: 1
  Filled 2018-01-03: qty 15
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
## 2018-01-03 NOTE — Progress Notes (Signed)
Patient ID: [name] Carr, female   DOB: June 23, 1971, 47 y.o.   MRN: [id number] I have again spoken to the patient in detail and she understands the need for additional surgery.  Awaiting to hear from Dr. Marcelino Scot, Ortho Traumatologist as to his recommendations for further surgical options.  Likely surgery tomorrow (Tuesday).  Will follow-up on that today as to a definitive plan.

## 2018-01-03 NOTE — Anesthesia Preprocedure Evaluation (Addendum)
Anesthesia Evaluation  Patient identified by MRN, date of birth, ID band Patient awake    Reviewed: Allergy & Precautions, NPO status , Patient's Chart, lab work & pertinent test results  History of Anesthesia Complications (+) PONV and history of anesthetic complications  Airway Mallampati: I  TM Distance: >3 FB Neck ROM: Full    Dental no notable dental hx.    Pulmonary asthma , former smoker,    Pulmonary exam normal breath sounds clear to auscultation       Cardiovascular + DVT  Normal cardiovascular exam Rhythm:Regular Rate:Normal     Neuro/Psych Anxiety negative neurological ROS     GI/Hepatic negative GI ROS, Neg liver ROS,   Endo/Other  negative endocrine ROS  Renal/GU negative Renal ROS     Musculoskeletal negative musculoskeletal ROS (+)   Abdominal (+) + obese,   Peds  Hematology  (+) anemia ,   Anesthesia Other Findings left tibia nonunion  Reproductive/Obstetrics hcg negative                            Anesthesia Physical Anesthesia Plan  ASA: II  Anesthesia Plan: General   Post-op Pain Management:    Induction: Intravenous  PONV Risk Score and Plan: 4 or greater and Scopolamine patch - Pre-op, Midazolam, Dexamethasone, Ondansetron and Treatment may vary due to age or medical condition  Airway Management Planned: Oral ETT  Additional Equipment:   Intra-op Plan:   Post-operative Plan: Extubation in OR  Informed Consent: I have reviewed the patients History and Physical, chart, labs and discussed the procedure including the risks, benefits and alternatives for the proposed anesthesia with the patient or authorized representative who has indicated his/her understanding and acceptance.   Dental advisory given  Plan Discussed with: CRNA  Anesthesia Plan Comments:        Anesthesia Quick Evaluation

## 2018-01-04 ENCOUNTER — Inpatient Hospital Stay (HOSPITAL_COMMUNITY): Payer: BLUE CROSS/BLUE SHIELD | Admitting: Anesthesiology

## 2018-01-04 ENCOUNTER — Inpatient Hospital Stay (HOSPITAL_COMMUNITY): Payer: BLUE CROSS/BLUE SHIELD

## 2018-01-04 ENCOUNTER — Ambulatory Visit (INDEPENDENT_AMBULATORY_CARE_PROVIDER_SITE_OTHER): Payer: BLUE CROSS/BLUE SHIELD | Admitting: Orthopaedic Surgery

## 2018-01-04 ENCOUNTER — Encounter (HOSPITAL_COMMUNITY)
Admission: EM | Disposition: A | Discharge status: Home / Self Care | Payer: Self-pay | Source: Home / Self Care | Attending: Orthopaedic Surgery

## 2018-01-04 HISTORY — PX: HARDWARE REMOVAL: SHX979

## 2018-01-04 HISTORY — PX: EXTERNAL FIXATION LEG: SHX1549

## 2018-01-04 LAB — SEX HORMONE BINDING GLOBULIN: Sex Hormone Binding: 66.2 nmol/L (ref 24.6–122.0)

## 2018-01-04 LAB — PTH, INTACT AND CALCIUM
Calcium, Total (PTH): 9.6 mg/dL (ref 8.7–10.2)
PTH: 40 pg/mL (ref 15–65)

## 2018-01-04 LAB — CBC WITH DIFFERENTIAL/PLATELET
BASOS ABS: 0.1 10*3/uL (ref 0.0–0.1)
BASOS PCT: 1 %
Eosinophils Absolute: 0.2 10*3/uL (ref 0.0–0.7)
Eosinophils Relative: 3 %
HCT: 35.3 % — ABNORMAL LOW (ref 36.0–46.0)
Hemoglobin: 11.2 g/dL — ABNORMAL LOW (ref 12.0–15.0)
Lymphocytes Relative: 33 %
Lymphs Abs: 2.6 10*3/uL (ref 0.7–4.0)
MCH: 27 pg (ref 26.0–34.0)
MCHC: 31.7 g/dL (ref 30.0–36.0)
MCV: 85.1 fL (ref 78.0–100.0)
MONO ABS: 0.7 10*3/uL (ref 0.1–1.0)
Monocytes Relative: 9 %
NEUTROS PCT: 54 %
Neutro Abs: 4.4 10*3/uL (ref 1.7–7.7)
Platelets: 360 10*3/uL (ref 150–400)
RBC: 4.15 MIL/uL (ref 3.87–5.11)
RDW: 17.2 % — ABNORMAL HIGH (ref 11.5–15.5)
WBC: 8 10*3/uL (ref 4.0–10.5)

## 2018-01-04 LAB — COPPER, SERUM: Copper: 153 ug/dL (ref 72–166)

## 2018-01-04 LAB — PROTIME-INR
INR: 1.07
PROTHROMBIN TIME: 13.8 s (ref 11.4–15.2)

## 2018-01-04 LAB — CALCITRIOL (1,25 DI-OH VIT D): Vit D, 1,25-Dihydroxy: 32.2 pg/mL (ref 19.9–79.3)

## 2018-01-04 LAB — PROTEIN ELECTROPHORESIS, SERUM
A/G Ratio: 1.2 (ref 0.7–1.7)
Albumin ELP: 3.7 g/dL (ref 2.9–4.4)
Alpha-1-Globulin: 0.1 g/dL (ref 0.0–0.4)
Alpha-2-Globulin: 0.7 g/dL (ref 0.4–1.0)
Beta Globulin: 1.2 g/dL (ref 0.7–1.3)
Gamma Globulin: 1 g/dL (ref 0.4–1.8)
Globulin, Total: 3.1 g/dL (ref 2.2–3.9)
Total Protein ELP: 6.8 g/dL (ref 6.0–8.5)

## 2018-01-04 LAB — LIPID PANEL
CHOLESTEROL: 176 mg/dL (ref 0–200)
HDL: 51 mg/dL (ref >40)
LDL CALC: 107 mg/dL — AB (ref 0–99)
Total CHOL/HDL Ratio: 3.5 RATIO
Triglycerides: 91 mg/dL (ref <150)
VLDL: 18 mg/dL (ref 0–40)

## 2018-01-04 LAB — TESTOSTERONE: Testosterone: 15 ng/dL (ref 8–48)

## 2018-01-04 LAB — SURGICAL PCR SCREEN
MRSA, PCR: NEGATIVE
Staphylococcus aureus: NEGATIVE

## 2018-01-04 LAB — TESTOSTERONE, FREE: Testosterone, Free: 1.5 pg/mL (ref 0.0–4.2)

## 2018-01-04 LAB — APTT: aPTT: 30 seconds (ref 24–36)

## 2018-01-04 LAB — CALCIUM, IONIZED: CALCIUM, IONIZED, SERUM: 5.2 mg/dL (ref 4.5–5.6)

## 2018-01-04 SURGERY — REMOVAL, HARDWARE
Anesthesia: General | Laterality: Left

## 2018-01-04 MED ORDER — ROCURONIUM BROMIDE 10 MG/ML (PF) SYRINGE
PREFILLED_SYRINGE | INTRAVENOUS | Status: DC | PRN
  Administered 2018-01-04: 10 mg via INTRAVENOUS
  Administered 2018-01-04: 40 mg via INTRAVENOUS
  Administered 2018-01-04: 10 mg via INTRAVENOUS
  Administered 2018-01-04 (×3): 20 mg via INTRAVENOUS

## 2018-01-04 MED ORDER — VANCOMYCIN HCL 1000 MG IV SOLR
INTRAVENOUS | Status: DC | PRN
  Administered 2018-01-04: 1000 mg

## 2018-01-04 MED ORDER — OXYCODONE HCL 5 MG PO TABS
10.0000 mg | ORAL_TABLET | ORAL | Status: DC | PRN
  Administered 2018-01-04 – 2018-01-05 (×5): 15 mg via ORAL
  Filled 2018-01-04 (×5): qty 3

## 2018-01-04 MED ORDER — PROMETHAZINE HCL 25 MG/ML IJ SOLN: 6.2500 mg | INTRAMUSCULAR | Status: DC | PRN

## 2018-01-04 MED ORDER — VANCOMYCIN HCL IN DEXTROSE 1-5 GM/200ML-% IV SOLN
INTRAVENOUS | Status: AC
  Filled 2018-01-04: qty 200

## 2018-01-04 MED ORDER — HYDROMORPHONE HCL 1 MG/ML IJ SOLN
1.0000 mg | INTRAMUSCULAR | Status: DC | PRN
  Administered 2018-01-04 – 2018-01-05 (×2): 1 mg via INTRAVENOUS
  Filled 2018-01-04 (×2): qty 1

## 2018-01-04 MED ORDER — MIDAZOLAM HCL 2 MG/2ML IJ SOLN
INTRAMUSCULAR | Status: AC
  Filled 2018-01-04: qty 2

## 2018-01-04 MED ORDER — HYDROMORPHONE HCL 1 MG/ML IJ SOLN
0.2500 mg | INTRAMUSCULAR | Status: DC | PRN
  Administered 2018-01-04 (×3): 0.5 mg via INTRAVENOUS

## 2018-01-04 MED ORDER — LIDOCAINE HCL (CARDIAC) 20 MG/ML IV SOLN
INTRAVENOUS | Status: AC
  Filled 2018-01-04: qty 5

## 2018-01-04 MED ORDER — ROCURONIUM BROMIDE 10 MG/ML (PF) SYRINGE
PREFILLED_SYRINGE | INTRAVENOUS | Status: AC
  Filled 2018-01-04: qty 10

## 2018-01-04 MED ORDER — MIDAZOLAM HCL 2 MG/2ML IJ SOLN
INTRAMUSCULAR | Status: DC | PRN
  Administered 2018-01-04: 2 mg via INTRAVENOUS

## 2018-01-04 MED ORDER — DEXAMETHASONE SODIUM PHOSPHATE 10 MG/ML IJ SOLN
INTRAMUSCULAR | Status: DC | PRN
  Administered 2018-01-04: 10 mg via INTRAVENOUS

## 2018-01-04 MED ORDER — SUGAMMADEX SODIUM 200 MG/2ML IV SOLN
INTRAVENOUS | Status: DC | PRN
  Administered 2018-01-04: 200 mg via INTRAVENOUS

## 2018-01-04 MED ORDER — VANCOMYCIN HCL 1000 MG IV SOLR
INTRAVENOUS | Status: AC
  Filled 2018-01-04: qty 4000

## 2018-01-04 MED ORDER — OXYCODONE HCL 5 MG/5ML PO SOLN: 5.0000 mg | Freq: Once | ORAL | Status: DC | PRN

## 2018-01-04 MED ORDER — TOBRAMYCIN SULFATE 1.2 G IJ SOLR
INTRAMUSCULAR | Status: AC
  Filled 2018-01-04: qty 4.8

## 2018-01-04 MED ORDER — PROPOFOL 10 MG/ML IV BOLUS
INTRAVENOUS | Status: AC
  Filled 2018-01-04: qty 20

## 2018-01-04 MED ORDER — ONDANSETRON HCL 4 MG/2ML IJ SOLN
INTRAMUSCULAR | Status: DC | PRN
  Administered 2018-01-04: 4 mg via INTRAVENOUS

## 2018-01-04 MED ORDER — SUGAMMADEX SODIUM 200 MG/2ML IV SOLN
INTRAVENOUS | Status: AC
  Filled 2018-01-04: qty 2

## 2018-01-04 MED ORDER — LACTATED RINGERS IV SOLN
INTRAVENOUS | Status: DC | PRN
  Administered 2018-01-04 (×2): via INTRAVENOUS

## 2018-01-04 MED ORDER — OXYCODONE HCL 5 MG PO TABS: 5.0000 mg | ORAL_TABLET | Freq: Once | ORAL | Status: DC | PRN

## 2018-01-04 MED ORDER — LIDOCAINE 2% (20 MG/ML) 5 ML SYRINGE
INTRAMUSCULAR | Status: DC | PRN
  Administered 2018-01-04: 80 mg via INTRAVENOUS

## 2018-01-04 MED ORDER — SCOPOLAMINE 1 MG/3DAYS TD PT72
MEDICATED_PATCH | TRANSDERMAL | Status: AC
  Filled 2018-01-04: qty 1

## 2018-01-04 MED ORDER — 0.9 % SODIUM CHLORIDE (POUR BTL) OPTIME
TOPICAL | Status: DC | PRN
  Administered 2018-01-04 (×2): 1000 mL

## 2018-01-04 MED ORDER — METHOCARBAMOL 1000 MG/10ML IJ SOLN
1000.0000 mg | Freq: Three times a day (TID) | INTRAVENOUS | Status: DC
  Administered 2018-01-04 – 2018-01-06 (×6): 1000 mg via INTRAVENOUS
  Filled 2018-01-04 (×7): qty 10

## 2018-01-04 MED ORDER — HYDROMORPHONE HCL 1 MG/ML IJ SOLN
INTRAMUSCULAR | Status: AC
  Filled 2018-01-04: qty 1

## 2018-01-04 MED ORDER — FENTANYL CITRATE (PF) 250 MCG/5ML IJ SOLN
INTRAMUSCULAR | Status: DC | PRN
  Administered 2018-01-04 (×5): 50 ug via INTRAVENOUS
  Administered 2018-01-04: 100 ug via INTRAVENOUS
  Administered 2018-01-04: 50 ug via INTRAVENOUS
  Administered 2018-01-04 (×2): 25 ug via INTRAVENOUS

## 2018-01-04 MED ORDER — FENTANYL CITRATE (PF) 250 MCG/5ML IJ SOLN
INTRAMUSCULAR | Status: AC
  Filled 2018-01-04: qty 5

## 2018-01-04 MED ORDER — ONDANSETRON HCL 4 MG/2ML IJ SOLN
INTRAMUSCULAR | Status: AC
  Filled 2018-01-04: qty 2

## 2018-01-04 MED ORDER — SCOPOLAMINE 1 MG/3DAYS TD PT72
MEDICATED_PATCH | TRANSDERMAL | Status: DC | PRN
  Administered 2018-01-04: 1 via TRANSDERMAL

## 2018-01-04 MED ORDER — HYDROMORPHONE HCL 1 MG/ML IJ SOLN
INTRAMUSCULAR | Status: AC
  Administered 2018-01-04: 0.5 mg via INTRAVENOUS
  Filled 2018-01-04: qty 1

## 2018-01-04 MED ORDER — PROPOFOL 10 MG/ML IV BOLUS
INTRAVENOUS | Status: DC | PRN
  Administered 2018-01-04 (×2): 100 mg via INTRAVENOUS

## 2018-01-04 MED ORDER — TOBRAMYCIN SULFATE 1.2 G IJ SOLR
INTRAMUSCULAR | Status: DC | PRN
  Administered 2018-01-04: 1.2 g

## 2018-01-04 MED ORDER — VANCOMYCIN HCL 1000 MG IV SOLR
INTRAVENOUS | Status: DC | PRN
  Administered 2018-01-04: 1000 mg via INTRAVENOUS

## 2018-01-04 MED ORDER — CEFAZOLIN SODIUM-DEXTROSE 2-3 GM-%(50ML) IV SOLR
INTRAVENOUS | Status: DC | PRN
  Administered 2018-01-04: 2 g via INTRAVENOUS

## 2018-01-04 MED ORDER — ACETAMINOPHEN 10 MG/ML IV SOLN
1000.0000 mg | Freq: Four times a day (QID) | INTRAVENOUS | Status: DC
  Administered 2018-01-04 – 2018-01-05 (×3): 1000 mg via INTRAVENOUS
  Filled 2018-01-04 (×3): qty 100

## 2018-01-04 MED ORDER — DEXAMETHASONE SODIUM PHOSPHATE 10 MG/ML IJ SOLN
INTRAMUSCULAR | Status: AC
  Filled 2018-01-04: qty 1

## 2018-01-04 SURGICAL SUPPLY — 98 items
BANDAGE ACE 4X5 VEL STRL LF (GAUZE/BANDAGES/DRESSINGS) ×1 IMPLANT
BANDAGE ACE 6X5 VEL STRL LF (GAUZE/BANDAGES/DRESSINGS) ×1 IMPLANT
BANDAGE ELASTIC 3 VELCRO ST LF (GAUZE/BANDAGES/DRESSINGS) ×2 IMPLANT
BANDAGE ELASTIC 4 VELCRO ST LF (GAUZE/BANDAGES/DRESSINGS) ×2 IMPLANT
BANDAGE ESMARK 6X9 LF (GAUZE/BANDAGES/DRESSINGS) ×1 IMPLANT
BAR EXFX 150X11 NS LF (EXFIX) ×2
BAR EXFX 350X11 NS LF (EXFIX) ×2
BAR GLASS FIBER EXFX 11X150 (EXFIX) ×4 IMPLANT
BAR GLASS FIBER EXFX 11X350 (EXFIX) ×4 IMPLANT
BIT DRILL 4X70 QC CALB QC (BIT) ×2 IMPLANT
BIT DRILL WIN 2.5 (BIT) ×1 IMPLANT
BIT DRILL WIN 2.5MM (BIT) ×1
BIT DRILL WIN 4.5 (BIT) ×1 IMPLANT
BIT DRILL WIN 4.5MM (BIT) ×1
BLADE SURG 10 STRL SS (BLADE) ×2 IMPLANT
BNDG CMPR 9X6 STRL LF SNTH (GAUZE/BANDAGES/DRESSINGS) ×1
BNDG CMPR MD 5X2 ELC HKLP STRL (GAUZE/BANDAGES/DRESSINGS) ×1
BNDG COHESIVE 6X5 TAN STRL LF (GAUZE/BANDAGES/DRESSINGS) ×1 IMPLANT
BNDG ELASTIC 2 VLCR STRL LF (GAUZE/BANDAGES/DRESSINGS) ×2 IMPLANT
BNDG ESMARK 6X9 LF (GAUZE/BANDAGES/DRESSINGS) ×3
BNDG GAUZE ELAST 4 BULKY (GAUZE/BANDAGES/DRESSINGS) ×6 IMPLANT
BOWL SMART MIX CTS (DISPOSABLE) ×2 IMPLANT
BRUSH SCRUB SURG 4.25 DISP (MISCELLANEOUS) ×4 IMPLANT
BUR EGG ELITE 5.0 (BURR) ×1 IMPLANT
BUR EGG ELITE 5.0MM (BURR) ×1
CEMENT BONE REFOBACIN R1X40 US (Cement) ×2 IMPLANT
CLAMP BAR TO BAR (EXFIX) ×4 IMPLANT
CLAMP BAR TO PIN (EXFIX) ×8 IMPLANT
CLAMP MULTI-PIN 2-BAR 75MM (EXFIX) ×2 IMPLANT
CLOSURE WOUND 1/2 X4 (GAUZE/BANDAGES/DRESSINGS)
CONT SPEC 4OZ CLIKSEAL STRL BL (MISCELLANEOUS) ×4 IMPLANT
COVER SURGICAL LIGHT HANDLE (MISCELLANEOUS) ×4 IMPLANT
CUFF TOURNIQUET SINGLE 18IN (TOURNIQUET CUFF) IMPLANT
CUFF TOURNIQUET SINGLE 24IN (TOURNIQUET CUFF) IMPLANT
CUFF TOURNIQUET SINGLE 34IN LL (TOURNIQUET CUFF) ×2 IMPLANT
DRAPE C-ARM 42X72 X-RAY (DRAPES) ×2 IMPLANT
DRAPE C-ARMOR (DRAPES) ×3 IMPLANT
DRAPE U-SHAPE 47X51 STRL (DRAPES) ×3 IMPLANT
DRSG ADAPTIC 3X8 NADH LF (GAUZE/BANDAGES/DRESSINGS) ×1 IMPLANT
DRSG MEPITEL 4X7.2 (GAUZE/BANDAGES/DRESSINGS) ×2 IMPLANT
ELECT REM PT RETURN 9FT ADLT (ELECTROSURGICAL) ×3
ELECTRODE REM PT RTRN 9FT ADLT (ELECTROSURGICAL) ×1 IMPLANT
EVACUATOR 1/8 PVC DRAIN (DRAIN) IMPLANT
GAUZE SPONGE 4X4 12PLY STRL (GAUZE/BANDAGES/DRESSINGS) ×3 IMPLANT
GAUZE SPONGE 4X4 12PLY STRL LF (GAUZE/BANDAGES/DRESSINGS) ×2 IMPLANT
GLOVE BIO SURGEON STRL SZ7.5 (GLOVE) ×3 IMPLANT
GLOVE BIO SURGEON STRL SZ8 (GLOVE) ×3 IMPLANT
GLOVE BIOGEL PI IND STRL 7.5 (GLOVE) ×1 IMPLANT
GLOVE BIOGEL PI IND STRL 8 (GLOVE) ×1 IMPLANT
GLOVE BIOGEL PI INDICATOR 7.5 (GLOVE) ×2
GLOVE BIOGEL PI INDICATOR 8 (GLOVE) ×2
GOWN STRL REUS W/ TWL LRG LVL3 (GOWN DISPOSABLE) ×2 IMPLANT
GOWN STRL REUS W/ TWL XL LVL3 (GOWN DISPOSABLE) ×1 IMPLANT
GOWN STRL REUS W/TWL LRG LVL3 (GOWN DISPOSABLE) ×6
GOWN STRL REUS W/TWL XL LVL3 (GOWN DISPOSABLE) ×3
HALF PIN 3MM (EXFIX) ×2 IMPLANT
KIT BASIN OR (CUSTOM PROCEDURE TRAY) ×3 IMPLANT
KIT ROOM TURNOVER OR (KITS) ×3 IMPLANT
MANIFOLD NEPTUNE II (INSTRUMENTS) ×1 IMPLANT
NAIL FLEXIBLE WIN 2.0MM (Nail) ×2 IMPLANT
NAIL FLEXIBLE WIN 4.0MM (Nail) ×2 IMPLANT
NEEDLE 22X1 1/2 (OR ONLY) (NEEDLE) IMPLANT
NS IRRIG 1000ML POUR BTL (IV SOLUTION) ×5 IMPLANT
PACK ORTHO EXTREMITY (CUSTOM PROCEDURE TRAY) ×3 IMPLANT
PAD ABD 8X10 STRL (GAUZE/BANDAGES/DRESSINGS) ×4 IMPLANT
PAD ARMBOARD 7.5X6 YLW CONV (MISCELLANEOUS) ×6 IMPLANT
PAD CAST 4YDX4 CTTN HI CHSV (CAST SUPPLIES) IMPLANT
PADDING CAST COTTON 4X4 STRL (CAST SUPPLIES) ×3
PADDING CAST COTTON 6X4 STRL (CAST SUPPLIES) ×3 IMPLANT
PIN 3MM (EXFIX) ×2 IMPLANT
PIN HALF YELLOW 5X160X35 (EXFIX) ×4 IMPLANT
PIN TRANSFIXING 5.0 (EXFIX) ×2 IMPLANT
SPONGE LAP 18X18 X RAY DECT (DISPOSABLE) ×5 IMPLANT
STAPLER VISISTAT 35W (STAPLE) ×2 IMPLANT
STOCKINETTE IMPERVIOUS LG (DRAPES) ×1 IMPLANT
STRIP CLOSURE SKIN 1/2X4 (GAUZE/BANDAGES/DRESSINGS) IMPLANT
SUCTION FRAZIER HANDLE 10FR (MISCELLANEOUS) ×4
SUCTION TUBE FRAZIER 10FR DISP (MISCELLANEOUS) IMPLANT
SUT ETHILON 2 0 FS 18 (SUTURE) ×6 IMPLANT
SUT ETHILON 2 0 PSLX (SUTURE) ×6 IMPLANT
SUT ETHILON 3 0 PS 1 (SUTURE) IMPLANT
SUT ETHILON O TP 1 (SUTURE) ×2 IMPLANT
SUT PDS AB 0 CT 36 (SUTURE) ×2 IMPLANT
SUT PDS AB 2-0 CT1 27 (SUTURE) ×6 IMPLANT
SUT VIC AB 0 CT1 27 (SUTURE)
SUT VIC AB 0 CT1 27XBRD ANBCTR (SUTURE) IMPLANT
SUT VIC AB 2-0 CT1 27 (SUTURE)
SUT VIC AB 2-0 CT1 TAPERPNT 27 (SUTURE) IMPLANT
SWAB COLLECTION DEVICE MRSA (MISCELLANEOUS) ×2 IMPLANT
SWAB CULTURE ESWAB REG 1ML (MISCELLANEOUS) ×2 IMPLANT
SYR CONTROL 10ML LL (SYRINGE) IMPLANT
TOWEL OR 17X24 6PK STRL BLUE (TOWEL DISPOSABLE) ×2 IMPLANT
TOWEL OR 17X26 10 PK STRL BLUE (TOWEL DISPOSABLE) ×4 IMPLANT
TUBE CONNECTING 12'X1/4 (SUCTIONS) ×1
TUBE CONNECTING 12X1/4 (SUCTIONS) ×2 IMPLANT
UNDERPAD 30X30 (UNDERPADS AND DIAPERS) ×3 IMPLANT
WATER STERILE IRR 1000ML POUR (IV SOLUTION) ×2 IMPLANT
YANKAUER SUCT BULB TIP NO VENT (SUCTIONS) ×3 IMPLANT

## 2018-01-04 NOTE — Anesthesia Procedure Notes (Signed)
Procedure Name: Intubation Performed by: Teressa Lower., CRNA Pre-anesthesia Checklist: Patient identified, Emergency Drugs available, Suction available and Patient being monitored Patient Re-evaluated:Patient Re-evaluated prior to induction Oxygen Delivery Method: Circle system utilized Preoxygenation: Pre-oxygenation with 100% oxygen Induction Type: IV induction Ventilation: Mask ventilation without difficulty Laryngoscope Size: Mac and 3 Grade View: Grade I Tube type: Oral Tube size: 7.0 mm Number of attempts: 1 Airway Equipment and Method: Stylet and Oral airway Placement Confirmation: ETT inserted through vocal cords under direct vision,  positive ETCO2 and breath sounds checked- equal and bilateral Secured at: 21 cm Tube secured with: Tape Dental Injury: Teeth and Oropharynx as per pre-operative assessment

## 2018-01-04 NOTE — Anesthesia Postprocedure Evaluation (Signed)
Anesthesia Post Note  Patient: [name] Carr  Procedure(s) Performed: REMOVAL HARDWARE LEFT ANKLE WITH DEBRIDEMENT AND EXTERNAL FIXATION (Left ) EXTERNAL FIXATION ANKLE (Left )     Patient location during evaluation: PACU Anesthesia Type: General Level of consciousness: awake and alert Pain management: pain level controlled Vital Signs Assessment: post-procedure vital signs reviewed and stable Respiratory status: spontaneous breathing, nonlabored ventilation, respiratory function stable and patient connected to nasal cannula oxygen Cardiovascular status: blood pressure returned to baseline and stable Postop Assessment: no apparent nausea or vomiting Anesthetic complications: no    Last Vitals:  Vitals:   01/04/18 1347 01/04/18 1356  BP: 123/79 118/80  Pulse: (!) 101 97  Resp: 12 12  Temp:  36.7 C  SpO2: 92% 93%    Last Pain:  Vitals:   01/04/18 1520  TempSrc:   PainSc: 8                  Alexyss Balzarini P Richell Corker

## 2018-01-04 NOTE — Transfer of Care (Signed)
Immediate Anesthesia Transfer of Care Note  Patient: [name] Carr  Procedure(s) Performed: REMOVAL HARDWARE LEFT ANKLE WITH DEBRIDEMENT AND EXTERNAL FIXATION (Left ) EXTERNAL FIXATION ANKLE (Left )  Patient Location: PACU  Anesthesia Type:General  Level of Consciousness: awake, alert  and oriented  Airway & Oxygen Therapy: Patient Spontanous Breathing  Post-op Assessment: Report given to RN and Post -op Vital signs reviewed and stable  Post vital signs: Reviewed and stable  Last Vitals:  Vitals:   01/04/18 0446 01/04/18 0548  BP: 122/70 119/60  Pulse: 75 78  Resp: 16 18  Temp: 36.7 C (!) 36 C  SpO2: 99% 99%    Last Pain:  Vitals:   01/04/18 0608  TempSrc:   PainSc: 0-No pain      Patients Stated Pain Goal: 2 (94/70/96 2836)  Complications: No apparent anesthesia complications

## 2018-01-04 NOTE — Brief Op Note (Signed)
12/31/2017 - 01/04/2018  12:01 PM  PATIENT:  [name] Carr  47 y.o. female  PRE-OPERATIVE DIAGNOSIS:   1. LEFT TIBIA NONUNION, POSSIBLE OSTEOMYELITIS, S/P OPEN FRACTURE 2. LEFT FIBULA NONUNION, POSSIBLE OSTEOMYELITIS, S/P OPEN FRACTURE 3. BROKEN HARDWARE LEFT TIBIA 4. LOOSE HARDWARE LEFT FIBULA  POST-OPERATIVE DIAGNOSIS:   1. LEFT TIBIA NONUNION 2. LEFT FIBULA NONUNION 3. BROKEN HARDWARE LEFT TIBIA 4. LOOSE HARDWARE LEFT FIBULA  PROCEDURE:  Procedure(s): 1. PARTIAL EXCISION NECROTIC BONE LEFT TIBIA 2. PARTIAL EXCISION NECROTIC BONE LEFT FIBULA 3. REMOVAL HARDWARE LEFT TIBIA 4. REMOVAL OF HARDWARE LEFT FIBULA 5. IM FIXATION LEFT TIBIA 6. IM FIXATION LEFT FIBULA 7. PLACEMENT OF ANTIBIOTIC CEMENT SPACER LEFT TIBIA AND FIBULA 8. SPANNING EXTERNAL FIXATION LEFT ANKLE (Left) 9.   SURGEON:  Surgeon(s) and Role:    * Altamese [place], MD - Primary  PHYSICIAN ASSISTANT: Ainsley Spinner, PA-C  ANESTHESIA:   general  EBL:  100 mL   BLOOD ADMINISTERED:none  DRAINS: none   LOCAL MEDICATIONS USED:  NONE  SPECIMEN:  Source of Specimen:  multiple; necrotic bone and graft left tibia, necrotic bone left fibula  DISPOSITION OF SPECIMEN:  micro  COUNTS:  YES  TOURNIQUET:  No  DICTATION: .Other Dictation: Dictation Number [id number]  PLAN OF CARE: Admit to inpatient   PATIENT DISPOSITION:  PACU - hemodynamically stable.   Delay start of Pharmacological VTE agent (>24hrs) due to surgical blood loss or risk of bleeding: no

## 2018-01-05 ENCOUNTER — Encounter (HOSPITAL_COMMUNITY): Payer: Self-pay | Admitting: Orthopedic Surgery

## 2018-01-05 LAB — COMPREHENSIVE METABOLIC PANEL
ALT: 20 U/L (ref 14–54)
ANION GAP: 9 (ref 5–15)
AST: 17 U/L (ref 15–41)
Albumin: 3.2 g/dL — ABNORMAL LOW (ref 3.5–5.0)
Alkaline Phosphatase: 81 U/L (ref 38–126)
BUN: 6 mg/dL (ref 6–20)
CHLORIDE: 104 mmol/L (ref 101–111)
CO2: 24 mmol/L (ref 22–32)
Calcium: 8.6 mg/dL — ABNORMAL LOW (ref 8.9–10.3)
Creatinine, Ser: 0.8 mg/dL (ref 0.44–1.00)
GFR calc Af Amer: >60 mL/min (ref >60)
GFR calc non Af Amer: >60 mL/min (ref >60)
Glucose, Bld: 111 mg/dL — ABNORMAL HIGH (ref 65–99)
Potassium: 3.7 mmol/L (ref 3.5–5.1)
SODIUM: 137 mmol/L (ref 135–145)
Total Bilirubin: 0.8 mg/dL (ref 0.3–1.2)
Total Protein: 5.9 g/dL — ABNORMAL LOW (ref 6.5–8.1)

## 2018-01-05 LAB — CBC
HCT: 30.9 % — ABNORMAL LOW (ref 36.0–46.0)
Hemoglobin: 9.6 g/dL — ABNORMAL LOW (ref 12.0–15.0)
MCH: 26.6 pg (ref 26.0–34.0)
MCHC: 31.1 g/dL (ref 30.0–36.0)
MCV: 85.6 fL (ref 78.0–100.0)
PLATELETS: 358 10*3/uL (ref 150–400)
RBC: 3.61 MIL/uL — AB (ref 3.87–5.11)
RDW: 17.2 % — ABNORMAL HIGH (ref 11.5–15.5)
WBC: 13.4 10*3/uL — ABNORMAL HIGH (ref 4.0–10.5)

## 2018-01-05 LAB — ESTRADIOL: ESTRADIOL: 165.4 pg/mL

## 2018-01-05 LAB — LUTEINIZING HORMONE: LH: 15.9 m[IU]/mL

## 2018-01-05 LAB — FOLLICLE STIMULATING HORMONE: FSH: 9.2 m[IU]/mL

## 2018-01-05 MED ORDER — OXYCODONE-ACETAMINOPHEN 7.5-325 MG PO TABS
1.0000 | ORAL_TABLET | Freq: Four times a day (QID) | ORAL | Status: DC | PRN
  Administered 2018-01-05 – 2018-01-07 (×3): 2 via ORAL
  Filled 2018-01-05 (×3): qty 2

## 2018-01-05 MED ORDER — OXYCODONE HCL 5 MG PO TABS
5.0000 mg | ORAL_TABLET | ORAL | Status: DC | PRN
  Administered 2018-01-05 – 2018-01-07 (×7): 10 mg via ORAL
  Filled 2018-01-05 (×7): qty 2

## 2018-01-05 MED ORDER — SODIUM CHLORIDE 0.9% FLUSH
3.0000 mL | INTRAVENOUS | Status: DC | PRN
  Administered 2018-01-05: 3 mL via INTRAVENOUS
  Filled 2018-01-05: qty 3

## 2018-01-05 MED ORDER — SODIUM CHLORIDE 0.9% FLUSH
3.0000 mL | Freq: Two times a day (BID) | INTRAVENOUS | Status: DC
  Administered 2018-01-05 – 2018-01-07 (×5): 3 mL via INTRAVENOUS

## 2018-01-05 MED ORDER — ENOXAPARIN SODIUM 40 MG/0.4ML ~~LOC~~ SOLN
40.0000 mg | SUBCUTANEOUS | Status: DC
  Administered 2018-01-05 – 2018-01-07 (×3): 40 mg via SUBCUTANEOUS
  Filled 2018-01-05 (×3): qty 0.4

## 2018-01-05 MED ORDER — OXYCODONE HCL 5 MG PO TABS: 5.0000 mg | ORAL_TABLET | ORAL | Status: DC | PRN

## 2018-01-05 MED ORDER — ACETAMINOPHEN 500 MG PO TABS
1000.0000 mg | ORAL_TABLET | Freq: Four times a day (QID) | ORAL | Status: AC
  Administered 2018-01-05: 1000 mg via ORAL
  Filled 2018-01-05: qty 2

## 2018-01-05 NOTE — Progress Notes (Signed)
Orthopaedic Trauma Service (OTS)  1 Day Post-Op Procedure(s) (LRB): REMOVAL HARDWARE LEFT ANKLE WITH DEBRIDEMENT AND EXTERNAL FIXATION (Left) EXTERNAL FIXATION ANKLE (Left)  Subjective: Patient reports pain as moderate.  Plans to shampoo hair today and mobilize with PT.  Objective: Current Vitals Blood pressure 108/60, pulse 75, temperature 98.2 F (36.8 C), temperature source Oral, resp. rate 16, height 5\' 5"  (1.651 m), weight 84.7 kg (186 lb 11.7 oz), last menstrual period 12/10/2017, SpO2 98 %. Vital signs in last 24 hours: Temp:  [97.9 F (36.6 C)-98.2 F (36.8 C)] 98.2 F (36.8 C) (03/13 0436) Pulse Rate:  [75-101] 75 (03/13 0436) Resp:  [11-19] 16 (03/13 0436) BP: (108-128)/(60-86) 108/60 (03/13 0436) SpO2:  [88 %-100 %] 98 % (03/13 0436)  Intake/Output from previous day: 03/12 0701 - 03/13 0700 In: 1885 [P.O.:120; I.V.:1530; IV Piggyback:160] Out: 230 [Urine:200; Blood:30]  LABS Recent Labs    01/04/18 0429 01/05/18 0538  HGB 11.2* 9.6*   Recent Labs    01/04/18 0429 01/05/18 0538  WBC 8.0 13.4*  RBC 4.15 3.61*  HCT 35.3* 30.9*  PLT 360 358   Recent Labs    01/03/18 1025 01/05/18 0538  NA 136 137  K 3.7 3.7  CL 103 104  CO2 23 24  BUN 10 6  CREATININE 0.69 0.80  GLUCOSE 102* 111*  CALCIUM 9.1  9.6 8.6*   Recent Labs    01/04/18 0429  INR 1.07     Physical Exam LLE Dressing intact, clean, dry except small bleeding lateral heel  Edema/ swelling controlled  Sens: SPN, TN intact; slight decrease in DPN which is baseline for her  Motor: EHL, FHL, and lessor toe ext and flex all intact  Brisk cap refill, warm to touch  Assessment/Plan: 1 Day Post-Op Procedure(s) (LRB): REMOVAL HARDWARE LEFT ANKLE WITH DEBRIDEMENT AND EXTERNAL FIXATION (Left) EXTERNAL FIXATION ANKLE (Left) 1. PT/OT NWB LLE 2. DVT proph Lovenox   Altamese [place], MD [facility] [phone] (p)

## 2018-01-06 ENCOUNTER — Encounter (HOSPITAL_COMMUNITY): Payer: Self-pay | Admitting: Orthopedic Surgery

## 2018-01-06 DIAGNOSIS — F419 Anxiety disorder, unspecified: Secondary | ICD-10-CM | POA: Diagnosis present

## 2018-01-06 DIAGNOSIS — J45909 Unspecified asthma, uncomplicated: Secondary | ICD-10-CM | POA: Diagnosis present

## 2018-01-06 DIAGNOSIS — R7303 Prediabetes: Secondary | ICD-10-CM | POA: Diagnosis present

## 2018-01-06 LAB — UPEP/UIFE/LIGHT CHAINS/TP, 24-HR UR
% BETA, URINE: 0 %
ALPHA 1 URINE: 0 %
ALPHA 2 UR: 0 %
Albumin, U: 0 %
FREE KAPPA LT CHAINS, UR: 2.45 mg/L (ref 1.35–24.19)
FREE KAPPA/LAMBDA RATIO: 14.41 — AB (ref 2.04–10.37)
FREE LAMBDA LT CHAINS, UR: 0.17 mg/L — AB (ref 0.24–6.66)
GAMMA GLOBULIN URINE: 0 %
Total Protein, Urine-Ur/day: 120 mg/24 hr (ref 30–150)
Total Protein, Urine: 4.8 mg/dL
Total Volume: 2500

## 2018-01-06 LAB — NICOTINE/COTININE METABOLITES
Cotinine: NOT DETECTED ng/mL
NICOTINE: NOT DETECTED ng/mL

## 2018-01-06 MED ORDER — VITAMIN C 500 MG PO TABS
500.0000 mg | ORAL_TABLET | Freq: Every day | ORAL | Status: DC
  Administered 2018-01-06 – 2018-01-07 (×2): 500 mg via ORAL
  Filled 2018-01-06 (×2): qty 1

## 2018-01-06 MED ORDER — VITAMIN D 1000 UNITS PO TABS
2000.0000 [IU] | ORAL_TABLET | Freq: Two times a day (BID) | ORAL | Status: DC
  Administered 2018-01-06 – 2018-01-07 (×3): 2000 [IU] via ORAL
  Filled 2018-01-06 (×3): qty 2

## 2018-01-06 MED ORDER — CYCLOBENZAPRINE HCL 10 MG PO TABS
5.0000 mg | ORAL_TABLET | Freq: Three times a day (TID) | ORAL | Status: DC | PRN
  Administered 2018-01-06 – 2018-01-07 (×3): 10 mg via ORAL
  Filled 2018-01-06 (×3): qty 1

## 2018-01-06 MED ORDER — CALCIUM CITRATE 950 (200 CA) MG PO TABS
200.0000 mg | ORAL_TABLET | Freq: Two times a day (BID) | ORAL | Status: DC
Start: 2018-01-06 — End: 2018-01-07
  Administered 2018-01-06 – 2018-01-07 (×3): 200 mg via ORAL
  Filled 2018-01-06 (×3): qty 1

## 2018-01-06 NOTE — Plan of Care (Signed)
  Activity: Risk for activity intolerance will decrease 01/06/2018 1101 - Progressing by Williams Che, RN   Nutrition: Adequate nutrition will be maintained 01/06/2018 1101 - Progressing by Williams Che, RN   Coping: Level of anxiety will decrease 01/06/2018 1101 - Progressing by Williams Che, RN   Elimination: Will not experience complications related to bowel motility 01/06/2018 1101 - Progressing by Williams Che, RN   Pain Managment: General experience of comfort will improve 01/06/2018 1101 - Progressing by Williams Che, RN   Safety: Ability to remain free from injury will improve 01/06/2018 1101 - Progressing by Williams Che, RN

## 2018-01-06 NOTE — Progress Notes (Signed)
Orthopedic Trauma Service Progress Note   Patient ID: [name] Carr MRN: [id number] DOB/AGE: 1971/04/22 47 y.o.  Subjective:  Doing well  Worked well with therapy yesterday  No new issues  Trying to take only oral meds for pain control  Able to transfer to bathroom with walker relatively easily    Review of Systems  Constitutional: Negative for chills and fever.  Eyes: Negative for blurred vision and double vision.  Respiratory: Negative for shortness of breath.   Cardiovascular: Negative for chest pain and palpitations.  Gastrointestinal: Negative for abdominal pain, nausea and vomiting.  Neurological: Negative for tingling and tremors.    Objective:   VITALS:   Vitals:   01/05/18 0436 01/05/18 1100 01/05/18 2036 01/06/18 0422  BP: 108/60 (!) 118/59 116/60 126/62  Pulse: 75 67 73 79  Resp: 16 16 16 16   Temp: 98.2 F (36.8 C) 98 F (36.7 C) 98.6 F (37 C) 99 F (37.2 C)  TempSrc: Oral Oral Oral Oral  SpO2: 98% 98% 98% 96%  Weight:      Height:        Estimated body mass index is 31.07 kg/m as calculated from the following:   Height as of this encounter: 5\' 5"  (1.651 m).   Weight as of this encounter: 84.7 kg (186 lb 11.7 oz).   Intake/Output      03/13 0701 - 03/14 0700 03/14 0701 - 03/15 0700   P.O. 1080    I.V. (mL/kg)     Other     IV Piggyback     Total Intake(mL/kg) 1080 (12.8)    Urine (mL/kg/hr)     Blood     Total Output     Net +1080         Urine Occurrence 3 x      Labs   Nicotine/cotonine levels are non-detectable  LH/FSH and estradiol levels are appropriate   PHYSICAL EXAM:   Gen: awake and alert, NAD, appears well Lungs: CTA B  Cardiac: RRR Abd: + BS, NTND  Ext:       Left Lower Extremity   Ex fix stable  Dressing c/d/i  Ext warm   Swelling stable  DPN, SPN, TN sensation intact  EHL, FHL, lesser toe motor intact  No pain with passive stretching   Assessment/Plan: 2 Days Post-Op    Principal Problem:   Displaced pilon fracture of left tibia, subsequent encounter for open fracture type IIIA, IIIB, or IIIC with nonunion Active Problems:   Vitamin D deficiency   Anti-infectives (From admission, onward)   Start     Dose/Rate Route Frequency Ordered Stop   01/04/18 1127  vancomycin (VANCOCIN) powder  Status:  Discontinued       As needed 01/04/18 1127 01/04/18 1257   01/04/18 1127  tobramycin (NEBCIN) powder  Status:  Discontinued       As needed 01/04/18 1128 01/04/18 1257    .  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 s/p I&D, ROH, ex fix placement and placement of Abx spacer  Pt doing well  NWB L leg  Toe and knee motion as tolerated  Ice and elevate above heart for swelling and pain contol  Return to OR in 6 weeks or so for removal of ex fix and placement of IMN     Lab workup essentially negative  Some vitamin  D deficiency, supplementation increased                              - Pain management:             Titrate accordingly    PO meds  - ABL anemia/Hemodynamics             Stable   - Medical issues              Home meds   - DVT/PE prophylaxis:             Lovenox x 3 weeks  - ID:              periop abx completed    - Metabolic Bone Disease:             Metabolic bone diease work up ongoing             Increases vitamin d to 4000-5000 IUs of vitamin D3 daily    - Activity:             NWB L leg             OOB ad lib otherwise   - FEN/GI prophylaxis/Foley/Lines:             Reg diet             - Impediments to fracture healing:             As above    - Dispo:             dc home tomorrow    Jari Pigg, PA-C [facility] [phone] (P) [phone] Levi Aland (C) 01/06/2018, 11:12 AM

## 2018-01-06 NOTE — Op Note (Signed)
[name] Carr, [name] NO.:  [id number]  MEDICAL RECORD NO.:  [id number]  LOCATION:  A07C                         FACILITY:  [facility]  PHYSICIAN:  Astrid Divine. Marcelino Scot, M.D. DATE OF BIRTH:  07-07-1971  DATE OF PROCEDURE:  01/04/2018 DATE OF DISCHARGE:                              OPERATIVE REPORT   PREOPERATIVE DIAGNOSES: 1. Left tibia nonunion, possible osteomyelitis, status post grade 3A     open fracture. 2. 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 3. Broken hardware, left tibia. 4. Loose hardware, left fibula.  POSTOPERATIVE DIAGNOSES: 1. Left tibia nonunion with necrotic bone. 2. Left fibula nonunion with necrotic bone. 3. Broken hardware, left tibia. 4. Loose hardware, left fibula.  PROCEDURES: 1. Partial excision of necrotic bone, left tibia. 2. Partial excision, necrotic bone, left fibula. 3. Removal of hardware, left tibia. 4. Removal of hardware, left fibula. 5. Intramedullary fixation of the left tibia. 6. Intramedullary fixation of the left fibula. 7. Placement of antibiotic cement spacer, left tibia and fibula. 8. Application of spanning external fixation, left ankle. 9. Retention suture closure of lateral 12-cm wound.  SURGEON:  Astrid Divine. Marcelino Scot, MD.  ASSISTANT:  Ainsley Spinner, PA-C.  ANESTHESIA:  General.  ESTIMATED BLOOD LOSS:  100 mL.  DISPOSITION:  To PACU.  CONDITION:  Stable.  SPECIMENS:  Multiple including necrotic bone and unincorporated graft from the left tibia and necrotic bone from the left fibula.  All specimen sent to Micro.  BRIEF SUMMARY OF INDICATION FOR PROCEDURE:  [name] Carr is a very pleasant 47 year old female who initially sustained a comminuted open fracture of her tibia and fibula 14 months ago.  She underwent debridement and spanning external fixation initially followed by ORIF. This went on to failure and nonunion requiring a subsequent revision surgery with  extensive grafting including iliac crest and reamed intramedullary aspiration.  The patient began to experience increasing pain and then severe uncontrolled pain with fracture of both of her plates.  Consequently, she was admitted by Dr. Zollie Carr, the initial operating surgeon.  He saw and evaluated her and felt that revision surgery at this point was outside of his scope of practice and asserted that these injuries and complications would be best managed by fellowship trained orthopedic traumatologist.  Consequently, I was consulted to assume further management of this patient, which I was happy to do.  I did discuss with her the risks and benefits of surgery including the possibility of a cold infection leading to this several- time failure.  As an alternative, we discussed necrotic bone, which would require excision in order to have biologically active bone capable of healing.  Other risks that we discussed included infection, nerve injury, vessel injury, failure to obtain union, and loss of motion among others.  After full discussion of these risks and others, she strongly wished to proceed.  BRIEF SUMMARY OF PROCEDURE:  The patient was taken to the operating room where general anesthesia was induced.  C-arm was brought in and the incisions were matched with the implant placement.  There were multiple incisions including a long curvilinear incision that went from anterior to medial, a medial  malleolar incision, a separate medial incision just above about one at the metadiaphysis which was somewhat diagonal and then an anterolateral incision and finally a lateral incision directly over the plate.  The most distal medial incision was remade and the small caliber screws withdrawn without complication from the Zimmer plate that incision was used for hardware removal only.  I then made 50% of the anteromedial incision carrying that from the medial aspect of the plate curving  anteriorly toward the extensors and nonunion site.  I used the proximal extent of the incision to remove the hardware and mobilize the plate and then remove it and the more distal part of the incision enabled me to go underneath the extensors and elevate these and enter the nonunion site.  Here, I did not encounter any purulence, but a large quantity of completely unincorporated graft.  I scooped this out and sent some of it for culture.  As I continued deeper into the wound, I discovered frank white necrotic bone.  A partial excision was then performed of the tibia, removing all these portions of bone.  An encap had formed over the proximal tibia such that there was no access to the intramedullary canal.  In similar fashion, this was discovered on the distal fragment, but to a somewhat lesser degree.  Consequently, in order to reestablish appropriate intramedullary alignment to prepare for probable later intramedullary fixation, an intramedullary device was prepared for placement.  Prior to doing so, however, this wound was packed and attention was turned to the fibula after a very thorough and copious irrigation.  The fibular incision was remade.  Dissection carried down to the plate retracting soft tissues anteriorly.  The proximal hardware remained well fixed, however, the distal hardware was completely pulled out of the bone anterior to the fibula.  I was able to extract these screws both proximally and distally, mobilized and removed the plate.  Underneath was ere desiccated segments of comminuted fracture and a similar pattern of cortication over the ends of the fibula.  This material was also sent for culture as it was involved in the initial open fracture to evaluate for osteomyelitis.  Copious irrigation was performed here as well.  Ainsley Spinner, PA-C, assisted me throughout with all of these portions, which were very difficult to deliver the bone ends, perform an adequate  debridement and partial excision and to remove the implants.  Following this, we proceeded with intramedullary stabilization of both bones.  A 4.5 drill was used on the tibia to re-establish the canal proximally and distally checking with C-arm to make sure I was in the center of the bone.  A 4.5 Biomet rod was then cut and placed within this.  This was followed by use of a 2.5 mm titanium rod, placed retrograde through the fibula and then through the intramedullary canal and then advanced distally through the fibula, also through the intramedullary canal.  My assistant held proper rotation and distraction throughout.  After placement, there was insufficient stabilization, but dramatically improved translation, alignment, and length.  Consequently, additional stabilization was required.  At this point, we then applied a spanning external fixator to the ankle with 2 tibial pins, a transcalcaneal pin, and 2 metatarsal pins to hold the foot in a plantigrade position.  The intramedullary stabilization of both bones helped to assist with translation, which was excellent and the fixator gave [place] improved rigidity and length.  Following this, I then fashioned an antibiotic spacer with vancomycin and tobramycin that was  placed directly within the tibia cavity as well as around the fibula.  I did overlap the cement spacer with the ends of the bone to facilitate subsequent incorporation at the time of removal and grafting.  Following this, wound was irrigated thoroughly, closed in standard layered fashion using 0 PDS, 2-0 PDS, and 2-0 nylon.  On the lateral side, retention suture closure was required given the multiple previous procedures and inelasticity of the tissues.  That wound was approximately 12 cm. Again, Ainsley Spinner, PA-C, assisted me throughout.  PROGNOSIS:  Ms. Biegler will be nonweightbearing with unrestricted range of motion of the toes and knee.  She will ice, elevate and we  will follow up the cultures to make sure that no slow-growing bacteria such as Staph epi develops that may be contributing to her persistent nonunions.  At this time, it appears to be some unfavorable biological environment.  We were hopeful with the intramedullary stabilization, avoidance of hardware, and cement spacer that we can develop an improved biological environment into which reamed intramedullary aspiration graft can be placed in the future with a positive result.  She will be on broad-spectrum antibiotics pending the cultures and will be on formal DVT prophylaxis with Lovenox.     Astrid Divine. Marcelino Scot, M.D.     MHH/MEDQ  D:  01/05/2018  T:  01/06/2018  Job:  [id number]

## 2018-01-07 LAB — TESTOSTERONE, % FREE: TESTOSTERONE-% FREE: 0.8 % — AB (ref 0.2–0.7)

## 2018-01-07 MED ORDER — OXYCODONE HCL 5 MG PO TABS: 5.0000 mg | ORAL_TABLET | Freq: Four times a day (QID) | ORAL | 0 refills | Status: DC | PRN

## 2018-01-07 MED ORDER — ENOXAPARIN (LOVENOX) PATIENT EDUCATION KIT
PACK | Freq: Once | Status: AC
  Administered 2018-01-07: 12:00:00
  Filled 2018-01-07: qty 1

## 2018-01-07 MED ORDER — VITAMIN D3 125 MCG (5000 UT) PO TABS: 5000.0000 [IU] | ORAL_TABLET | Freq: Two times a day (BID) | ORAL | 2 refills | Status: DC

## 2018-01-07 MED ORDER — ENOXAPARIN (LOVENOX) PATIENT EDUCATION KIT: 1.0000 | PACK | Freq: Once | 0 refills | Status: AC

## 2018-01-07 MED ORDER — ASCORBIC ACID 500 MG PO TABS: 500.0000 mg | ORAL_TABLET | Freq: Every day | ORAL | 2 refills | Status: AC

## 2018-01-07 MED ORDER — OXYCODONE-ACETAMINOPHEN 7.5-325 MG PO TABS: 1.0000 | ORAL_TABLET | Freq: Four times a day (QID) | ORAL | 0 refills | Status: DC | PRN

## 2018-01-07 MED ORDER — CALCIUM CITRATE 950 (200 CA) MG PO TABS: 200.0000 mg | ORAL_TABLET | Freq: Two times a day (BID) | ORAL | 2 refills | Status: DC

## 2018-01-07 MED ORDER — ENOXAPARIN SODIUM 40 MG/0.4ML ~~LOC~~ SOLN: 40.0000 mg | SUBCUTANEOUS | 0 refills | Status: DC

## 2018-01-07 NOTE — Discharge Summary (Signed)
[facility]thopaedic Trauma [facility] (OTS)  Patient ID: [name] Carr MRN: [id number] DOB/AGE: 01/04/1971 47 y.o.  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 Anxiety   . Asthma   . Complication of anesthesia    one time had difficulty waking up  . DVT (deep venous thrombosis) ([place])   . IUD (intrauterine device) in place    Paraguard IUD inserted 2008  . Pneumonia    walking pneumonia  . PONV (postoperative nausea and vomiting)    'MANY MANY MANY years ago"  . Pre-diabetes    cnotrolled with diet an exercise     Procedures Performed: 01/04/2018-Dr. Handy  1. Partial excision of necrotic bone, left tibia. 2. Partial excision, necrotic bone, left fibula. 3. Removal of hardware, left tibia. 4. Removal of hardware, left fibula. 5. Intramedullary fixation of the left tibia. 6. Intramedullary fixation of the left fibula. 7. Placement of antibiotic cement spacer, left tibia and fibula. 8. Application of spanning external fixation, left ankle. 9. Retention suture closure of lateral 12-cm wound.   Discharged Condition: good  Hospital Course:    Patient is a 47 year old white female who has a history of an open left distal tibia and fibula fracture involving the tibial plafond.  This was treated with numerous procedures over the last year including exchange plating and grafting including reamed intramedullary aspirate at her most recent revision surgery.  She was admitted on 12/31/2017 after her most recent plate broke.  Due to the  complexity of the injury the orthopedic trauma service was consulted for further management.  Please see consult note for full summary.  Patient was seen and evaluated by the orthopedic trauma service.  She was taken to the operating room on 01/04/2018 with the procedure above was performed.  This included removal of broken hardware extensive debridement and excision of necrotic bone of her left tibia and fibula.  Placement of antibiotic spacer as well as intramedullary fixation of her tibia and fibula.  Additionally a external fixator was placed to provide additional stability.  After surgery patient was transferred back to the orthopedic floor for observation, pain control and therapy.  Patient did very well over the next several days no perioperative issues were noted for the patient as well.  She was a stable for discharge on postoperative day #3 which is 01/07/2018.   Due to her chronic nonunion and extensive metabolic bone workup was conducted.  Fortunately no significant issues were noted.  we did work her up for PCOS as well as premature menopause but all of her lab were negative.  Thyroid and parathyroid hormone levels were appropriate.  Hemoglobin A1c was within normal range.  Only significant finding was her vitamin D.  Her 25-hydroxy vitamin D was low at 16.8 ng/mL. Her supplementation was increased during her hospital stay to 5000 IUs of vitamin D3 daily.  We will recheck her labs in about 12 weeks.  Calcitriol levels are appropriate.  Drug screen was negative as well as her nicotine test.  Protein electrophoresis studies also appeared to be unremarkable.  Patient will ultimately return to the OR in about 6 weeks for removal of antibiotic spacer removal of her external fixator and hopeful intramedullary nailing with grafting of her nonunion site.  Patient discharged in stable condition on 01/07/2018  Consults: None  Significant Diagnostic Studies: labs:   See labs in chart.  All labs are  essentially within normal range  Vit D, 25-Hydroxy 30.0 - 100.0 ng/mL 16.8 Abnormally low     Intraoperative cultures are negative  Treatments: IV hydration, antibiotics: Ancef, analgesia: Dilaudid, Percocet, OxyIR, anticoagulation: LMW heparin, therapies: PT, OT and RN and surgery: As above  Discharge Exam:     Orthopedic Trauma Service Progress Note    Patient ID: [name] Carr MRN: [id number] DOB/AGE: 1971/10/08 47 y.o.   Subjective:   Doing well No complaints No headaches, no lightheadedness, no epistaxis   Ready to go home The pain is tolerable.  Well controlled with oxycodone No new issues   Blood pressure of 220/67 appears to be an error, a typo.  Patient distinctly remembers it being 120/67.  We did recheck her blood pressure in the room today when dressing changes being performed and her BP was 113/65 with a heart rate of 82   Review of Systems  Constitutional: Negative for chills and fever.  Respiratory: Negative for shortness of breath and wheezing.   Cardiovascular: Negative for chest pain and palpitations.  Gastrointestinal: Negative for abdominal pain, nausea and vomiting.  Neurological: Negative for tingling, tremors and sensory change.      Objective:    VITALS:         Vitals:    01/06/18 0422 01/06/18 2000 01/07/18 0445 01/07/18 1004  BP: 126/62 119/64 (!) 220/67 113/65  Pulse: 79 80 72    Resp: 16 16 17     Temp: 99 F (37.2 C) 98.6 F (37 C) 98.4 F (36.9 C)    TempSrc: Oral Oral Oral    SpO2: 96% 99% 97%    Weight:          Height:              Estimated body mass index is 31.07 kg/m as calculated from the following:   Height as of this encounter: 5' 5"  (1.651 m).   Weight as of this encounter: 84.7 kg (186 lb 11.7 oz).     Intake/Output      03/14 0701 - 03/15 0700 03/15 0701 - 03/16 0700   P.O. 2050    Total Intake(mL/kg) 2050 (24.2)    Net +2050         Urine Occurrence 7 x     PHYSICAL EXAM:    Gen: awake and alert, NAD,  appears well Lungs: CTA B  Cardiac: RRR Abd: + BS, NTND  Ext:       Left Lower Extremity              Ex fix stable             Dressing c/d/i             Dressings removed.  All wounds look fantastic.  Scant bloody drainage.  No erythema no signs of infection.  Swelling is much improved.             Ext warm              DPN, SPN, TN sensation intact             EHL, FHL, lesser toe motor intact  No pain with passive stretching    Assessment/Plan:  3 Days Post-Op    Principal Problem:   Displaced pilon fracture of left tibia, subsequent encounter for open fracture type IIIA, IIIB, or IIIC with nonunion Active Problems:   Vitamin D deficiency   Anxiety   Asthma   Pre-diabetes               Anti-infectives (From admission, onward)    Start     Dose/Rate Route Frequency Ordered Stop    01/04/18 1127   vancomycin (VANCOCIN) powder  Status:  Discontinued         As needed 01/04/18 1127 01/04/18 1257    01/04/18 1127   tobramycin (NEBCIN) powder  Status:  Discontinued         As needed 01/04/18 1128 01/04/18 1257     .   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  Reviewed pin and wound care with patient.  Also instructed her to review her discharge instructions as well.  If it is okay  for her to shower and clean leg and ex-fix with soap and water.               Lab workup essentially negative             Some vitamin D deficiency, supplementation increased        - Pain management:                        Discharge pain medication regimen will include Percocet, OxyIR for breakthrough pain and Flexeril for muscle spasms   - ABL anemia/Hemodynamics             Stable   - Medical issues              Home meds   - DVT/PE prophylaxis:             Lovenox x 3 weeks   - ID:              periop abx completed    - Metabolic Bone Disease:             Metabolic bone diease  work up ongoing             vitamin d to 4000-5000 IUs of vitamin D3 daily    - Activity:             NWB L leg             OOB ad lib otherwise   - FEN/GI prophylaxis/Foley/Lines:             Reg diet     - Impediments to fracture healing:             As above    - Dispo:             dc home today             Follow-up with orthopedics in 10-14 days       Disposition: 01-Home or Self Care  Discharge Instructions    Call MD / Call 911   Complete by:  As directed    If you experience chest pain or shortness of breath, CALL 911 and be transported to the hospital emergency room.  If you develope a fever above 101 F, pus (white drainage) or increased drainage or redness at the wound, or calf pain, call your surgeon's office.   Constipation Prevention   Complete by:  As directed    Drink plenty of fluids.  Prune juice may be helpful.  You may use a stool softener, such as Colace (over the counter) 100 mg twice a day.  Use MiraLax (over the counter) for constipation as needed.   Diet general   Complete by:  As directed    Discharge instructions   Complete by:  As directed    Orthopaedic Trauma Service Discharge Instructions   General Discharge Instructions  WEIGHT BEARING STATUS: Nonweightbearing left leg  RANGE OF MOTION/ACTIVITY: Unrestricted range of motion toes and knee  Wound  Care: Daily wound care starting on 01/09/2018 including external fixator pin care.  Please see below    Discharge Pin Site Instructions  Dress pins daily with Kerlix roll starting on POD 2. Wrap the Kerlix so that it tamps the skin down around the pin-skin interface to prevent/limit motion of the skin relative to the pin.  (Pin-skin motion is the primary cause of pain and infection related to external fixator pin sites).  Remove any crust or coagulum that may obstruct drainage with a saline moistened gauze or soap and water.  After POD 3, if there is no discernable drainage on the pin site dressing, the interval for change can by increased to every other day.  You may shower with the fixator, cleaning all pin sites gently with soap and water.  If you have a surgical wound this needs to be completely dry and without drainage before showering.  The extremity can be lifted by the fixator to facilitate wound care and transfers.  Notify the office/Doctor if you experience increasing drainage, redness, or pain from a pin site, or if you notice purulent (thick, snot-like) drainage.  Discharge Wound Care Instructions  Do NOT apply any ointments, solutions or lotions to pin sites or surgical wounds.  These prevent needed drainage and even though solutions like hydrogen peroxide kill bacteria, they also damage cells lining the pin sites that help fight infection.  Applying lotions or ointments can keep the wounds moist and can cause them to breakdown and open up as well. This can increase the risk for infection. When in doubt call the office.  Surgical incisions should be dressed daily.  If any drainage is noted, use one layer of adaptic, then gauze, Kerlix, and an ace wrap.  Once the incision is completely dry and without drainage, it may be left open to air out.  Showering may begin 36-48 hours later.  Cleaning gently with soap and water.  Traumatic wounds should be dressed daily as well.    One  layer of adaptic, gauze, Kerlix, then ace wrap.  The adaptic can be discontinued once the draining has ceased    If you have a wet to dry dressing: wet the gauze with saline the squeeze as much saline out so the gauze is moist (not soaking wet), place moistened gauze over wound, then place a dry gauze over the moist one, followed by Kerlix wrap, then ace wrap.   DVT/PE prophylaxis: Lovenox 40 mg subcutaneous injection daily for 21 days  Diet: as you were eating previously.  Can use over the counter stool softeners and bowel preparations, such as Miralax, to help with bowel movements.  Narcotics can be constipating.  Be sure to drink plenty of fluids  PAIN MEDICATION USE AND EXPECTATIONS  You have likely been given narcotic medications to help control your pain.  After a traumatic event that results in an fracture (broken bone) with or without surgery, it is ok to use narcotic pain medications to help control one's pain.  We understand that everyone responds to pain differently and each individual patient will be evaluated on a regular basis for the continued need for narcotic medications. Ideally, narcotic medication use should last no more than 6-8 weeks (coinciding with fracture healing).   As a patient it is your responsibility as well to monitor narcotic medication use and report the amount and frequency you use these medications when you come to your office visit.   We would also advise that if you are using narcotic medications, you should take a dose prior to therapy to maximize you participation.  IF YOU ARE ON NARCOTIC MEDICATIONS IT IS NOT PERMISSIBLE TO OPERATE A MOTOR VEHICLE (MOTORCYCLE/CAR/TRUCK/MOPED) OR HEAVY MACHINERY DO NOT MIX NARCOTICS WITH OTHER CNS (CENTRAL NERVOUS SYSTEM) DEPRESSANTS SUCH AS ALCOHOL   STOP SMOKING OR USING NICOTINE PRODUCTS!!!!  As discussed nicotine severely impairs your body's ability to heal surgical and traumatic wounds but also impairs bone healing.   Wounds and bone heal by forming microscopic blood vessels (angiogenesis) and nicotine is a vasoconstrictor (essentially, shrinks blood vessels).  Therefore, if vasoconstriction occurs to these microscopic blood vessels they essentially disappear and are unable to deliver necessary nutrients to the healing tissue.  This is one modifiable factor that you can do to dramatically increase your chances of healing your injury.    (This means no smoking, no nicotine gum, patches, etc)  DO NOT USE NONSTEROIDAL ANTI-INFLAMMATORY DRUGS (NSAID'S)  Using products such as Advil (ibuprofen), Aleve (naproxen), Motrin (ibuprofen) for additional pain control during fracture healing can delay and/or prevent the healing response.  If you would like to take over the counter (OTC) medication, Tylenol (acetaminophen) is ok.  However, some narcotic medications that are given for pain control contain acetaminophen as well. Therefore, you should not exceed more than 4000 mg of tylenol in a day if you do not have liver disease.  Also note that there are may OTC medicines, such as cold medicines and allergy medicines that my contain tylenol as well.  If you have any questions about medications and/or interactions please ask your doctor/PA or  your pharmacist.      ICE AND ELEVATE INJURED/OPERATIVE EXTREMITY  Using ice and elevating the injured extremity above your heart can help with swelling and pain control.  Icing in a pulsatile fashion, such as 20 minutes on and 20 minutes off, can be followed.    Do not place ice directly on skin. Make sure there is a barrier between to skin and the ice pack.    Using frozen items such as frozen peas works well as the conform nicely to the are that needs to be iced.  USE AN ACE WRAP OR TED HOSE FOR SWELLING CONTROL  In addition to icing and elevation, Ace wraps or TED hose are used to help limit and resolve swelling.  It is recommended to use Ace wraps or TED hose until you are informed to  stop.    When using Ace Wraps start the wrapping distally (farthest away from the body) and wrap proximally (closer to the body)   Example: If you had surgery on your leg or thing and you do not have a splint on, start the ace wrap at the toes and work your way up to the thigh        If you had surgery on your upper extremity and do not have a splint on, start the ace wrap at your fingers and work your way up to the upper arm  IF YOU ARE IN A SPLINT OR CAST DO NOT [place]   If your splint gets wet for any reason please contact the office immediately. You may shower in your splint or cast as long as you keep it dry.  This can be done by wrapping in a cast cover or garbage back (or similar)  Do Not stick any thing down your splint or cast such as pencils, money, or hangers to try and scratch yourself with.  If you feel itchy take benadryl as prescribed on the bottle for itching  IF YOU ARE IN A CAM BOOT (BLACK BOOT)  You may remove boot periodically. Perform daily dressing changes as noted below.  Wash the liner of the boot regularly and wear a sock when wearing the boot. It is recommended that you sleep in the boot until told otherwise  CALL THE OFFICE WITH ANY QUESTIONS OR CONCERNS: [phone]   Increase activity slowly as tolerated   Complete by:  As directed    Non weight bearing   Complete by:  As directed    Laterality:  left   Extremity:  Lower     Allergies as of 01/07/2018      Reactions   Morphine Shortness Of Breath   LIKELY DOSE RELATED   Latex Hives, Itching, Rash   Tetracycline Hives, Itching   Tetracyclines & Related Itching   Chlorhexidine Rash   Itching from CHG wipes in pre-op      Medication List    STOP taking these medications   acetaminophen 500 MG tablet Commonly known as:  TYLENOL   doxycycline 100 MG capsule Commonly known as:  VIBRAMYCIN     TAKE these medications   adapalene 0.1 % gel Commonly known as:  DIFFERIN Apply 1  application topically daily as needed (acne). Acne   ALPRAZolam 0.5 MG tablet Commonly known as:  XANAX Take 0.5 mg by mouth at bedtime.   ascorbic acid 500 MG tablet Commonly known as:  VITAMIN C Take 1 tablet (500 mg total) by mouth daily. Start taking on:  01/08/2018   calcium citrate 950 MG tablet Commonly known as:  CALCITRATE - dosed in mg elemental calcium Take 1 tablet (200 mg of elemental calcium total) by mouth 2 (two) times daily.   citalopram 20 MG tablet Commonly known as:  CELEXA Take 20 mg by mouth at bedtime.   DENTA 5000 PLUS 1.1 % Crea dental cream Generic drug:  sodium fluoride Place 1 application onto teeth 3 (three) times daily as needed. Brushes her teeth 2-3 times daily and uses each time she brushes   diphenhydrAMINE 25 MG tablet Commonly known as:  BENADRYL Take 25 mg by mouth daily as needed for allergies.   enoxaparin 40 MG/0.4ML injection Commonly known as:  LOVENOX Inject 0.4 mLs (40 mg total) into the skin daily.   enoxaparin Kit Commonly known as:  LOVENOX 1 kit by Does not apply route once for 1 dose.   montelukast 10 MG tablet Commonly known as:  SINGULAIR Take 10 mg by mouth at bedtime.   oxyCODONE 5 MG immediate release tablet Commonly known as:  Oxy IR/ROXICODONE Take 1-2 tablets (5-10 mg total) by mouth every 6 (six) hours as needed for breakthrough pain (for breakthrough pain only. use between percocet doses if needed).   oxyCODONE-acetaminophen 7.5-325 MG tablet Commonly known as:  PERCOCET Take 1 tablet by mouth every 6 (six) hours as needed for moderate pain or severe pain.   PROAIR HFA 108 (90 Base) MCG/ACT inhaler Generic drug:  albuterol Inhale 2 puffs into the lungs every 6 (six) hours as needed for wheezing or shortness of breath.   Vitamin D3 5000 units Tabs Take 1 tablet (5,000 Units total) by mouth 2 (two) times daily. What changed:    medication strength  how much to take  when to take this             Discharge Care Instructions  (From admission, onward)        Start     Ordered   01/07/18 0000  Non weight bearing    Question Answer Comment  Laterality left   Extremity Lower      01/07/18 1031     Follow-up Information    Altamese Collbran, MD. Schedule an appointment as soon as possible for a visit in 10 day(s).   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  Reviewed pin and wound care with patient.  Also instructed her to review her discharge instructions as well.  If it is okay for her to shower and clean leg and ex-fix with soap and water.               Lab workup essentially negative             Some vitamin D deficiency, supplementation increased        - Pain management:                        Discharge pain medication regimen will include Percocet, OxyIR for breakthrough pain and Flexeril for muscle spasms   - ABL anemia/Hemodynamics             Stable  Repeat BP pre-discharge is appropriate for pts normal trend    - Medical issues              Home meds   - DVT/PE prophylaxis:             Lovenox x 3 weeks   - ID:               periop abx completed    - Metabolic Bone Disease:             Metabolic bone diease work up ongoing             vitamin d to 4000-5000 IUs of vitamin D3 daily    - Activity:             NWB L leg             OOB ad lib otherwise   - FEN/GI prophylaxis/Foley/Lines:             Reg diet     - Impediments to fracture healing:             As above    - Dispo:             dc home today             Follow-up with orthopedics in 10-14 days   Signed:  Jari Pigg, PA-C [facility] [phone] (P) 01/07/2018, 10:36 AM

## 2018-01-07 NOTE — Progress Notes (Signed)
Orthopedic Trauma Service Progress Note   Patient ID: [name] Carr MRN: [id number] DOB/AGE: [birth year] 47 y.o.  Subjective:  Doing well No complaints No headaches, no lightheadedness, no epistaxis  Ready to go home The pain is tolerable.  Well controlled with oxycodone No new issues  Blood pressure of 220/67 appears to be an error, a typo.  Patient distinctly remembers it being 120/67.  We did recheck her blood pressure in the room today when dressing changes being performed and her BP was 113/65 with a heart rate of 82  Review of Systems  Constitutional: Negative for chills and fever.  Respiratory: Negative for shortness of breath and wheezing.   Cardiovascular: Negative for chest pain and palpitations.  Gastrointestinal: Negative for abdominal pain, nausea and vomiting.  Neurological: Negative for tingling, tremors and sensory change.    Objective:   VITALS:   Vitals:   01/06/18 0422 01/06/18 2000 01/07/18 0445 01/07/18 1004  BP: 126/62 119/64 (!) 220/67 113/65  Pulse: 79 80 72   Resp: 16 16 17    Temp: 99 F (37.2 C) 98.6 F (37 C) 98.4 F (36.9 C)   TempSrc: Oral Oral Oral   SpO2: 96% 99% 97%   Weight:      Height:        Estimated body mass index is 31.07 kg/m as calculated from the following:   Height as of this encounter: 5\' 5"  (1.651 m).   Weight as of this encounter: 84.7 kg (186 lb 11.7 oz).   Intake/Output      03/14 0701 - 03/15 0700 03/15 0701 - 03/16 0700   P.O. 2050    Total Intake(mL/kg) 2050 (24.2)    Net +2050         Urine Occurrence 7 x     PHYSICAL EXAM:   Gen: awake and alert, NAD, appears well Lungs: CTA B  Cardiac: RRR Abd: + BS, NTND  Ext:       Left Lower Extremity              Ex fix stable             Dressing c/d/i  Dressings removed.  All wounds look fantastic.  Scant bloody drainage.  No erythema no signs of infection.  Swelling is much improved.             Ext warm              DPN, SPN,  TN sensation intact             EHL, FHL, lesser toe motor intact             No pain with passive stretching   Assessment/Plan:  3 Days Post-Op   Principal Problem:   Displaced pilon fracture of left tibia, subsequent encounter for open fracture type IIIA, IIIB, or IIIC with nonunion Active Problems:   Vitamin D deficiency   Anxiety   Asthma   Pre-diabetes   Anti-infectives (From admission, onward)   Start     Dose/Rate Route Frequency Ordered Stop   01/04/18 1127  vancomycin (VANCOCIN) powder  Status:  Discontinued       As needed 01/04/18 1127 01/04/18 1257   01/04/18 1127  tobramycin (NEBCIN) powder  Status:  Discontinued       As needed 01/04/18 1128 01/04/18 1257    .  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  Reviewed pin and wound care with patient.  Also instructed her to review her discharge instructions as well.  If it is okay for her to shower and clean leg and ex-fix with soap and water.                          Lab workup essentially negative             Some vitamin D deficiency, supplementation increased        - Pain management:             Discharge pain medication regimen will include Percocet, OxyIR for breakthrough pain and Flexeril for muscle spasms   - ABL anemia/Hemodynamics             Stable   - Medical issues               Home meds   - DVT/PE prophylaxis:             Lovenox x 3 weeks   - ID:              periop abx completed    - Metabolic Bone Disease:             Metabolic bone diease work up ongoing             vitamin d to 4000-5000 IUs of vitamin D3 daily    - Activity:             NWB L leg             OOB ad lib otherwise   - FEN/GI prophylaxis/Foley/Lines:             Reg diet     - Impediments to fracture healing:             As above    - Dispo:             dc home today  Follow-up with orthopedics in 10-14 days    Jari Pigg, PA-C [facility] [phone] (P) [phone] [name] Carr (C) 01/07/2018, 10:19 AM

## 2018-01-07 NOTE — Discharge Instructions (Signed)
Orthopaedic Trauma Service Discharge Instructions   General Discharge Instructions  WEIGHT BEARING STATUS: Nonweightbearing left leg  RANGE OF MOTION/ACTIVITY: Unrestricted range of motion toes and knee  Wound Care: Daily wound care starting on 01/09/2018 including external fixator pin care.  Please see below    Discharge Pin Site Instructions  Dress pins daily with Kerlix roll starting on POD 2. Wrap the Kerlix so that it tamps the skin down around the pin-skin interface to prevent/limit motion of the skin relative to the pin.  (Pin-skin motion is the primary cause of pain and infection related to external fixator pin sites).  Remove any crust or coagulum that may obstruct drainage with a saline moistened gauze or soap and water.  After POD 3, if there is no discernable drainage on the pin site dressing, the interval for change can by increased to every other day.  You may shower with the fixator, cleaning all pin sites gently with soap and water.  If you have a surgical wound this needs to be completely dry and without drainage before showering.  The extremity can be lifted by the fixator to facilitate wound care and transfers.  Notify the office/Doctor if you experience increasing drainage, redness, or pain from a pin site, or if you notice purulent (thick, snot-like) drainage.  Discharge Wound Care Instructions  Do NOT apply any ointments, solutions or lotions to pin sites or surgical wounds.  These prevent needed drainage and even though solutions like hydrogen peroxide kill bacteria, they also damage cells lining the pin sites that help fight infection.  Applying lotions or ointments can keep the wounds moist and can cause them to breakdown and open up as well. This can increase the risk for infection. When in doubt call the office.  Surgical incisions should be dressed daily.  If any drainage is noted, use one layer of adaptic, then gauze, Kerlix, and an ace wrap.  Once the  incision is completely dry and without drainage, it may be left open to air out.  Showering may begin 36-48 hours later.  Cleaning gently with soap and water.  Traumatic wounds should be dressed daily as well.    One layer of adaptic, gauze, Kerlix, then ace wrap.  The adaptic can be discontinued once the draining has ceased    If you have a wet to dry dressing: wet the gauze with saline the squeeze as much saline out so the gauze is moist (not soaking wet), place moistened gauze over wound, then place a dry gauze over the moist one, followed by Kerlix wrap, then ace wrap.   DVT/PE prophylaxis: Lovenox 40 mg subcutaneous injection daily for 21 days  Diet: as you were eating previously.  Can use over the counter stool softeners and bowel preparations, such as Miralax, to help with bowel movements.  Narcotics can be constipating.  Be sure to drink plenty of fluids  PAIN MEDICATION USE AND EXPECTATIONS  You have likely been given narcotic medications to help control your pain.  After a traumatic event that results in an fracture (broken bone) with or without surgery, it is ok to use narcotic pain medications to help control one's pain.  We understand that everyone responds to pain differently and each individual patient will be evaluated on a regular basis for the continued need for narcotic medications. Ideally, narcotic medication use should last no more than 6-8 weeks (coinciding with fracture healing).   As a patient it is your responsibility as well to monitor narcotic  medication use and report the amount and frequency you use these medications when you come to your office visit.   We would also advise that if you are using narcotic medications, you should take a dose prior to therapy to maximize you participation.  IF YOU ARE ON NARCOTIC MEDICATIONS IT IS NOT PERMISSIBLE TO OPERATE A MOTOR VEHICLE (MOTORCYCLE/CAR/TRUCK/MOPED) OR HEAVY MACHINERY DO NOT MIX NARCOTICS WITH OTHER CNS (CENTRAL  NERVOUS SYSTEM) DEPRESSANTS SUCH AS ALCOHOL   STOP SMOKING OR USING NICOTINE PRODUCTS!!!!  As discussed nicotine severely impairs your body's ability to heal surgical and traumatic wounds but also impairs bone healing.  Wounds and bone heal by forming microscopic blood vessels (angiogenesis) and nicotine is a vasoconstrictor (essentially, shrinks blood vessels).  Therefore, if vasoconstriction occurs to these microscopic blood vessels they essentially disappear and are unable to deliver necessary nutrients to the healing tissue.  This is one modifiable factor that you can do to dramatically increase your chances of healing your injury.    (This means no smoking, no nicotine gum, patches, etc)  DO NOT USE NONSTEROIDAL ANTI-INFLAMMATORY DRUGS (NSAID'S)  Using products such as Advil (ibuprofen), Aleve (naproxen), Motrin (ibuprofen) for additional pain control during fracture healing can delay and/or prevent the healing response.  If you would like to take over the counter (OTC) medication, Tylenol (acetaminophen) is ok.  However, some narcotic medications that are given for pain control contain acetaminophen as well. Therefore, you should not exceed more than 4000 mg of tylenol in a day if you do not have liver disease.  Also note that there are may OTC medicines, such as cold medicines and allergy medicines that my contain tylenol as well.  If you have any questions about medications and/or interactions please ask your doctor/PA or your pharmacist.      ICE AND ELEVATE INJURED/OPERATIVE EXTREMITY  Using ice and elevating the injured extremity above your heart can help with swelling and pain control.  Icing in a pulsatile fashion, such as 20 minutes on and 20 minutes off, can be followed.    Do not place ice directly on skin. Make sure there is a barrier between to skin and the ice pack.    Using frozen items such as frozen peas works well as the conform nicely to the are that needs to be iced.  USE AN  ACE WRAP OR TED HOSE FOR SWELLING CONTROL  In addition to icing and elevation, Ace wraps or TED hose are used to help limit and resolve swelling.  It is recommended to use Ace wraps or TED hose until you are informed to stop.    When using Ace Wraps start the wrapping distally (farthest away from the body) and wrap proximally (closer to the body)   Example: If you had surgery on your leg or thing and you do not have a splint on, start the ace wrap at the toes and work your way up to the thigh        If you had surgery on your upper extremity and do not have a splint on, start the ace wrap at your fingers and work your way up to the upper arm  IF YOU ARE IN A SPLINT OR CAST DO NOT [place]   If your splint gets wet for any reason please contact the office immediately. You may shower in your splint or cast as long as you keep it dry.  This can be done by wrapping in a cast  cover or garbage back (or similar)  Do Not stick any thing down your splint or cast such as pencils, money, or hangers to try and scratch yourself with.  If you feel itchy take benadryl as prescribed on the bottle for itching  IF YOU ARE IN A CAM BOOT (BLACK BOOT)  You may remove boot periodically. Perform daily dressing changes as noted below.  Wash the liner of the boot regularly and wear a sock when wearing the boot. It is recommended that you sleep in the boot until told otherwise  CALL THE OFFICE WITH ANY QUESTIONS OR CONCERNS: [phone]

## 2018-01-09 LAB — AEROBIC/ANAEROBIC CULTURE W GRAM STAIN (SURGICAL/DEEP WOUND): Culture: NO GROWTH

## 2018-01-09 LAB — AEROBIC/ANAEROBIC CULTURE (SURGICAL/DEEP WOUND): CULTURE: NO GROWTH

## 2018-01-10 LAB — AEROBIC/ANAEROBIC CULTURE W GRAM STAIN (SURGICAL/DEEP WOUND)

## 2018-01-10 LAB — AEROBIC/ANAEROBIC CULTURE (SURGICAL/DEEP WOUND): CULTURE: NO GROWTH

## 2018-01-11 LAB — DRUG SCREEN 10 W/CONF, SERUM
AMPHETAMINES, IA: NEGATIVE ng/mL
BARBITURATES, IA: NEGATIVE ug/mL
BENZODIAZEPINES, IA: NEGATIVE ng/mL
Cocaine & Metabolite, IA: NEGATIVE ng/mL
METHADONE, IA: NEGATIVE ng/mL
Opiates, IA: NEGATIVE ng/mL
Oxycodones, IA: POSITIVE ng/mL
PROPOXYPHENE, IA: NEGATIVE ng/mL
Phencyclidine, IA: NEGATIVE ng/mL
THC(Marijuana) Metabolite, IA: NEGATIVE ng/mL

## 2018-01-11 LAB — OXYCODONES,MS,WB/SP RFX
Oxycocone: 11.7 ng/mL
Oxycodones Confirmation: POSITIVE
Oxymorphone: NEGATIVE ng/mL

## 2018-01-13 ENCOUNTER — Encounter (HOSPITAL_COMMUNITY): Payer: Self-pay | Admitting: Orthopedic Surgery

## 2018-02-07 ENCOUNTER — Ambulatory Visit (INDEPENDENT_AMBULATORY_CARE_PROVIDER_SITE_OTHER): Payer: BLUE CROSS/BLUE SHIELD | Admitting: Orthopaedic Surgery

## 2018-02-15 NOTE — Pre-Procedure Instructions (Signed)
[name] Carr  02/15/2018      [id number] - [place], [place] - [address] AT [address] [place] [place] [address] Phone: [phone] Fax: [phone]    Your procedure is scheduled on  Tuesday 02/22/18  Report to [facility] Admitting at 600 A.M.  Call this number if you have problems the morning of surgery:  [phone]   Remember:  Do not eat food or drink liquids after midnight.  Take these medicines the morning of surgery with A SIP OF WATER -   PROAIR INHALER  7 days prior to surgery STOP taking any Aspirin(unless otherwise instructed by your surgeon), Aleve, Naproxen, Ibuprofen, Motrin, Advil, Goody's, BC's, all herbal medications, fish oil, and all vitamins   Do not wear jewelry, make-up or nail polish.  Do not wear lotions, powders, or perfumes, or deodorant.  Do not shave 48 hours prior to surgery.  Men may shave face and neck.  Do not bring valuables to the hospital.  [facility] is not responsible for any belongings or valuables.  Contacts, dentures or bridgework may not be worn into surgery.  Leave your suitcase in the car.  After surgery it may be brought to your room.  For patients admitted to the hospital, discharge time will be determined by your treatment team.  Patients discharged the day of surgery will not be allowed to drive home.   Name and phone number of your driver:    Special instructions:  [facility] - Preparing for Surgery  Before surgery, you can play an important role.  Because skin is not sterile, your skin needs to be as free of germs as possible.  You can reduce the number of germs on you skin by washing with CHG (chlorahexidine gluconate) soap before surgery.  CHG is an antiseptic cleaner which kills germs and bonds with the skin to continue killing germs even after washing.  Please DO NOT use if you have an allergy to CHG or antibacterial soaps.  If your skin becomes  reddened/irritated stop using the CHG and inform your nurse when you arrive at Short Stay.  Do not shave (including legs and underarms) for at least 48 hours prior to the first CHG shower.  You may shave your face.  Please follow these instructions carefully:   1.  Shower with CHG Soap the night before surgery and the                                morning of Surgery.  2.  If you choose to wash your hair, wash your hair first as usual with your       normal shampoo.  3.  After you shampoo, rinse your hair and body thoroughly to remove the                      Shampoo.  4.  Use CHG as you would any other liquid soap.  You can apply chg directly       to the skin and wash gently with scrungie or a clean washcloth.  5.  Apply the CHG Soap to your body ONLY FROM THE NECK DOWN.        Do not use on open wounds or open sores.  Avoid contact with your eyes,       ears, mouth and genitals (private parts).  Wash  genitals (private parts)       with your normal soap.  6.  Wash thoroughly, paying special attention to the area where your surgery        will be performed.  7.  Thoroughly rinse your body with warm water from the neck down.  8.  DO NOT shower/wash with your normal soap after using and rinsing off       the CHG Soap.  9.  Pat yourself dry with a clean towel.            10.  Wear clean pajamas.            11.  Place clean sheets on your bed the night of your first shower and do not        sleep with pets.  Day of Surgery  Do not apply any lotions/deoderants the morning of surgery.  Please wear clean clothes to the hospital/surgery center.     Please read over the following fact sheets that you were given. MRSA Information and Surgical Site Infection Prevention

## 2018-02-16 ENCOUNTER — Encounter (HOSPITAL_COMMUNITY): Payer: Self-pay

## 2018-02-16 ENCOUNTER — Other Ambulatory Visit: Payer: Self-pay

## 2018-02-16 ENCOUNTER — Encounter (HOSPITAL_COMMUNITY)
Admission: RE | Admit: 2018-02-16 | Discharge: 2018-02-16 | Disposition: A | Discharge status: Home / Self Care | Payer: BLUE CROSS/BLUE SHIELD | Source: Ambulatory Visit | Attending: Orthopedic Surgery | Admitting: Orthopedic Surgery

## 2018-02-16 DIAGNOSIS — Z01812 Encounter for preprocedural laboratory examination: Secondary | ICD-10-CM | POA: Insufficient documentation

## 2018-02-16 LAB — COMPREHENSIVE METABOLIC PANEL
ALT: 17 U/L (ref 14–54)
AST: 15 U/L (ref 15–41)
Albumin: 3.8 g/dL (ref 3.5–5.0)
Alkaline Phosphatase: 89 U/L (ref 38–126)
Anion gap: 9 (ref 5–15)
BUN: 10 mg/dL (ref 6–20)
CO2: 25 mmol/L (ref 22–32)
CREATININE: 0.73 mg/dL (ref 0.44–1.00)
Calcium: 9.4 mg/dL (ref 8.9–10.3)
Chloride: 103 mmol/L (ref 101–111)
GFR calc Af Amer: >60 mL/min (ref >60)
GLUCOSE: 126 mg/dL — AB (ref 65–99)
Potassium: 3.7 mmol/L (ref 3.5–5.1)
SODIUM: 137 mmol/L (ref 135–145)
Total Bilirubin: 0.6 mg/dL (ref 0.3–1.2)
Total Protein: 6.9 g/dL (ref 6.5–8.1)

## 2018-02-16 LAB — CBC WITH DIFFERENTIAL/PLATELET
BASOS ABS: 0 10*3/uL (ref 0.0–0.1)
Basophils Relative: 0 %
Eosinophils Absolute: 0.3 10*3/uL (ref 0.0–0.7)
Eosinophils Relative: 4 %
HCT: 36.4 % (ref 36.0–46.0)
Hemoglobin: 11.5 g/dL — ABNORMAL LOW (ref 12.0–15.0)
Lymphocytes Relative: 25 %
Lymphs Abs: 1.9 10*3/uL (ref 0.7–4.0)
MCH: 26.9 pg (ref 26.0–34.0)
MCHC: 31.6 g/dL (ref 30.0–36.0)
MCV: 85 fL (ref 78.0–100.0)
Monocytes Absolute: 0.6 10*3/uL (ref 0.1–1.0)
Monocytes Relative: 8 %
Neutro Abs: 4.6 10*3/uL (ref 1.7–7.7)
Neutrophils Relative %: 63 %
Platelets: 497 10*3/uL — ABNORMAL HIGH (ref 150–400)
RBC: 4.28 MIL/uL (ref 3.87–5.11)
RDW: 15.2 % (ref 11.5–15.5)
WBC: 7.3 10*3/uL (ref 4.0–10.5)

## 2018-02-16 LAB — URINALYSIS, ROUTINE W REFLEX MICROSCOPIC
Bilirubin Urine: NEGATIVE
Glucose, UA: NEGATIVE mg/dL
Hgb urine dipstick: NEGATIVE
KETONES UR: NEGATIVE mg/dL
LEUKOCYTES UA: NEGATIVE
NITRITE: NEGATIVE
PROTEIN: NEGATIVE mg/dL
Specific Gravity, Urine: 1.004 — ABNORMAL LOW (ref 1.005–1.030)
pH: 6 (ref 5.0–8.0)

## 2018-02-16 LAB — APTT: aPTT: 29 seconds (ref 24–36)

## 2018-02-16 LAB — TYPE AND SCREEN
ABO/RH(D): A POS
Antibody Screen: NEGATIVE

## 2018-02-16 LAB — PROTIME-INR
INR: 0.99
Prothrombin Time: 13 seconds (ref 11.4–15.2)

## 2018-02-16 LAB — SURGICAL PCR SCREEN
MRSA, PCR: NEGATIVE
STAPHYLOCOCCUS AUREUS: NEGATIVE

## 2018-02-16 NOTE — Progress Notes (Signed)
PCP is [place] States she saw Dr Daneen Schick with  Cardiology  over 15 years ago due to her birth control causing problems, she had an echo and stress test that was normal and once she stopped the birth control she was fine Denies cough, fever, or chest pain. Reports last dose of lovenox was 02-03-18 Allergy to CHG instructed to use antibacterial soap the night before surgery and the morning of surgery. Voices understanding.

## 2018-02-21 ENCOUNTER — Encounter (HOSPITAL_COMMUNITY): Payer: Self-pay | Admitting: Anesthesiology

## 2018-02-21 NOTE — Anesthesia Preprocedure Evaluation (Addendum)
Anesthesia Evaluation  Patient identified by MRN, date of birth, ID band Patient awake    Reviewed: Allergy & Precautions, NPO status , Patient's Chart, lab work & pertinent test results  History of Anesthesia Complications (+) PONV and history of anesthetic complications  Airway Mallampati: I       Dental no notable dental hx. (+) Teeth Intact   Pulmonary asthma , former smoker,    Pulmonary exam normal breath sounds clear to auscultation       Cardiovascular + DVT  Normal cardiovascular exam Rhythm:Regular Rate:Normal     Neuro/Psych PSYCHIATRIC DISORDERS Anxiety negative neurological ROS     GI/Hepatic negative GI ROS, Neg liver ROS,   Endo/Other  negative endocrine ROS  Renal/GU negative Renal ROS     Musculoskeletal negative musculoskeletal ROS (+)   Abdominal Normal abdominal exam  (+) - obese,   Peds  Hematology  (+) anemia ,   Anesthesia Other Findings left tibia nonunion  Reproductive/Obstetrics hcg negative                            Anesthesia Physical  Anesthesia Plan  ASA: II  Anesthesia Plan: General   Post-op Pain Management:    Induction: Intravenous  PONV Risk Score and Plan: 4 or greater and Scopolamine patch - Pre-op, Midazolam, Dexamethasone, Ondansetron and Treatment may vary due to age or medical condition  Airway Management Planned: Oral ETT  Additional Equipment:   Intra-op Plan:   Post-operative Plan: Extubation in OR  Informed Consent: I have reviewed the patients History and Physical, chart, labs and discussed the procedure including the risks, benefits and alternatives for the proposed anesthesia with the patient or authorized representative who has indicated his/her understanding and acceptance.   Dental advisory given  Plan Discussed with: CRNA and Surgeon  Anesthesia Plan Comments:        Anesthesia Quick Evaluation

## 2018-02-21 NOTE — H&P (Addendum)
Orthopaedic Trauma Service (OTS) Consult   Patient ID: COREEN SHIPPEE MRN: [id number] DOB/AGE: 03-29-1971 47 y.o.    HPI: MADALYNE HUSK is an 47 y.o.white female has undergone numerous surgeries to left distal tibia.  Please see consult note from January 03, 2018 for full summary of clinical course as well as discharge summary.  Patient was seen and evaluated by the orthopedic trauma service on 01/03/2018 after she presented to the emergency room with acute hardware failure.  She was taken to the operating room by the OTS for removal of hardware placement of antibiotic spacer and placement of external fixator with a hardware holiday.  Patient had extensive metabolic bone work-up which was essentially negative with the exception of some mild vitamin D deficiency.  Cultures did not have any organisms.  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  We do anticipate using reamed intramedullary aspirate from her right femur.  Past Medical History:  Diagnosis Date  . Anxiety   . Asthma   . Complication of anesthesia    one time had difficulty waking up  . DVT (deep venous thrombosis) ([place])   . IUD (intrauterine device) in place    Paraguard IUD inserted 2008  . Pneumonia    walking pneumonia  . PONV (postoperative nausea and vomiting)    'MANY MANY MANY years ago"  . Pre-diabetes    cnotrolled with diet an exercise    Past Surgical History:  Procedure Laterality Date  . BREAST SURGERY     reduction  . EXTERNAL FIXATION LEG Left 10/26/2016   Procedure: EXTERNAL FIXATION ANKLE ;  Surgeon: Mcarthur Rossetti, MD;  Location: [facility];  Service: Orthopedics;  Laterality: Left;  . EXTERNAL FIXATION LEG Left 01/04/2018   Procedure: EXTERNAL FIXATION ANKLE;  Surgeon: Altamese [place], MD;  Location: [place];  Service: Orthopedics;  Laterality: Left;  . EXTERNAL FIXATION REMOVAL Left 11/03/2016   Procedure: REMOVAL  EXTERNAL FIXATION ANKLE;  Surgeon: Mcarthur Rossetti, MD;  Location: [place];  Service: Orthopedics;  Laterality: Left;  . FRACTURE SURGERY     not until 10/2016  . HARDWARE REMOVAL Left 03/23/2017   Procedure: HARDWARE REMOVAL;  Surgeon: Mcarthur Rossetti, MD;  Location: [place];  Service: Orthopedics;  Laterality: Left;  . HARDWARE REMOVAL Left 01/04/2018   Procedure: REMOVAL HARDWARE LEFT ANKLE WITH DEBRIDEMENT AND EXTERNAL FIXATION;  Surgeon: Altamese [place], MD;  Location: [place];  Service: Orthopedics;  Laterality: Left;  . IRRIGATION AND DEBRIDEMENT FOOT Left 10/26/2016   Procedure: IRRIGATION AND DEBRIDEMENT;  Surgeon: Mcarthur Rossetti, MD;  Location: [facility];  Service: Orthopedics;  Laterality: Left;  . ORIF ANKLE FRACTURE Left 11/03/2016   Procedure: OPEN REDUCTION INTERNAL FIXATION (ORIF) LEFT PILON FRACTURE;  Surgeon: Mcarthur Rossetti, MD;  Location: [place];  Service: Orthopedics;  Laterality: Left;  . ORIF ANKLE FRACTURE Left 03/23/2017   Procedure: REMOVAL OF HARDWARE LEFT ANKLE, REVISION OPEN REDUCTION INTERNAL FIXATION (ORIF) LEFT PILON FRACTURE, BONE GRAFT LEFT TIBIA;  Surgeon: Mcarthur Rossetti, MD;  Location: [place];  Service: Orthopedics;  Laterality: Left;  . ORIF ANKLE FRACTURE Left 07/20/2017   Procedure: Bone grafting left distal tibia non-union, harvest graft from left femur;  Surgeon: Mcarthur Rossetti, MD;  Location: [place];  Service: Orthopedics;  Laterality: Left;  . PILONIDAL CYST EXCISION      Family History  Problem Relation Age of Onset  . Breast cancer Mother   .  Diabetes Mellitus II Father     Social History:  reports that she has quit smoking. She has never used smokeless tobacco. She reports that she drinks alcohol. She reports that she does not use drugs.  Allergies:  Allergies  Allergen Reactions  . Morphine Shortness Of Breath    LIKELY DOSE RELATED  . Latex Hives, Itching and Rash  . Tetracycline Hives, Itching and Rash  .  Chlorhexidine Itching and Rash    CHG wipes in pre-op    Medications:  I have reviewed the patient's current medications. Prior to Admission:  Current Meds  Medication Sig  . acetaminophen (TYLENOL) 500 MG tablet Take 1,000 mg by mouth every 6 (six) hours as needed (for pain.).  Marland Kitchen adapalene (DIFFERIN) 0.1 % gel Apply 1 application topically daily as needed (acne). Acne   . ALPRAZolam (XANAX) 0.5 MG tablet Take 0.5 mg by mouth at bedtime.   . calcium citrate (CALCITRATE - DOSED IN MG ELEMENTAL CALCIUM) 950 MG tablet Take 1 tablet (200 mg of elemental calcium total) by mouth 2 (two) times daily.  . cholecalciferol 5000 units TABS Take 1 tablet (5,000 Units total) by mouth 2 (two) times daily.  . montelukast (SINGULAIR) 10 MG tablet Take 10 mg by mouth at bedtime.  Marland Kitchen PROAIR HFA 108 (90 Base) MCG/ACT inhaler Inhale 2 puffs into the lungs every 6 (six) hours as needed for wheezing or shortness of breath.   . vitamin C (VITAMIN C) 500 MG tablet Take 1 tablet (500 mg total) by mouth daily.  . [DISCONTINUED] citalopram (CELEXA) 20 MG tablet Take 20 mg by mouth at bedtime.     Labs  Results for ASHLEI, CHINCHILLA (MRN [id number]) as of 02/21/2018 21:31  Ref. Range 02/16/2018 15:12  Sodium Latest Ref Range: 135 - 145 mmol/L 137  Potassium Latest Ref Range: 3.5 - 5.1 mmol/L 3.7  Chloride Latest Ref Range: 101 - 111 mmol/L 103  CO2 Latest Ref Range: 22 - 32 mmol/L 25  Glucose Latest Ref Range: 65 - 99 mg/dL 126 (H)  BUN Latest Ref Range: 6 - 20 mg/dL 10  Creatinine Latest Ref Range: 0.44 - 1.00 mg/dL 0.73  Calcium Latest Ref Range: 8.9 - 10.3 mg/dL 9.4  Anion gap Latest Ref Range: 5 - 15  9  Alkaline Phosphatase Latest Ref Range: 38 - 126 U/L 89  Albumin Latest Ref Range: 3.5 - 5.0 g/dL 3.8  AST Latest Ref Range: 15 - 41 U/L 15  ALT Latest Ref Range: 14 - 54 U/L 17  Total Protein Latest Ref Range: 6.5 - 8.1 g/dL 6.9  Total Bilirubin Latest Ref Range: 0.3 - 1.2 mg/dL 0.6  GFR, Est Non African  American Latest Ref Range: >60 mL/min >60  GFR, Est African American Latest Ref Range: >60 mL/min >60  WBC Latest Ref Range: 4.0 - 10.5 K/uL 7.3  RBC Latest Ref Range: 3.87 - 5.11 MIL/uL 4.28  Hemoglobin Latest Ref Range: 12.0 - 15.0 g/dL 11.5 (L)  HCT Latest Ref Range: 36.0 - 46.0 % 36.4  MCV Latest Ref Range: 78.0 - 100.0 fL 85.0  MCH Latest Ref Range: 26.0 - 34.0 pg 26.9  MCHC Latest Ref Range: 30.0 - 36.0 g/dL 31.6  RDW Latest Ref Range: 11.5 - 15.5 % 15.2  Platelets Latest Ref Range: 150 - 400 K/uL 497 (H)  Neutrophils Latest Units: % 63  Lymphocytes Latest Units: % 25  Monocytes Relative Latest Units: % 8  Eosinophil Latest Units: % 4  Basophil Latest  Units: % 0  NEUT# Latest Ref Range: 1.7 - 7.7 K/uL 4.6  Lymphocyte # Latest Ref Range: 0.7 - 4.0 K/uL 1.9  Monocyte # Latest Ref Range: 0.1 - 1.0 K/uL 0.6  Eosinophils Absolute Latest Ref Range: 0.0 - 0.7 K/uL 0.3  Basophils Absolute Latest Ref Range: 0.0 - 0.1 K/uL 0.0  Prothrombin Time Latest Ref Range: 11.4 - 15.2 seconds 13.0  INR Unknown 0.99  APTT Latest Ref Range: 24 - 36 seconds 29   Review of Systems  Constitutional: Negative for chills and fever.  Respiratory: Negative for shortness of breath.   Cardiovascular: Negative for chest pain and palpitations.  Gastrointestinal: Negative for abdominal pain, nausea and vomiting.  Genitourinary: Negative for dysuria.  Neurological: Negative for tingling.   Last menstrual period 02/05/2018. Physical Exam  Constitutional: She is oriented to person, place, and time. She appears well-developed and well-nourished. No distress.  HENT:  Head: Normocephalic and atraumatic.  Eyes: Pupils are equal, round, and reactive to light. EOM are normal.  Cardiovascular: Normal rate and regular rhythm.  Pulmonary/Chest: Effort normal and breath sounds normal. No respiratory distress.  Abdominal: Soft. Bowel sounds are normal. She exhibits no distension.  Musculoskeletal:  Left Lower  Extremity  Ex fix is stable pinsites look good  Surgical wounds healed DPN, SPN, TN sensation intact EHL, FHL, lesser toe motor intact Ext warm  +DP pulse Swelling very well controlled  Right Lower Extremity No acute findings about the hip Motor and sensory functions intact Ext warm +DP pulse No swelling    Neurological: She is alert and oriented to person, place, and time.  Skin: Skin is warm and dry.     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  Reamed intramedullary aspirate harvest   Plan for IMN of L tibia   admit postop for pain control and therapy from the right femur  Nonweightbearing for 6 weeks postop   risks and benefits reviewed with the patient she wishes to proceed  Extensive metabolic bone work-up was completed which was only notable for some mild vitamin D deficiency.  Will continue with supplementation.  No additional metabolic work-up warranted at this time    - Pain management:  Titrate accordingly postoperatively  - Medical issues   Home medications  - DVT/PE prophylaxis:  Lovenox postoperatively for 21days and will leave him alone and having no known live alone please okay  - ID:   periop abx  - Metabolic Bone Disease:  + vitamin d deficiency    Continue supplementation   - FEN/GI prophylaxis/Foley/Lines:  NPO  - Dispo:  OR for repair of L distal tib-fib nonunion   Jari Pigg, PA-C [facility] [phone] (C) [phone] (O) 02/21/2018, 9:31 PM

## 2018-02-22 ENCOUNTER — Inpatient Hospital Stay (HOSPITAL_COMMUNITY): Payer: BLUE CROSS/BLUE SHIELD

## 2018-02-22 ENCOUNTER — Encounter (HOSPITAL_COMMUNITY)
Admission: RE | Disposition: A | Discharge status: Home Health | Payer: Self-pay | Source: Ambulatory Visit | Attending: Orthopedic Surgery

## 2018-02-22 ENCOUNTER — Inpatient Hospital Stay (HOSPITAL_COMMUNITY): Payer: BLUE CROSS/BLUE SHIELD | Admitting: Anesthesiology

## 2018-02-22 ENCOUNTER — Inpatient Hospital Stay (HOSPITAL_COMMUNITY)
Admission: RE | Admit: 2018-02-22 | Discharge: 2018-02-24 | DRG: 493 | Disposition: A | Discharge status: Home Health | Payer: BLUE CROSS/BLUE SHIELD | Source: Ambulatory Visit | Attending: Orthopedic Surgery | Admitting: Orthopedic Surgery

## 2018-02-22 ENCOUNTER — Encounter (HOSPITAL_COMMUNITY): Payer: Self-pay | Admitting: *Deleted

## 2018-02-22 DIAGNOSIS — X58XXXD Exposure to other specified factors, subsequent encounter: Secondary | ICD-10-CM | POA: Diagnosis present

## 2018-02-22 DIAGNOSIS — J45909 Unspecified asthma, uncomplicated: Secondary | ICD-10-CM | POA: Diagnosis present

## 2018-02-22 DIAGNOSIS — Z419 Encounter for procedure for purposes other than remedying health state, unspecified: Secondary | ICD-10-CM

## 2018-02-22 DIAGNOSIS — D62 Acute posthemorrhagic anemia: Secondary | ICD-10-CM | POA: Diagnosis not present

## 2018-02-22 DIAGNOSIS — S82292K Other fracture of shaft of left tibia, subsequent encounter for closed fracture with nonunion: Principal | ICD-10-CM

## 2018-02-22 DIAGNOSIS — Z86718 Personal history of other venous thrombosis and embolism: Secondary | ICD-10-CM | POA: Diagnosis not present

## 2018-02-22 DIAGNOSIS — E8889 Other specified metabolic disorders: Secondary | ICD-10-CM | POA: Diagnosis present

## 2018-02-22 DIAGNOSIS — Z87891 Personal history of nicotine dependence: Secondary | ICD-10-CM

## 2018-02-22 DIAGNOSIS — Z975 Presence of (intrauterine) contraceptive device: Secondary | ICD-10-CM

## 2018-02-22 DIAGNOSIS — S82302N Unspecified fracture of lower end of left tibia, subsequent encounter for open fracture type IIIA, IIIB, or IIIC with nonunion: Secondary | ICD-10-CM | POA: Diagnosis present

## 2018-02-22 DIAGNOSIS — E559 Vitamin D deficiency, unspecified: Secondary | ICD-10-CM | POA: Diagnosis present

## 2018-02-22 DIAGNOSIS — S82832K Other fracture of upper and lower end of left fibula, subsequent encounter for closed fracture with nonunion: Secondary | ICD-10-CM | POA: Diagnosis not present

## 2018-02-22 DIAGNOSIS — R7303 Prediabetes: Secondary | ICD-10-CM | POA: Diagnosis present

## 2018-02-22 DIAGNOSIS — F419 Anxiety disorder, unspecified: Secondary | ICD-10-CM | POA: Diagnosis present

## 2018-02-22 DIAGNOSIS — M79662 Pain in left lower leg: Secondary | ICD-10-CM | POA: Diagnosis present

## 2018-02-22 DIAGNOSIS — S82832N Other fracture of upper and lower end of left fibula, subsequent encounter for open fracture type IIIA, IIIB, or IIIC with nonunion: Secondary | ICD-10-CM

## 2018-02-22 HISTORY — PX: TIBIA IM NAIL INSERTION: SHX2516

## 2018-02-22 HISTORY — PX: IM NAILING TIBIA: SUR734

## 2018-02-22 HISTORY — PX: HARVEST BONE GRAFT: SHX377

## 2018-02-22 HISTORY — PX: EXTERNAL FIXATION REMOVAL: SHX5040

## 2018-02-22 LAB — CBC
HCT: 28.4 % — ABNORMAL LOW (ref 36.0–46.0)
Hemoglobin: 8.9 g/dL — ABNORMAL LOW (ref 12.0–15.0)
MCH: 26.9 pg (ref 26.0–34.0)
MCHC: 31.3 g/dL (ref 30.0–36.0)
MCV: 85.8 fL (ref 78.0–100.0)
PLATELETS: 460 10*3/uL — AB (ref 150–400)
RBC: 3.31 MIL/uL — AB (ref 3.87–5.11)
RDW: 15.2 % (ref 11.5–15.5)
WBC: 16.8 10*3/uL — ABNORMAL HIGH (ref 4.0–10.5)

## 2018-02-22 LAB — CREATININE, SERUM
Creatinine, Ser: 0.83 mg/dL (ref 0.44–1.00)
GFR calc Af Amer: >60 mL/min (ref >60)
GFR calc non Af Amer: >60 mL/min (ref >60)

## 2018-02-22 LAB — POCT PREGNANCY, URINE: PREG TEST UR: NEGATIVE

## 2018-02-22 SURGERY — INSERTION, INTRAMEDULLARY ROD, TIBIA
Anesthesia: General | Laterality: Right

## 2018-02-22 MED ORDER — ACETAMINOPHEN 325 MG PO TABS: 325.0000 mg | ORAL_TABLET | Freq: Four times a day (QID) | ORAL | Status: DC | PRN

## 2018-02-22 MED ORDER — GABAPENTIN 300 MG PO CAPS
300.0000 mg | ORAL_CAPSULE | Freq: Once | ORAL | Status: AC
  Administered 2018-02-22: 300 mg via ORAL
  Filled 2018-02-22: qty 1

## 2018-02-22 MED ORDER — LACTATED RINGERS IV SOLN: INTRAVENOUS | Status: DC

## 2018-02-22 MED ORDER — ROCURONIUM BROMIDE 10 MG/ML (PF) SYRINGE
PREFILLED_SYRINGE | INTRAVENOUS | Status: AC
  Filled 2018-02-22: qty 5

## 2018-02-22 MED ORDER — EPHEDRINE SULFATE 50 MG/ML IJ SOLN
INTRAMUSCULAR | Status: DC | PRN
  Administered 2018-02-22 (×5): 10 mg via INTRAVENOUS

## 2018-02-22 MED ORDER — PROPOFOL 10 MG/ML IV BOLUS
INTRAVENOUS | Status: DC | PRN
  Administered 2018-02-22: 120 mg via INTRAVENOUS

## 2018-02-22 MED ORDER — VITAMIN C 500 MG PO TABS
500.0000 mg | ORAL_TABLET | Freq: Every day | ORAL | Status: DC
  Administered 2018-02-22 – 2018-02-24 (×3): 500 mg via ORAL
  Filled 2018-02-22 (×3): qty 1

## 2018-02-22 MED ORDER — SUGAMMADEX SODIUM 200 MG/2ML IV SOLN
INTRAVENOUS | Status: AC
  Filled 2018-02-22: qty 2

## 2018-02-22 MED ORDER — CEFAZOLIN SODIUM-DEXTROSE 2-4 GM/100ML-% IV SOLN
2.0000 g | INTRAVENOUS | Status: AC
  Administered 2018-02-22: 2 g via INTRAVENOUS
  Filled 2018-02-22: qty 100

## 2018-02-22 MED ORDER — ALBUTEROL SULFATE (2.5 MG/3ML) 0.083% IN NEBU: 2.5000 mg | INHALATION_SOLUTION | Freq: Four times a day (QID) | RESPIRATORY_TRACT | Status: DC | PRN

## 2018-02-22 MED ORDER — FENTANYL CITRATE (PF) 250 MCG/5ML IJ SOLN
INTRAMUSCULAR | Status: AC
  Filled 2018-02-22: qty 5

## 2018-02-22 MED ORDER — CITALOPRAM HYDROBROMIDE 40 MG PO TABS
40.0000 mg | ORAL_TABLET | Freq: Every day | ORAL | Status: DC
  Administered 2018-02-22 – 2018-02-23 (×2): 40 mg via ORAL
  Filled 2018-02-22 (×2): qty 1

## 2018-02-22 MED ORDER — GABAPENTIN 300 MG PO CAPS
300.0000 mg | ORAL_CAPSULE | Freq: Three times a day (TID) | ORAL | Status: DC
  Administered 2018-02-22 – 2018-02-24 (×7): 300 mg via ORAL
  Filled 2018-02-22 (×7): qty 1

## 2018-02-22 MED ORDER — SCOPOLAMINE 1 MG/3DAYS TD PT72
MEDICATED_PATCH | TRANSDERMAL | Status: DC | PRN
  Administered 2018-02-22: 1 via TRANSDERMAL

## 2018-02-22 MED ORDER — HYDROMORPHONE HCL 2 MG/ML IJ SOLN
INTRAMUSCULAR | Status: AC
  Filled 2018-02-22: qty 1

## 2018-02-22 MED ORDER — PANTOPRAZOLE SODIUM 40 MG PO TBEC
40.0000 mg | DELAYED_RELEASE_TABLET | Freq: Every day | ORAL | Status: DC
  Administered 2018-02-22 – 2018-02-24 (×3): 40 mg via ORAL
  Filled 2018-02-22 (×3): qty 1

## 2018-02-22 MED ORDER — ONDANSETRON HCL 4 MG/2ML IJ SOLN
INTRAMUSCULAR | Status: AC
  Filled 2018-02-22: qty 2

## 2018-02-22 MED ORDER — POTASSIUM CHLORIDE IN NACL 20-0.9 MEQ/L-% IV SOLN
INTRAVENOUS | Status: DC
  Administered 2018-02-22 – 2018-02-23 (×2): via INTRAVENOUS
  Filled 2018-02-22 (×2): qty 1000

## 2018-02-22 MED ORDER — EPHEDRINE 5 MG/ML INJ
INTRAVENOUS | Status: AC
  Filled 2018-02-22: qty 10

## 2018-02-22 MED ORDER — DEXAMETHASONE SODIUM PHOSPHATE 10 MG/ML IJ SOLN
INTRAMUSCULAR | Status: DC | PRN
  Administered 2018-02-22: 10 mg via INTRAVENOUS

## 2018-02-22 MED ORDER — DIPHENHYDRAMINE HCL 12.5 MG/5ML PO ELIX: 12.5000 mg | ORAL_SOLUTION | ORAL | Status: DC | PRN

## 2018-02-22 MED ORDER — METOCLOPRAMIDE HCL 5 MG/ML IJ SOLN: 5.0000 mg | Freq: Three times a day (TID) | INTRAMUSCULAR | Status: DC | PRN

## 2018-02-22 MED ORDER — LIDOCAINE HCL (CARDIAC) PF 100 MG/5ML IV SOSY
PREFILLED_SYRINGE | INTRAVENOUS | Status: DC | PRN
  Administered 2018-02-22: 100 mg via INTRAVENOUS

## 2018-02-22 MED ORDER — CEFAZOLIN SODIUM-DEXTROSE 1-4 GM/50ML-% IV SOLN
1.0000 g | Freq: Four times a day (QID) | INTRAVENOUS | Status: AC
  Administered 2018-02-22 – 2018-02-23 (×3): 1 g via INTRAVENOUS
  Filled 2018-02-22 (×4): qty 50

## 2018-02-22 MED ORDER — POLYETHYLENE GLYCOL 3350 17 G PO PACK
17.0000 g | PACK | Freq: Every day | ORAL | Status: DC
  Administered 2018-02-22 – 2018-02-23 (×2): 17 g via ORAL
  Filled 2018-02-22 (×3): qty 1

## 2018-02-22 MED ORDER — HYDROMORPHONE HCL 1 MG/ML IJ SOLN
INTRAMUSCULAR | Status: AC
  Filled 2018-02-22: qty 0.5

## 2018-02-22 MED ORDER — HYDROMORPHONE HCL 2 MG/ML IJ SOLN
0.3000 mg | INTRAMUSCULAR | Status: DC | PRN
  Administered 2018-02-22: 0.3 mg via INTRAVENOUS
  Administered 2018-02-22: 0.5 mg via INTRAVENOUS

## 2018-02-22 MED ORDER — CYCLOBENZAPRINE HCL 5 MG PO TABS
5.0000 mg | ORAL_TABLET | Freq: Three times a day (TID) | ORAL | Status: DC | PRN
  Administered 2018-02-23 – 2018-02-24 (×2): 10 mg via ORAL
  Filled 2018-02-22: qty 2
  Filled 2018-02-22: qty 1
  Filled 2018-02-22 (×2): qty 2

## 2018-02-22 MED ORDER — PROMETHAZINE HCL 25 MG/ML IJ SOLN: 6.2500 mg | INTRAMUSCULAR | Status: DC | PRN

## 2018-02-22 MED ORDER — FENTANYL CITRATE (PF) 100 MCG/2ML IJ SOLN
INTRAMUSCULAR | Status: DC | PRN
  Administered 2018-02-22 (×6): 50 ug via INTRAVENOUS
  Administered 2018-02-22: 100 ug via INTRAVENOUS
  Administered 2018-02-22 (×2): 50 ug via INTRAVENOUS

## 2018-02-22 MED ORDER — ALBUTEROL SULFATE HFA 108 (90 BASE) MCG/ACT IN AERS: 2.0000 | INHALATION_SPRAY | Freq: Four times a day (QID) | RESPIRATORY_TRACT | Status: DC | PRN

## 2018-02-22 MED ORDER — CALCIUM CITRATE 950 (200 CA) MG PO TABS
200.0000 mg | ORAL_TABLET | Freq: Two times a day (BID) | ORAL | Status: DC
  Administered 2018-02-22 – 2018-02-24 (×4): 200 mg via ORAL
  Filled 2018-02-22 (×5): qty 1

## 2018-02-22 MED ORDER — PHENYLEPHRINE 40 MCG/ML (10ML) SYRINGE FOR IV PUSH (FOR BLOOD PRESSURE SUPPORT)
PREFILLED_SYRINGE | INTRAVENOUS | Status: AC
  Filled 2018-02-22: qty 10

## 2018-02-22 MED ORDER — SUGAMMADEX SODIUM 200 MG/2ML IV SOLN
INTRAVENOUS | Status: DC | PRN
  Administered 2018-02-22: 200 mg via INTRAVENOUS

## 2018-02-22 MED ORDER — MIDAZOLAM HCL 5 MG/5ML IJ SOLN
INTRAMUSCULAR | Status: DC | PRN
  Administered 2018-02-22: 2 mg via INTRAVENOUS

## 2018-02-22 MED ORDER — OXYCODONE HCL 5 MG PO TABS
5.0000 mg | ORAL_TABLET | ORAL | Status: DC | PRN
  Administered 2018-02-22 – 2018-02-24 (×6): 10 mg via ORAL
  Filled 2018-02-22 (×6): qty 2

## 2018-02-22 MED ORDER — ONDANSETRON HCL 4 MG/2ML IJ SOLN: 4.0000 mg | Freq: Four times a day (QID) | INTRAMUSCULAR | Status: DC | PRN

## 2018-02-22 MED ORDER — HYDROMORPHONE HCL 2 MG/ML IJ SOLN
0.5000 mg | INTRAMUSCULAR | Status: DC | PRN
  Administered 2018-02-23 (×2): 1 mg via INTRAVENOUS
  Filled 2018-02-22 (×2): qty 1

## 2018-02-22 MED ORDER — ALPRAZOLAM 0.5 MG PO TABS
0.5000 mg | ORAL_TABLET | Freq: Every day | ORAL | Status: DC
  Administered 2018-02-22 – 2018-02-23 (×2): 0.5 mg via ORAL
  Filled 2018-02-22 (×2): qty 1

## 2018-02-22 MED ORDER — ALBUMIN HUMAN 5 % IV SOLN
INTRAVENOUS | Status: DC | PRN
  Administered 2018-02-22: 13:00:00 via INTRAVENOUS

## 2018-02-22 MED ORDER — PHENYLEPHRINE HCL 10 MG/ML IJ SOLN
INTRAMUSCULAR | Status: DC | PRN
  Administered 2018-02-22 (×5): 80 ug via INTRAVENOUS

## 2018-02-22 MED ORDER — MONTELUKAST SODIUM 10 MG PO TABS
10.0000 mg | ORAL_TABLET | Freq: Every day | ORAL | Status: DC
  Administered 2018-02-22 – 2018-02-23 (×2): 10 mg via ORAL
  Filled 2018-02-22 (×2): qty 1

## 2018-02-22 MED ORDER — ACETAMINOPHEN 500 MG PO TABS
1000.0000 mg | ORAL_TABLET | Freq: Once | ORAL | Status: AC
  Administered 2018-02-22: 1000 mg via ORAL
  Filled 2018-02-22: qty 2

## 2018-02-22 MED ORDER — VITAMIN D 1000 UNITS PO TABS
5000.0000 [IU] | ORAL_TABLET | Freq: Every day | ORAL | Status: DC
  Administered 2018-02-22 – 2018-02-24 (×3): 5000 [IU] via ORAL
  Filled 2018-02-22 (×3): qty 5

## 2018-02-22 MED ORDER — MAGNESIUM CITRATE PO SOLN: 1.0000 | Freq: Once | ORAL | Status: DC | PRN

## 2018-02-22 MED ORDER — LIDOCAINE 2% (20 MG/ML) 5 ML SYRINGE
INTRAMUSCULAR | Status: AC
  Filled 2018-02-22: qty 5

## 2018-02-22 MED ORDER — OXYCODONE-ACETAMINOPHEN 7.5-325 MG PO TABS
1.0000 | ORAL_TABLET | Freq: Four times a day (QID) | ORAL | Status: DC | PRN
  Administered 2018-02-23 – 2018-02-24 (×2): 2 via ORAL
  Filled 2018-02-22 (×2): qty 2

## 2018-02-22 MED ORDER — KETOROLAC TROMETHAMINE 30 MG/ML IJ SOLN: 30.0000 mg | Freq: Once | INTRAMUSCULAR | Status: DC | PRN

## 2018-02-22 MED ORDER — LACTATED RINGERS IV SOLN
INTRAVENOUS | Status: DC
  Administered 2018-02-22 (×3): via INTRAVENOUS

## 2018-02-22 MED ORDER — HYDROMORPHONE HCL 1 MG/ML IJ SOLN
INTRAMUSCULAR | Status: DC | PRN
  Administered 2018-02-22: 0.5 mg via INTRAVENOUS

## 2018-02-22 MED ORDER — PROPOFOL 10 MG/ML IV BOLUS
INTRAVENOUS | Status: AC
  Filled 2018-02-22: qty 40

## 2018-02-22 MED ORDER — DOCUSATE SODIUM 100 MG PO CAPS
100.0000 mg | ORAL_CAPSULE | Freq: Two times a day (BID) | ORAL | Status: DC
  Administered 2018-02-22 – 2018-02-24 (×4): 100 mg via ORAL
  Filled 2018-02-22 (×4): qty 1

## 2018-02-22 MED ORDER — 0.9 % SODIUM CHLORIDE (POUR BTL) OPTIME
TOPICAL | Status: DC | PRN
  Administered 2018-02-22 (×2): 1000 mL

## 2018-02-22 MED ORDER — SCOPOLAMINE 1 MG/3DAYS TD PT72
MEDICATED_PATCH | TRANSDERMAL | Status: AC
  Filled 2018-02-22: qty 1

## 2018-02-22 MED ORDER — ONDANSETRON HCL 4 MG PO TABS: 4.0000 mg | ORAL_TABLET | Freq: Four times a day (QID) | ORAL | Status: DC | PRN

## 2018-02-22 MED ORDER — ROCURONIUM BROMIDE 100 MG/10ML IV SOLN
INTRAVENOUS | Status: DC | PRN
  Administered 2018-02-22: 40 mg via INTRAVENOUS
  Administered 2018-02-22 (×3): 20 mg via INTRAVENOUS

## 2018-02-22 MED ORDER — ENOXAPARIN SODIUM 40 MG/0.4ML ~~LOC~~ SOLN
40.0000 mg | SUBCUTANEOUS | Status: DC
  Administered 2018-02-23 – 2018-02-24 (×2): 40 mg via SUBCUTANEOUS
  Filled 2018-02-22 (×2): qty 0.4

## 2018-02-22 MED ORDER — HYDROMORPHONE HCL 1 MG/ML IJ SOLN: 0.5000 mg | INTRAMUSCULAR | Status: DC | PRN

## 2018-02-22 MED ORDER — DEXAMETHASONE SODIUM PHOSPHATE 10 MG/ML IJ SOLN
INTRAMUSCULAR | Status: AC
  Filled 2018-02-22: qty 1

## 2018-02-22 MED ORDER — ONDANSETRON HCL 4 MG/2ML IJ SOLN
INTRAMUSCULAR | Status: DC | PRN
  Administered 2018-02-22: 4 mg via INTRAVENOUS

## 2018-02-22 MED ORDER — MEPERIDINE HCL 50 MG/ML IJ SOLN: 6.2500 mg | INTRAMUSCULAR | Status: DC | PRN

## 2018-02-22 MED ORDER — MIDAZOLAM HCL 2 MG/2ML IJ SOLN
INTRAMUSCULAR | Status: AC
Start: 2018-02-22 — End: ?
  Filled 2018-02-22: qty 2

## 2018-02-22 MED ORDER — METOCLOPRAMIDE HCL 5 MG PO TABS: 5.0000 mg | ORAL_TABLET | Freq: Three times a day (TID) | ORAL | Status: DC | PRN

## 2018-02-22 SURGICAL SUPPLY — 105 items
ASMB TUBE 520 STRL RMR IRR (MISCELLANEOUS) ×3
BANDAGE ACE 4X5 VEL STRL LF (GAUZE/BANDAGES/DRESSINGS) ×5 IMPLANT
BANDAGE ACE 6X5 VEL STRL LF (GAUZE/BANDAGES/DRESSINGS) ×5 IMPLANT
BIT DRILL 110X2.5XQCK CNCT (BIT) IMPLANT
BIT DRILL 2.5 (BIT) ×5
BIT DRILL 3.8X6 NS (BIT) ×2 IMPLANT
BIT DRILL 4.4 NS (BIT) ×2 IMPLANT
BIT DRILL STD 2.0MM (DRILL) IMPLANT
BIT DRL 110X2.5XQCK CNCT (BIT) ×3
BLADE SURG 10 STRL SS (BLADE) ×9 IMPLANT
BNDG COHESIVE 6X5 TAN STRL LF (GAUZE/BANDAGES/DRESSINGS) ×2 IMPLANT
BNDG GAUZE ELAST 4 BULKY (GAUZE/BANDAGES/DRESSINGS) ×8 IMPLANT
BRUSH SCRUB SURG 4.25 DISP (MISCELLANEOUS) ×10 IMPLANT
BUR STRYKR EGG 5.0 (BURR) ×2 IMPLANT
CLIP LOCKING FOR RIA (CLIP) ×4 IMPLANT
COVER SURGICAL LIGHT HANDLE (MISCELLANEOUS) ×8 IMPLANT
DRAPE C-ARM 42X72 X-RAY (DRAPES) ×5 IMPLANT
DRAPE C-ARMOR (DRAPES) ×5 IMPLANT
DRAPE HALF SHEET 40X57 (DRAPES) ×6 IMPLANT
DRAPE INCISE IOBAN 66X45 STRL (DRAPES) ×12 IMPLANT
DRAPE SURG 17X23 STRL (DRAPES) ×5 IMPLANT
DRAPE U-SHAPE 47X51 STRL (DRAPES) ×5 IMPLANT
DRESSING PREVENA PLUS CUSTOM (GAUZE/BANDAGES/DRESSINGS) IMPLANT
DRILL STANDARD 2.0MM (DRILL) ×5
DRIVE SHAFT SEAL STERILE ×2 IMPLANT
DRSG ADAPTIC 3X8 NADH LF (GAUZE/BANDAGES/DRESSINGS) ×5 IMPLANT
DRSG MEPILEX BORDER 4X4 (GAUZE/BANDAGES/DRESSINGS) ×2 IMPLANT
DRSG MEPITEL 4X7.2 (GAUZE/BANDAGES/DRESSINGS) ×5 IMPLANT
DRSG PAD ABDOMINAL 8X10 ST (GAUZE/BANDAGES/DRESSINGS) ×4 IMPLANT
DRSG PREVENA PLUS CUSTOM (GAUZE/BANDAGES/DRESSINGS) ×5
ELECT REM PT RETURN 9FT ADLT (ELECTROSURGICAL) ×5
ELECTRODE REM PT RTRN 9FT ADLT (ELECTROSURGICAL) ×3 IMPLANT
GAUZE SPONGE 4X4 12PLY STRL (GAUZE/BANDAGES/DRESSINGS) ×5 IMPLANT
GLOVE BIOGEL PI IND STRL 7.5 (GLOVE) ×3 IMPLANT
GLOVE BIOGEL PI IND STRL 8 (GLOVE) ×3 IMPLANT
GLOVE BIOGEL PI INDICATOR 7.5 (GLOVE) ×2
GLOVE BIOGEL PI INDICATOR 8 (GLOVE) ×2
GLOVE SURG SS PI 6.5 STRL IVOR (GLOVE) ×6 IMPLANT
GLOVE SURG SS PI 7.5 STRL IVOR (GLOVE) ×10 IMPLANT
GLOVE SURG SS PI 8.0 STRL IVOR (GLOVE) ×10 IMPLANT
GOWN EXTRA PROTECTION XXL 0583 (GOWNS) ×2 IMPLANT
GOWN STRL REUS W/ TWL LRG LVL3 (GOWN DISPOSABLE) ×6 IMPLANT
GOWN STRL REUS W/ TWL XL LVL3 (GOWN DISPOSABLE) ×3 IMPLANT
GOWN STRL REUS W/TWL LRG LVL3 (GOWN DISPOSABLE) ×10
GOWN STRL REUS W/TWL XL LVL3 (GOWN DISPOSABLE) ×5
GRAFT FILTER FOR RIA 520 LGTH (MISCELLANEOUS) ×2 IMPLANT
GUIDEWIRE 3.2X400 (WIRE) ×2 IMPLANT
GUIDEWIRE BALL NOSE 2.0MM (WIRE) ×2 IMPLANT
GUIDEWIRE BALL NOSE 80CM (WIRE) ×2 IMPLANT
HEAD REAMER 12MM (Head) ×2 IMPLANT
K-WIRE 1.25 PACK OF 10 (Wire) ×5 IMPLANT
KIT BASIN OR (CUSTOM PROCEDURE TRAY) ×5 IMPLANT
KIT DRSG PREVENA PLUS 7DAY 125 (MISCELLANEOUS) ×2 IMPLANT
KIT TURNOVER KIT B (KITS) ×5 IMPLANT
KWIRE 1.25 PACK OF 10 (Wire) IMPLANT
MANIFOLD NEPTUNE II (INSTRUMENTS) ×5 IMPLANT
NAIL TIBIAL 8MMX31.5CM (Nail) ×2 IMPLANT
NS IRRIG 1000ML POUR BTL (IV SOLUTION) ×5 IMPLANT
PACK ORTHO EXTREMITY (CUSTOM PROCEDURE TRAY) ×5 IMPLANT
PAD ABD 8X10 STRL (GAUZE/BANDAGES/DRESSINGS) ×10 IMPLANT
PAD ARMBOARD 7.5X6 YLW CONV (MISCELLANEOUS) ×10 IMPLANT
PAD CAST 4YDX4 CTTN HI CHSV (CAST SUPPLIES) IMPLANT
PADDING CAST COTTON 4X4 STRL (CAST SUPPLIES) ×5
PADDING CAST COTTON 6X4 STRL (CAST SUPPLIES) ×11 IMPLANT
PLATE LOCKING 8H FIBULA LEFT (Plate) ×2 IMPLANT
REAMER ROD DEEP FLUTE 2.5X950 (INSTRUMENTS) ×2 IMPLANT
SCREW 3.5X16MM (Screw) ×5 IMPLANT
SCREW ACECAP 40MM (Screw) ×2 IMPLANT
SCREW ACECAP 42MM (Screw) ×2 IMPLANT
SCREW ACECAP 46MM (Screw) ×2 IMPLANT
SCREW BN 2.7X16X3.5XST NS (Screw) IMPLANT
SCREW LOCK 16X3.5X2.7X NS (Screw) IMPLANT
SCREW LOCKING 2.7MMX20MM (Screw) ×2 IMPLANT
SCREW LOCKING 2.7MMX22MM (Screw) ×4 IMPLANT
SCREW LOCKING 2.7X12 (Screw) ×2 IMPLANT
SCREW LOCKING 3.5X16MM (Screw) ×5 IMPLANT
SCREW PERI 3.5X14MM W/2.7 (Screw) ×2 IMPLANT
SCREW PROXIMAL DEPUY (Screw) ×10 IMPLANT
SCREW PRXML FT 55X5.5XNS TIB (Screw) IMPLANT
SET CYSTO W/LG BORE CLAMP LF (SET/KITS/TRAYS/PACK) ×2 IMPLANT
SPONGE LAP 18X18 X RAY DECT (DISPOSABLE) ×9 IMPLANT
STAPLER VISISTAT 35W (STAPLE) ×5 IMPLANT
SUCTION FRAZIER TIP 10 FR DISP (SUCTIONS) ×2 IMPLANT
SUT ETHILON 1 LR 30 (SUTURE) ×2 IMPLANT
SUT ETHILON 2 0 FS 18 (SUTURE) ×2 IMPLANT
SUT ETHILON 2 0 PSLX (SUTURE) ×6 IMPLANT
SUT ETHILON 3 0 PS 1 (SUTURE) ×12 IMPLANT
SUT ETHILON O TP 1 (SUTURE) ×2 IMPLANT
SUT PDS AB 2-0 CT1 27 (SUTURE) ×2 IMPLANT
SUT VIC AB 0 CT1 27 (SUTURE) ×10
SUT VIC AB 0 CT1 27XBRD ANBCTR (SUTURE) ×3 IMPLANT
SUT VIC AB 2-0 CT1 27 (SUTURE) ×10
SUT VIC AB 2-0 CT1 TAPERPNT 27 (SUTURE) ×3 IMPLANT
SWAB COLLECTION DEVICE MRSA (MISCELLANEOUS) ×2 IMPLANT
SWAB CULTURE ESWAB REG 1ML (MISCELLANEOUS) ×2 IMPLANT
TOWEL GREEN STERILE (TOWEL DISPOSABLE) ×10 IMPLANT
TOWEL OR 17X24 6PK STRL BLUE (TOWEL DISPOSABLE) ×12 IMPLANT
TOWEL OR 17X26 10 PK STRL BLUE (TOWEL DISPOSABLE) ×12 IMPLANT
TUBE ASSEMBLY RIA STERILE (MISCELLANEOUS) ×2 IMPLANT
TUBE CONNECTING 12'X1/4 (SUCTIONS) ×1
TUBE CONNECTING 12X1/4 (SUCTIONS) ×1 IMPLANT
TUBING CYSTO DISP (UROLOGICAL SUPPLIES) ×2 IMPLANT
UNDERPAD 30X30 (UNDERPADS AND DIAPERS) ×5 IMPLANT
WATER STERILE IRR 1000ML POUR (IV SOLUTION) ×10 IMPLANT
YANKAUER SUCT BULB TIP NO VENT (SUCTIONS) ×2 IMPLANT

## 2018-02-22 NOTE — Anesthesia Postprocedure Evaluation (Signed)
Anesthesia Post Note  Patient: [name] Carr  Procedure(s) Performed: INTRAMEDULLARY (IM) NAIL LEFT TIBIAL (Left ) REMOVAL EXTERNAL FIXATION LEFT LEG (N/A ) RIA HARVEST RIGHT FEMUR (Right )     Patient location during evaluation: PACU Anesthesia Type: General Level of consciousness: awake Pain management: pain level controlled Vital Signs Assessment: post-procedure vital signs reviewed and stable Respiratory status: spontaneous breathing Cardiovascular status: stable Postop Assessment: no apparent nausea or vomiting Anesthetic complications: no    Last Vitals:  Vitals:   02/22/18 1530 02/22/18 1534  BP:  125/73  Pulse: (!) 108 (!) 103  Resp: 19 17  Temp:    SpO2: 97% 96%    Last Pain:  Vitals:   02/22/18 1530  TempSrc:   PainSc: 5    Pain Goal: Patients Stated Pain Goal: 2 (02/22/18 0651)               Norlan Rann JR,JOHN Mateo Flow

## 2018-02-22 NOTE — Anesthesia Procedure Notes (Addendum)
Procedure Name: Intubation Date/Time: 02/22/2018 8:20 AM Performed by: Scheryl Darter, CRNA Pre-anesthesia Checklist: Patient identified, Emergency Drugs available, Suction available and Patient being monitored Patient Re-evaluated:Patient Re-evaluated prior to induction Oxygen Delivery Method: Circle System Utilized Preoxygenation: Pre-oxygenation with 100% oxygen Induction Type: IV induction Ventilation: Mask ventilation without difficulty Laryngoscope Size: Miller and 2 Grade View: Grade I Tube type: Oral Tube size: 7.5 mm Number of attempts: 1 Airway Equipment and Method: Stylet and Oral airway Placement Confirmation: ETT inserted through vocal cords under direct vision,  positive ETCO2 and breath sounds checked- equal and bilateral Secured at: 22 cm Tube secured with: Tape Dental Injury: Teeth and Oropharynx as per pre-operative assessment

## 2018-02-22 NOTE — Transfer of Care (Signed)
Immediate Anesthesia Transfer of Care Note  Patient: [name] Carr  Procedure(s) Performed: INTRAMEDULLARY (IM) NAIL LEFT TIBIAL (Left ) REMOVAL EXTERNAL FIXATION LEFT LEG (N/A ) RIA HARVEST RIGHT FEMUR (Right )  Patient Location: PACU  Anesthesia Type:General  Level of Consciousness: awake and patient cooperative  Airway & Oxygen Therapy: Patient Spontanous Breathing and Patient connected to face mask oxygen  Post-op Assessment: Report given to RN and Post -op Vital signs reviewed and stable  Post vital signs: Reviewed and stable  Last Vitals:  Vitals Value Taken Time  BP 91/51 02/22/2018  2:49 PM  Temp    Pulse 108 02/22/2018  2:52 PM  Resp 14 02/22/2018  2:52 PM  SpO2 99 % 02/22/2018  2:52 PM  Vitals shown include unvalidated device data.  Last Pain:  Vitals:   02/22/18 4917  TempSrc: Oral      Patients Stated Pain Goal: 2 (91/50/56 9794)  Complications: No apparent anesthesia complications

## 2018-02-22 NOTE — Brief Op Note (Signed)
02/22/2018  2:04 PM  PATIENT:  [name] Carr  47 y.o. female  PRE-OPERATIVE DIAGNOSIS:   1. LEFT TIBIA AND FIBULA NONUNIONS 2. CEMENT SPACERS 3. RETAINED EXTERNAL FIXATOR 4. TIBIAL AND FIBULA INTRAMEDULLARY RODS  POST-OPERATIVE DIAGNOSIS:   1. LEFT TIBIA AND FIBULA NONUNIONS 2. CEMENT SPACERS 3. RETAINED EXTERNAL FIXATOR 4. TIBIAL AND FIBULA INTRAMEDULLARY RODS  PROCEDURE:  Procedure(s): 1. INTRAMEDULLARY (IM) NAIL LEFT TIBIAL (Left) WITH BIOMET VERSANAIL 8 X 315 mm statically locked nail 2. REMOVAL EXTERNAL FIXATION LEFT LEG (N/A) 3. REPAIR OF LEFT TIBIA NONUNION WITH AUTOGRAFT (RIA HARVEST RIGHT FEMUR (Right)) 4. REPAIR OF LEFT FIBULA NONUNION WITH AUTOGRAFT (RIA HARVEST RIGHT FEMUR (Right)) 5. REMOVAL OF CEMENT SPACERS TIBIA AND FIBULA 6. APPLICATION OF SMALL WOUND VAC 7. REMOVAL OF DEEP IMPLANT LEFT TIBIA AND FIBULA  SURGEON:  Surgeon(s) and Role:    Altamese [place], MD - Primary  PHYSICIAN ASSISTANT: Ainsley Spinner, PA-C  ANESTHESIA:   general  EBL:  300 mL   BLOOD ADMINISTERED:none  DRAINS: none   LOCAL MEDICATIONS USED:  NONE  SPECIMEN:  Source of Specimen:  reamings left tibia  DISPOSITION OF SPECIMEN:  micro  COUNTS:  YES  TOURNIQUET:  * No tourniquets in log *  DICTATION: .Other Dictation: Dictation Number [id number]  PLAN OF CARE: Admit to inpatient   PATIENT DISPOSITION:  PACU - hemodynamically stable.   Delay start of Pharmacological VTE agent (>24hrs) due to surgical blood loss or risk of bleeding: no

## 2018-02-23 ENCOUNTER — Encounter (HOSPITAL_COMMUNITY): Payer: Self-pay | Admitting: General Practice

## 2018-02-23 ENCOUNTER — Other Ambulatory Visit: Payer: Self-pay

## 2018-02-23 LAB — CBC
HEMATOCRIT: 26.2 % — AB (ref 36.0–46.0)
Hemoglobin: 8.2 g/dL — ABNORMAL LOW (ref 12.0–15.0)
MCH: 26.9 pg (ref 26.0–34.0)
MCHC: 31.3 g/dL (ref 30.0–36.0)
MCV: 85.9 fL (ref 78.0–100.0)
Platelets: 471 10*3/uL — ABNORMAL HIGH (ref 150–400)
RBC: 3.05 MIL/uL — AB (ref 3.87–5.11)
RDW: 15.4 % (ref 11.5–15.5)
WBC: 12.3 10*3/uL — AB (ref 4.0–10.5)

## 2018-02-23 LAB — BASIC METABOLIC PANEL
ANION GAP: 10 (ref 5–15)
BUN: <5 mg/dL — ABNORMAL LOW (ref 6–20)
CO2: 27 mmol/L (ref 22–32)
Calcium: 8.8 mg/dL — ABNORMAL LOW (ref 8.9–10.3)
Chloride: 101 mmol/L (ref 101–111)
Creatinine, Ser: 0.65 mg/dL (ref 0.44–1.00)
Glucose, Bld: 148 mg/dL — ABNORMAL HIGH (ref 65–99)
POTASSIUM: 3.8 mmol/L (ref 3.5–5.1)
Sodium: 138 mmol/L (ref 135–145)

## 2018-02-23 LAB — VITAMIN D 25 HYDROXY (VIT D DEFICIENCY, FRACTURES): VIT D 25 HYDROXY: 60.8 ng/mL (ref 30.0–100.0)

## 2018-02-23 NOTE — Progress Notes (Signed)
Orthopedic Tech Progress Note Patient Details:  [name] Carr 01/28/1971 [id number]  Ortho Devices Type of Ortho Device: CAM walker Ortho Device/Splint Location: lle Ortho Device/Splint Interventions: Application   Post Interventions Patient Tolerated: Well Instructions Provided: Care of device   Hildred Priest 02/23/2018, 10:44 AM

## 2018-02-23 NOTE — Op Note (Addendum)
NAMENIASIA, LANPHEAR              ACCOUNT NO.:  [id number]  MEDICAL RECORD NO.:  [id number]  LOCATION:  [facility]                         FACILITY:  [facility]  PHYSICIAN:  Astrid Divine. Marcelino Scot, M.D. DATE OF BIRTH:  [birth year]  DATE OF PROCEDURE:  02/22/2018 DATE OF DISCHARGE:                              OPERATIVE REPORT   PREOPERATIVE DIAGNOSES: 1. Left tibia and fibula nonunions. 2. Retained cement antibiotic spacers. 3. Retained external fixator with ulcerated pin sites. 4. Tibia and fibula intramedullary rods.  POSTOPERATIVE DIAGNOSES: 1. Left tibia and fibula nonunions. 2. Retained cement antibiotic spacers. 3. Retained external fixator with ulcerated pin sites. 4. Tibia and fibula intramedullary rods.  PROCEDURES: 1. Intramedullary nailing of the left tibia with a Biomet VersaNail 8     x 315 mm statically locked. 2. Removal of external fixator under anesthesia, left ankle. 3. Repair of left tibia nonunion using reamed intramedullary autograft     from the right femur. 4. Repair of left fibular nonunion with autograft. 5. Removal of cement spacers, tibia and fibula. 6. Retention suture closure 20 cm with application of small incisional wound VAC. 7. Removal of deep implant, left tibia and fibula. 8. Curettage of ulcerated pin sites, tibia and calcaneus.  SURGEON:  Astrid Divine. Marcelino Scot, M.D.  ASSISTANT:  Ainsley Spinner, PA-C.  ANESTHESIA:  General.  COMPLICATIONS:  None.  ESTIMATED BLOOD LOSS:  300 mL.  SPECIMEN:  Two anaerobic and aerobic reamings from the left tibia sent to Micro.  TOURNIQUET:  None.  DISPOSITION:  To PACU.  CONDITION:  Stable.  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  Ultimately, because of the complexity of her case, she came under my care as a musculoskeletal  traumatologist.  She underwent staged treatment for potential indolent infection versus atrophic nonunion with intramedullary rodding combined with a cement spacer as well as external fixation of the left leg.  She now presents for removal of these stabilization devices and definitive treatment with intramedullary nailing and reamed intramedullary autograft from the right femur to the tibia and fibula nonunions.  Our hope was to avoid additional plating of the fibula.  I did discuss with her preoperatively the risks and benefits of this procedure including the possibility of infection, nerve injury, vessel injury, DVT, PE, persistent nonunion, recurrent infection, need for further surgery, loss of motion, and others.  We also discussed the potential need to plate the fibula.  She acknowledged all these risks and did wish to proceed.  BRIEF SUMMARY OF PROCEDURE:  The patient was given 2 g of Ancef preoperatively and taken to the operating room where general anesthesia was induced.  I then removed her external fixator. Her left lower extremity was prepped and draped in usual sterile fashion as was her right so that we could have the right femur available for harvesting autograft. A additional drape was used at first, allowing [place] to serial debride with curettage the pin tracts in the tibia and calcaneus where ulcerations of the pin sites had occurred. Beginning with the dermis, progressing through  the subcutaneous tissue, fascia, and bone cortices, we used curettes to remove all devitalized and contaminated tissue. Thorough irrigation was then performed, including flushing irrigation through and through the calcaneus, and then these pin site areas were dressed with lap sponges and I[place] to protect them from the field. We began with the left leg, bringing in C-arm, identifying the area of the cement spacer.  We incised the skin after time-out and identified the interval between the overlying soft  tissues and the induced membrane around the cement spacer.  This was incised longitudinally and then the cement spacer shelled out.  I was able to mobilize the intramedullary rod proximally and then remove this deep implant without damage to the adjacent structures.  I maintained the fibular rod and cement spacer at this time as I did not wish to interrupt or damage the membrane that we had worked to establish around the tibia.  I then turned my attention proximally where a 2.5-cm incision was made over the base of the patella.  Medial parapatellar incision was made at the paratenon and then a curved cannulated awl advanced just medial to the lateral tibial spine and the center-center position of the proximal tibia on AP and lateral views. This was followed by placement of a ball-tipped guidewire across the fracture site into the center of the plafond.  Once this was in place, we then began with the starting reamer, but we were unable to get past the cap of bone that had formed over the proximal tibial fragment at the distal end.  We tried a variety of measures including using a smaller gauge reamer with a smaller gauge guidewire, but all of these were unsuccessful.  I was then able to use a 4.5-mm drill from the opened wound drilling retrograde to break up the cap.  This was followed by use of the larger curette spinning within this space.  We then went back to reaming over the small guidewire and by so doing, we were able to eventually get the 6-mm reamer to pass and then the 7 and 8.  We did not encounter chatter in the rest of the tibia.  I did send some of these reamings for anaerobic and aerobic culture.  We then changed out guide rods and reamed sequentially over that up until 9 mm.  I chose to place the smallest caliber rod so as to avoid excessive reaming of the intramedullary blood supply and to preserve this as well as to potentially perform an exchange nail in the future should  this fail to heal without the need for formally opening the fracture site through the soft tissue envelope.  I was careful to hold the reduction throughout while my assistant reamed.  In particular, I was trying to eliminate some of the excessive valgus that had been present since index surgery and was present with the spacers and fixators despite efforts to control it more effectively.  I then placed the nail while again holding the reduction.  This was followed by placement of the distal locks using perfect circle technique.  Proximal locks were placed off the guide. Despite all efforts to hold appropriate alignment, this was insufficient and controlling it and consequently it was deemed necessary to plate the fibula.  I remade the lateral incision.  I continued dissection down to the bone distally and proximally, but identified and preserved the membrane that had formed around the fibular cement spacer.  I incised this longitudinally, shelled out the spacer.  I  used a wire cutter to cut the intramedullary rod and then withdrew it from both proximal and distal. This was then followed by irrigation, placement of a lap sponge in this incision as I did in the tibia.  My assistant and I then turned attention to the right femur.  He held the hip in slight flexion and adduction.  I brought in the C-arm, identified appropriate starting point at the greater trochanter where the curved cannulated awl was inserted to this area, the threaded guidewire then into the center of the proximal femur followed by initiation of a reaming canal with the starting reamer.  Bone was collected from the starting reamer for use as autograft.  Ball-tip guidewire was advanced into the center, but posterior portion of the femur to avoid injuring the anterior cortex.  We measured the femur 12 mm, which had been used previously for real on the contralateral side. We then engaged the reamed intramedullary aspirator  with the saline and suction, generating good 60 mL of healthy high-quality graft.  This wound was irrigated and then closed with 0 Vicryl, 2-0, Vicryl and 2-0 nylon.  The graft that was collected on the back was used partially to place within the membrane of the fibula.  This was sutured using a 2-0 PDS baseball-type stitch around the membrane with excellent fit and fill.  A plate was then placed distally and secured and with distraction laterally and my assistant producing as much varus as he could, screws were placed into the proximal shaft segment which greatly improved the overall alignment of the tibia.  The tibial distal locking bolts had been removed at this point to improve the angulation and after complete repair of the fibula, I also applied a tenaculum within the wound on the medial aspect of the tibia into one of the heads of the screws on the fibula to further improve the overall alignment distally, leaving it just a touch of valgus which should help [place] physiologically with union. After first placing an anterior to posterior screw while holding this improved alignment distally once the AP screw was placed, the medial to lateral screws were re-drilled on the medial side with the use of the old hole in the lateral.  This greatly improved the overall position and alignment.  Lastly, the autograft was placed within this large cavity in the pseudocapsule repaired with interrupted #2 PDS sutures.  We had outstanding fill and apposition all around the ends of the bone proximally and distally with this very high quality graft and there was no room or need for additional allograft supplementation.  Wounds were irrigated thoroughly once more, closed in a standard layered fashion with a combination of PDS, Vicryl, and 2-0 nylon. Retention suture closure was required on both the tibia and the fibula with combined length of greater than 20 cm given her multiple previous surgeries, which  extended operative time and the attendant risk of soft tissue complications considerably. An incisional wound VAC and sterile gently compressive dressing was applied and then a posterior and stirrup splint.  The patient was awakened from anesthesia and transported to PACU in stable condition.  Ainsley Spinner, PA-C, did assist me throughout and his assistance was absolutely necessary for this very complex and demanding case at all portions, including working simultaneously on wound closure.  PROGNOSIS:  Ms. Schmiesing will be nonweightbearing in a splint and the wound VAC this week.  When she will return to the office, we will remove it, to transition her into  a cam boot and allow for early range of motion of the knee and ankle.  She will be on formal pharmacologic DVT prophylaxis with Lovenox.  She is at increased risk of persistent nonunion, delayed union because of her greater than year history of nonunion, but we are optimistic given the quality of fixation graft with improvement in her alignment.  We will follow up on her cultures as well to make sure there was no residual evidence of infection.     Astrid Divine. Marcelino Scot, M.D.     MHH/MEDQ  D:  02/22/2018  T:  02/23/2018  Job:  [id number]

## 2018-02-23 NOTE — Progress Notes (Signed)
Orthopedic Trauma Service Progress Note   Patient ID: [name] Carr MRN: [id number] DOB/AGE: 07-25-1971 47 y.o.  Subjective:  Doing well Complains more of L knee pain than anything else.  R hip/femur is sore but very tolerable   No other issues of note    Review of Systems  Constitutional: Negative for chills and fever.  Respiratory: Negative for shortness of breath.   Cardiovascular: Negative for chest pain and palpitations.  Gastrointestinal: Negative for abdominal pain, nausea and vomiting.  Neurological: Negative for tingling and sensory change.    Objective:   VITALS:   Vitals:   02/22/18 1742 02/22/18 1957 02/23/18 0004 02/23/18 0408  BP: (!) 114/56 113/74 117/66 109/67  Pulse: (!) 101 98 88 95  Resp: 16 16 16 16   Temp: 97.9 F (36.6 C) 98.1 F (36.7 C) 98.3 F (36.8 C) 98.2 F (36.8 C)  TempSrc: Oral Oral Oral Oral  SpO2:  99% 98%   Weight:        Estimated body mass index is 29.54 kg/m as calculated from the following:   Height as of 02/16/18: 5\' 6"  (1.676 m).   Weight as of this encounter: 83 kg (183 lb).   Intake/Output      04/30 0701 - 05/01 0700 05/01 0701 - 05/02 0700   P.O. 360    I.V. (mL/kg) 2261.3 (27.2)    IV Piggyback 300    Total Intake(mL/kg) 2921.3 (35.2)    Urine (mL/kg/hr) 2650 (1.3)    Blood 300    Total Output 2950    Net -28.8           LABS  Results for orders placed or performed during the hospital encounter of 02/22/18 (from the past 24 hour(s))  Aerobic/Anaerobic Culture (surgical/deep wound)     Status: None (Preliminary result)   Collection Time: 02/22/18 10:45 AM  Result Value Ref Range   Specimen Description LEG LEFT REAMINGS OF TIBIA    Special Requests ON SWABS    Gram Stain      NO WBC SEEN NO ORGANISMS SEEN Performed at [facility], [place] [address], [place], [place] [address]    Culture PENDING    Report Status PENDING   CBC     Status: Abnormal   Collection Time:  02/22/18  6:37 PM  Result Value Ref Range   WBC 16.8 (H) 4.0 - 10.5 K/uL   RBC 3.31 (L) 3.87 - 5.11 MIL/uL   Hemoglobin 8.9 (L) 12.0 - 15.0 g/dL   HCT 28.4 (L) 36.0 - 46.0 %   MCV 85.8 78.0 - 100.0 fL   MCH 26.9 26.0 - 34.0 pg   MCHC 31.3 30.0 - 36.0 g/dL   RDW 15.2 11.5 - 15.5 %   Platelets 460 (H) 150 - 400 K/uL  Creatinine, serum     Status: None   Collection Time: 02/22/18  6:37 PM  Result Value Ref Range   Creatinine, Ser 0.83 0.44 - 1.00 mg/dL   GFR calc non Af Amer >60 >60 mL/min   GFR calc Af Amer >60 >60 mL/min  Basic metabolic panel     Status: Abnormal   Collection Time: 02/23/18  4:31 AM  Result Value Ref Range   Sodium 138 135 - 145 mmol/L   Potassium 3.8 3.5 - 5.1 mmol/L   Chloride 101 101 - 111 mmol/L   CO2 27 22 - 32 mmol/L   Glucose, Bld 148 (H) 65 - 99 mg/dL   BUN <5 (L)  6 - 20 mg/dL   Creatinine, Ser 0.65 0.44 - 1.00 mg/dL   Calcium 8.8 (L) 8.9 - 10.3 mg/dL   GFR calc non Af Amer >60 >60 mL/min   GFR calc Af Amer >60 >60 mL/min   Anion gap 10 5 - 15  CBC     Status: Abnormal   Collection Time: 02/23/18  4:31 AM  Result Value Ref Range   WBC 12.3 (H) 4.0 - 10.5 K/uL   RBC 3.05 (L) 3.87 - 5.11 MIL/uL   Hemoglobin 8.2 (L) 12.0 - 15.0 g/dL   HCT 26.2 (L) 36.0 - 46.0 %   MCV 85.9 78.0 - 100.0 fL   MCH 26.9 26.0 - 34.0 pg   MCHC 31.3 30.0 - 36.0 g/dL   RDW 15.4 11.5 - 15.5 %   Platelets 471 (H) 150 - 400 K/uL     PHYSICAL EXAM:   Gen: awake and alert, NAD, appears well Lungs: breathing unlabored  Cardiac: Regular  Ext:     Right Lower Extremity        Dressing stable       Motor and sensory functions intact        No acute findings noted        Ext warm    Left Lower Extremity         Splint fitting well        prevena functioning         EHL, FHL, lesser toe motor intact        DPN, SPN, TN sensation intact        Ext warm                    Swelling controlled   Assessment/Plan: 1 Day Post-Op   Active Problems:   Vitamin D  deficiency   Anxiety   Asthma   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 02/22/18 9562 02/22/18 0810    .  POD/HD#: 1  47 y/o female s/p repair or L distal tibia and fibula nonunion   -L distal tibia and fibula nonunion s/p IMN and grafting with RIA  NWB x 6-8 weeks  Will leave VAC on for 7 days   Follow up in 1 week for splint and vac removal  Soft tissue envelope very tenuous   PT/OT evals  Unrestricted ROM L knee   No pillows under bend of knee at rest   - Pain management:  Percocet, oxy IR, flexeril  - ABL anemia/Hemodynamics  Stable   Cbc in am   - Medical issues   Asthma and anxiety    Home meds  - DVT/PE prophylaxis:  Lovenox x 21 days   - ID:   periop abx to be completed today   No organisms noted on gram stain from intra-op specimen   - Metabolic Bone Disease:  Vitamin d deficiency    Continue supplementation    Follow up lab pending   - Activity:  OOB as  tolerated   NWB L leg  - FEN/GI prophylaxis/Foley/Lines:  Reg diet  Dc foley   NSL IVF  -Ex-fix/Splint care:  Keep splint clean and dry    - Dispo:  Dc foley  Therapy evals  Likely home tomorrow     Jari Pigg, PA-C [facility] [phone] Levi Aland (C) 02/23/2018, 9:42 AM

## 2018-02-23 NOTE — Evaluation (Signed)
Physical Therapy Evaluation Patient Details Name: [name] Carr MRN: [id number] DOB: [birth year] Today's Date: 02/23/2018   History of Present Illness  Pt is a 47 y/o female s/p IM nail L tibia, external fixator removal and placement of wound VAC L lower leg. 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 PMH including but not limited to pre-diabetes.    Clinical Impression  Pt presented supine in bed with HOB elevated, awake and willing to participate in therapy session. Prior to admission, pt reported that she was ambulating with use of a RW or bilateral axillary crutches and was independent with ADLs. Pt stated that she could ascend and descend steps at home with RW. Pt has assistance 24/7 at home from her family. Pt currently limited secondary to significant pain in L lower leg, but was able to perform bed mobility with supervision and transferred from bed to [facility] with min guard and RW. Pt's mother present throughout session as well. Pt reported that she has an array of DME at home and is confident in usage. PT will continue to follow pt acutely to progress mobility as tolerated and to ensure a safe d/c home.    Follow Up Recommendations Supervision/Assistance - 24 hour;No PT follow up    Equipment Recommendations  None recommended by PT;Other (comment)(pt already has all DME necessary at home)    Recommendations for Other Services       Precautions / Restrictions Precautions Precautions: Fall Restrictions Weight Bearing Restrictions: Yes LLE Weight Bearing: Non weight bearing      Mobility  Bed Mobility Overal bed mobility: Needs Assistance Bed Mobility: Supine to Sit     Supine to sit: Supervision     General bed mobility comments: supervision for safety, pt using bilateral UEs to assist with L LE movement  Transfers Overall transfer level: Needs assistance Equipment used: Rolling walker  (2 wheeled) Transfers: Sit to/from [facility] Sit to Stand: Min guard Stand pivot transfers: Min guard       General transfer comment: min guard for safety, pt demonstrating good technique  Ambulation/Gait             General Gait Details: pt limited this session secondary to significant L LE pain; requesting more meds from RN, deferred further mobility  Stairs            Wheelchair Mobility    Modified Rankin (Stroke Patients Only)       Balance Overall balance assessment: Needs assistance Sitting-balance support: Feet supported;No upper extremity supported Sitting balance-Leahy Scale: Good     Standing balance support: During functional activity;Bilateral upper extremity supported;Single extremity supported Standing balance-Leahy Scale: Poor                               Pertinent Vitals/Pain Pain Assessment: Faces Faces Pain Scale: Hurts whole lot Pain Location: L LE Pain Descriptors / Indicators: Sore;Grimacing;Guarding Pain Intervention(s): Monitored during session;Repositioned    Home Living Family/patient expects to be discharged to:: Private residence Living Arrangements: Parent Available Help at Discharge: Family;Available 24 hours/day Type of Home: House Home Access: Stairs to enter Entrance Stairs-Rails: None Entrance Stairs-Number of Steps: 3 Home Layout: One level Home Equipment: Walker - 2 wheels;Crutches;Bedside commode;Tub bench;Wheelchair - manual      Prior Function Level of Independence: Independent with assistive device(s)         Comments: pt was  using crutches primarily and still working; mother drives her where she needs to go     Hand Dominance        Extremity/Trunk Assessment   Upper Extremity Assessment Upper Extremity Assessment: Overall WFL for tasks assessed    Lower Extremity Assessment Lower Extremity Assessment: LLE deficits/detail LLE Deficits / Details: pt with heavy ACE  wrap in place from foot to below knee with wound VAC in place as well; pt able to maintain NWB L LE throughout independently LLE: Unable to fully assess due to pain;Unable to fully assess due to immobilization    Cervical / Trunk Assessment Cervical / Trunk Assessment: Normal  Communication   Communication: No difficulties  Cognition Arousal/Alertness: Awake/alert Behavior During Therapy: WFL for tasks assessed/performed Overall Cognitive Status: Within Functional Limits for tasks assessed                                        General Comments      Exercises     Assessment/Plan    PT Assessment Patient needs continued PT services  PT Problem List Decreased strength;Decreased range of motion;Decreased activity tolerance;Decreased balance;Decreased mobility;Decreased coordination;Pain       PT Treatment Interventions DME instruction;Gait training;Stair training;Functional mobility training;Therapeutic activities;Therapeutic exercise;Balance training;Neuromuscular re-education;Patient/family education    PT Goals (Current goals can be found in the Care Plan section)  Acute Rehab PT Goals Patient Stated Goal: decrease pain, return home PT Goal Formulation: With patient/family Time For Goal Achievement: 03/09/18 Potential to Achieve Goals: Good    Frequency Min 5X/week   Barriers to discharge        Co-evaluation               AM-PAC PT "6 Clicks" Daily Activity  Outcome Measure Difficulty turning over in bed (including adjusting bedclothes, sheets and blankets)?: None Difficulty moving from lying on back to sitting on the side of the bed? : None Difficulty sitting down on and standing up from a chair with arms (e.g., wheelchair, bedside commode, etc,.)?: Unable Help needed moving to and from a bed to chair (including a wheelchair)?: None Help needed walking in hospital room?: A Little Help needed climbing 3-5 steps with a railing? : A Lot 6 Click  Score: 18    End of Session   Activity Tolerance: Patient limited by pain Patient left: with call bell/phone within reach;with family/visitor present;Other (comment)(on BSC with OT present) Nurse Communication: Mobility status PT Visit Diagnosis: Other abnormalities of gait and mobility (R26.89);Pain Pain - Right/Left: Left Pain - part of body: Leg    Time: 8127-5170 PT Time Calculation (min) (ACUTE ONLY): 17 min   Charges:   PT Evaluation $PT Eval Moderate Complexity: 1 Mod     PT G Codes:        [place], PT, DPT [phone]   [place] 02/23/2018, 1:47 PM

## 2018-02-23 NOTE — Evaluation (Signed)
Occupational Therapy Evaluation Patient Details Name: [name] Carr MRN: [id number] DOB: [birth year] Today's Date: 02/23/2018    History of Present Illness Pt is a 47 y/o female s/p IM nail L tibia, external fixator removal and placement of wound VAC L lower leg. 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 PMH including but not limited to pre-diabetes.   Clinical Impression   PTA pt reported that she was ambulating with use of a RW or bilateral axillary crutches and was independent with ADLs. Pt stated that she could ascend and descend steps at home with RW. Pt has assistance 24/7 at home from her family. Pt currently limited secondary to significant pain in L lower leg, but was able to perform bed mobility with supervision and transferred from bed to [facility] with min guard and RW. Pt's mother present throughout session as well. Pt reported that she has an array of DME and A/E at home and is confident in usage. Pt is currently at set - sup level with ADLs using compensatory techniques and min guard A with SPTs for mobility with RW. All education completed and no further acute OT is indicated at this time    Follow Up Recommendations  No OT follow up;Supervision - Intermittent    Equipment Recommendations  None recommended by OT    Recommendations for Other Services       Precautions / Restrictions Precautions Precautions: Fall Restrictions Weight Bearing Restrictions: Yes LLE Weight Bearing: Non weight bearing      Mobility Bed Mobility Overal bed mobility: Needs Assistance Bed Mobility: Sit to Supine     Supine to sit: Supervision Sit to supine: Supervision   General bed mobility comments: supervision for safety, pt using bilateral UEs to assist with L LE movement  Transfers Overall transfer level: Needs assistance Equipment used: Rolling walker (2 wheeled) Transfers: Sit to/from [employer] Sit to Stand: Min guard Stand pivot transfers: Min guard       General transfer comment: min guard for safety, pt demonstrating good technique    Balance Overall balance assessment: Needs assistance Sitting-balance support: Feet supported;No upper extremity supported Sitting balance-Leahy Scale: Good     Standing balance support: During functional activity;Bilateral upper extremity supported;Single extremity supported Standing balance-Leahy Scale: Poor                             ADL either performed or assessed with clinical judgement   ADL Overall ADL's : Needs assistance/impaired     Grooming: Wash/dry hands;Wash/dry face;Sitting;Set up   Upper Body Bathing: Sitting;Set up   Lower Body Bathing: Set up;Sitting/lateral leans;With caregiver independent assisting   Upper Body Dressing : Set up;Sitting   Lower Body Dressing: Set up;Sitting/lateral leans;With caregiver independent assisting   Toilet Transfer: Min guard;RW;BSC;With caregiver independent assisting   Toileting- Clothing Manipulation and Hygiene: Set up;Sitting/lateral lean;With caregiver independent assisting       Functional mobility during ADLs: Min guard;Rolling walker;Caregiver able to provide necessary level of assistance General ADL Comments: pt has become quite competent in doing her using compensatory strategies ADLs since this ordeal with L LE began      Vision Patient Visual Report: No change from baseline       Perception     Praxis      Pertinent Vitals/Pain Pain Assessment: 0-10 Pain Score: 7  Faces Pain Scale: Hurts whole lot Pain Location:  L LE Pain Descriptors / Indicators: Sore;Grimacing;Guarding Pain Intervention(s): Monitored during session;Repositioned     Hand Dominance Right   Extremity/Trunk Assessment Upper Extremity Assessment Upper Extremity Assessment: Overall WFL for tasks assessed   Lower Extremity Assessment Lower Extremity  Assessment: Defer to PT evaluation LLE Deficits / Details: pt with heavy ACE wrap in place from foot to below knee with wound VAC in place as well; pt able to maintain NWB L LE throughout independently LLE: Unable to fully assess due to pain;Unable to fully assess due to immobilization   Cervical / Trunk Assessment Cervical / Trunk Assessment: Normal   Communication Communication Communication: No difficulties   Cognition Arousal/Alertness: Awake/alert Behavior During Therapy: WFL for tasks assessed/performed Overall Cognitive Status: Within Functional Limits for tasks assessed                                     General Comments       Exercises     Shoulder Instructions      Home Living Family/patient expects to be discharged to:: Private residence Living Arrangements: Parent Available Help at Discharge: Family;Available 24 hours/day Type of Home: House Home Access: Stairs to enter [employer] of Steps: 3 Entrance Stairs-Rails: None Home Layout: One level     Bathroom Shower/Tub: Teacher, early years/pre: Standard Bathroom Accessibility: Yes   Home Equipment: Environmental consultant - 2 wheels;Crutches;Bedside commode;Tub bench;Wheelchair - manual   Additional Comments: has been staying at parents house due to easier accesibiltiy of home layout      Prior Functioning/Environment Level of Independence: Independent with assistive device(s)        Comments: pt was using crutches primarily and still working; mother drives her where she needs to go        OT Problem List: Decreased activity tolerance;Pain;Impaired balance (sitting and/or standing)      OT Treatment/Interventions:      OT Goals(Current goals can be found in the care plan section) Acute Rehab OT Goals Patient Stated Goal: decrease pain, return home OT Goal Formulation: With patient/family  OT Frequency:     Barriers to D/C:    no barriers, has 24/7 assist at home        Co-evaluation              AM-PAC PT "6 Clicks" Daily Activity     Outcome Measure Help from another person eating meals?: None Help from another person taking care of personal grooming?: None Help from another person toileting, which includes using toliet, bedpan, or urinal?: A Little Help from another person bathing (including washing, rinsing, drying)?: A Little Help from another person to put on and taking off regular upper body clothing?: None Help from another person to put on and taking off regular lower body clothing?: A Little 6 Click Score: 21   End of Session Equipment Utilized During Treatment: Other (comment);Rolling walker(BSC)  Activity Tolerance: Patient tolerated treatment well Patient left: in bed;with call bell/phone within reach;with family/visitor present  OT Visit Diagnosis: Other abnormalities of gait and mobility (R26.89);Pain Pain - Right/Left: Left Pain - part of body: Leg                Time: 3536-1443 OT Time Calculation (min): 24 min Charges:  OT Evaluation $OT Eval Moderate Complexity: 1 Mod OT Treatments $Therapeutic Activity: 8-22 mins G-Codes: OT G-codes **NOT FOR INPATIENT CLASS** Functional Assessment Tool Used: AM-PAC 6  Clicks Daily Activity     Britt Bottom 02/23/2018, 2:48 PM

## 2018-02-24 DIAGNOSIS — D62 Acute posthemorrhagic anemia: Secondary | ICD-10-CM

## 2018-02-24 LAB — CBC
HCT: 24.4 % — ABNORMAL LOW (ref 36.0–46.0)
HEMOGLOBIN: 7.5 g/dL — AB (ref 12.0–15.0)
MCH: 26.6 pg (ref 26.0–34.0)
MCHC: 30.7 g/dL (ref 30.0–36.0)
MCV: 86.5 fL (ref 78.0–100.0)
Platelets: 400 10*3/uL (ref 150–400)
RBC: 2.82 MIL/uL — AB (ref 3.87–5.11)
RDW: 15.5 % (ref 11.5–15.5)
WBC: 9.4 10*3/uL (ref 4.0–10.5)

## 2018-02-24 LAB — BASIC METABOLIC PANEL
ANION GAP: 9 (ref 5–15)
BUN: 5 mg/dL — ABNORMAL LOW (ref 6–20)
CHLORIDE: 100 mmol/L — AB (ref 101–111)
CO2: 28 mmol/L (ref 22–32)
Calcium: 8.5 mg/dL — ABNORMAL LOW (ref 8.9–10.3)
Creatinine, Ser: 0.67 mg/dL (ref 0.44–1.00)
GFR calc non Af Amer: >60 mL/min (ref >60)
Glucose, Bld: 117 mg/dL — ABNORMAL HIGH (ref 65–99)
Potassium: 3.7 mmol/L (ref 3.5–5.1)
Sodium: 137 mmol/L (ref 135–145)

## 2018-02-24 LAB — VITAMIN D 25 HYDROXY (VIT D DEFICIENCY, FRACTURES): Vit D, 25-Hydroxy: 48 ng/mL (ref 30.0–100.0)

## 2018-02-24 MED ORDER — OXYCODONE-ACETAMINOPHEN 7.5-325 MG PO TABS: 1.0000 | ORAL_TABLET | Freq: Four times a day (QID) | ORAL | 0 refills | Status: DC | PRN

## 2018-02-24 MED ORDER — ACETAMINOPHEN 325 MG PO TABS: 325.0000 mg | ORAL_TABLET | Freq: Four times a day (QID) | ORAL | Status: DC | PRN

## 2018-02-24 MED ORDER — CHOLECALCIFEROL 125 MCG (5000 UT) PO TABS: 5000.0000 [IU] | ORAL_TABLET | Freq: Every day | ORAL | Status: DC

## 2018-02-24 MED ORDER — ENOXAPARIN SODIUM 40 MG/0.4ML ~~LOC~~ SOLN: 40.0000 mg | SUBCUTANEOUS | 0 refills | Status: DC

## 2018-02-24 MED ORDER — CYCLOBENZAPRINE HCL 5 MG PO TABS: 5.0000 mg | ORAL_TABLET | Freq: Three times a day (TID) | ORAL | 0 refills | Status: DC | PRN

## 2018-02-24 MED ORDER — FERROUS SULFATE 325 (65 FE) MG PO TBEC: 325.0000 mg | DELAYED_RELEASE_TABLET | Freq: Three times a day (TID) | ORAL | 3 refills | Status: DC

## 2018-02-24 MED ORDER — DOCUSATE SODIUM 100 MG PO CAPS: 100.0000 mg | ORAL_CAPSULE | Freq: Two times a day (BID) | ORAL | 0 refills | Status: DC

## 2018-02-24 NOTE — Progress Notes (Signed)
Discharge home. Home discharge instruction given, no question verbalized. 

## 2018-02-24 NOTE — Care Management Note (Signed)
Case Management Note  Patient Details  Name: [name] Carr MRN: [id number] Date of Birth: [birth year]  Subjective/Objective:  47 yr old female s/p removal of left tibia external fixator and IM Nailing of left tibial fracture.                  Action/Plan: Patient has no case management needs.    Expected Discharge Date:  02/24/18               Expected Discharge Plan:  Home/Self Care  In-House Referral:  NA  Discharge planning Services  CM Consult  Post Acute Care Choice:  NA Choice offered to:  NA  DME Arranged:  N/A DME Agency:  NA  HH Arranged:  NA HH Agency:  NA  Status of Service:  Completed, signed off  If discussed at Many of Stay Meetings, dates discussed:    Additional Comments:  Ninfa Meeker, RN 02/24/2018, 2:41 PM

## 2018-02-24 NOTE — Progress Notes (Signed)
Physical Therapy Treatment Patient Details Name: [name] Carr MRN: [id number] DOB: April 05, 1971 Today's Date: 02/24/2018    History of Present Illness Pt is a 47 y/o female s/p IM nail L tibia, external fixator removal and placement of wound VAC L lower leg. 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 PMH including but not limited to pre-diabetes.    PT Comments    Pt making great progress with functional mobility. PT will continue to follow acutely to progress mobility as tolerated and to ensure a safe d/c home.   Follow Up Recommendations  Supervision/Assistance - 24 hour;No PT follow up     Equipment Recommendations  None recommended by PT    Recommendations for Other Services       Precautions / Restrictions Precautions Precautions: Fall Restrictions Weight Bearing Restrictions: Yes LLE Weight Bearing: Non weight bearing    Mobility  Bed Mobility Overal bed mobility: Modified Independent                Transfers Overall transfer level: Needs assistance Equipment used: Rolling walker (2 wheeled) Transfers: Sit to/from Stand Sit to Stand: Supervision         General transfer comment: supervision for safety, good technique; pt performed x1 from EOB and x1 from toilet  Ambulation/Gait Ambulation/Gait assistance: Min guard Ambulation Distance (Feet): 20 Feet(20' x2) Assistive device: Rolling walker (2 wheeled) Gait Pattern/deviations: (hop-to on R LE) Gait velocity: decreased Gait velocity interpretation: <1.31 ft/sec, indicative of household ambulator General Gait Details: pt able to ambulate into bathroom and back to bed after toileting; she was able to maintain NWB L LE throughout independently; pt steady with RW and min guard (PT managing wound VAC)   Stairs             Wheelchair Mobility    Modified Rankin (Stroke Patients Only)       Balance Overall  balance assessment: Needs assistance Sitting-balance support: Feet supported;No upper extremity supported Sitting balance-Leahy Scale: Good     Standing balance support: During functional activity;Bilateral upper extremity supported;Single extremity supported Standing balance-Leahy Scale: Poor                              Cognition Arousal/Alertness: Awake/alert Behavior During Therapy: WFL for tasks assessed/performed Overall Cognitive Status: Within Functional Limits for tasks assessed                                        Exercises      General Comments        Pertinent Vitals/Pain Pain Assessment: Faces Faces Pain Scale: Hurts a little bit Pain Location: L LE Pain Descriptors / Indicators: Sore;Grimacing;Guarding Pain Intervention(s): Monitored during session;Repositioned    Home Living                      Prior Function            PT Goals (current goals can now be found in the care plan section) Acute Rehab PT Goals PT Goal Formulation: With patient/family Time For Goal Achievement: 03/09/18 Potential to Achieve Goals: Good Progress towards PT goals: Progressing toward goals    Frequency    Min 5X/week      PT Plan Current plan remains appropriate    Co-evaluation  AM-PAC PT "6 Clicks" Daily Activity  Outcome Measure  Difficulty turning over in bed (including adjusting bedclothes, sheets and blankets)?: None Difficulty moving from lying on back to sitting on the side of the bed? : None Difficulty sitting down on and standing up from a chair with arms (e.g., wheelchair, bedside commode, etc,.)?: Unable Help needed moving to and from a bed to chair (including a wheelchair)?: None Help needed walking in hospital room?: None Help needed climbing 3-5 steps with a railing? : A Little 6 Click Score: 20    End of Session   Activity Tolerance: Patient tolerated treatment well Patient left: in  bed;with call bell/phone within reach;with family/visitor present;with SCD's reapplied Nurse Communication: Mobility status PT Visit Diagnosis: Other abnormalities of gait and mobility (R26.89);Pain Pain - Right/Left: Left Pain - part of body: Leg     Time: 3016-0109 PT Time Calculation (min) (ACUTE ONLY): 10 min  Charges:  $Therapeutic Activity: 8-22 mins                    G Codes:       [employer] [place] 02/24/2018, 2:11 PM

## 2018-02-24 NOTE — Progress Notes (Signed)
Orthopedic Trauma Service Progress Note   Patient ID: LEONTINE RADMAN MRN: [id number] DOB/AGE: September 08, 1971 47 y.o.  Subjective:  Doing very well Ready to go home No acute issues  Denies any chest pain, shortness of breath, no nausea or vomiting, no dizziness or lightheadedness.  Patient has been out of bed already today   Vitamin D is dramatically improved.  ROS As above  Objective:   VITALS:   Vitals:   02/23/18 0004 02/23/18 0408 02/23/18 1949 02/24/18 0500  BP: 117/66 109/67 120/69 119/73  Pulse: 88 95 97 92  Resp: 16 16 16 16   Temp: 98.3 F (36.8 C) 98.2 F (36.8 C) 98.8 F (37.1 C) 98.3 F (36.8 C)  TempSrc: Oral Oral Oral Oral  SpO2: 98%  100% 94%  Weight:        Estimated body mass index is 29.54 kg/m as calculated from the following:   Height as of 02/16/18: 5\' 6"  (1.676 m).   Weight as of this encounter: 83 kg (183 lb).   Intake/Output      05/01 0701 - 05/02 0700 05/02 0701 - 05/03 0700   P.O.  360   I.V. (mL/kg)     IV Piggyback     Total Intake(mL/kg)  360 (4.3)   Urine (mL/kg/hr) 0 (0)    Drains 0    Blood     Total Output 0    Net 0 +360        Urine Occurrence 2 x      LABS  Results for orders placed or performed during the hospital encounter of 02/22/18 (from the past 24 hour(s))  Basic metabolic panel     Status: Abnormal   Collection Time: 02/24/18  3:43 AM  Result Value Ref Range   Sodium 137 135 - 145 mmol/L   Potassium 3.7 3.5 - 5.1 mmol/L   Chloride 100 (L) 101 - 111 mmol/L   CO2 28 22 - 32 mmol/L   Glucose, Bld 117 (H) 65 - 99 mg/dL   BUN 5 (L) 6 - 20 mg/dL   Creatinine, Ser 0.67 0.44 - 1.00 mg/dL   Calcium 8.5 (L) 8.9 - 10.3 mg/dL   GFR calc non Af Amer >60 >60 mL/min   GFR calc Af Amer >60 >60 mL/min   Anion gap 9 5 - 15  CBC     Status: Abnormal   Collection Time: 02/24/18  3:43 AM  Result Value Ref Range   WBC 9.4 4.0 - 10.5 K/uL   RBC 2.82 (L) 3.87 - 5.11 MIL/uL   Hemoglobin 7.5 (L)  12.0 - 15.0 g/dL   HCT 24.4 (L) 36.0 - 46.0 %   MCV 86.5 78.0 - 100.0 fL   MCH 26.6 26.0 - 34.0 pg   MCHC 30.7 30.0 - 36.0 g/dL   RDW 15.5 11.5 - 15.5 %   Platelets 400 150 - 400 K/uL    Results for HAMDI, VARI (MRN [id number]) as of 02/24/2018 12:19  Ref. Range 02/23/2018 04:31  Vitamin D, 25-Hydroxy Latest Ref Range: 30.0 - 100.0 ng/mL 48.0   PHYSICAL EXAM:   Gen: Resting comfortably in bed, no acute distress, appears well Lungs: Breathing is unlabored Cardiac: Regular Ext:     Right Lower Extremity                   Dressing stable                  Motor and sensory functions intact  No acute findings noted                   Ext warm                Left Lower Extremity                    Splint fitting well                   prevena functioning, I changed the hospital VAC over to the portable home unit                   EHL, FHL, lesser toe motor intact                   DPN, SPN, TN sensation intact                   Ext warm                    Swelling controlled      Assessment/Plan: 2 Days Post-Op   Principal Problem:   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 02/22/18 [id number] 02/22/18 0810    .  POD/HD#: 2  47 y/o female s/p repair or L distal tibia and fibula nonunion    -L distal tibia and fibula nonunion s/p IMN and grafting with RIA             NWB x 6-8 weeks             Will leave VAC on for 7 days              Follow up in 1 week for splint and vac  removal             Soft tissue envelope very tenuous              PT/OT evals             Unrestricted ROM L knee                         No pillows under bend of knee at rest    - Pain management:             Percocet, oxy IR, flexeril   - ABL anemia/Hemodynamics             asymptomatic   No surprised at blood loss given procedures performed  Iron supplementation    - Medical issues              Asthma and anxiety                          Home meds   - DVT/PE prophylaxis:  Lovenox x 14 days    - ID:              Perioperative antibiotics completed  No organisms seen on intraoperative cultures we will follow-up on final cultures   - Metabolic Bone Disease:             Vitamin d deficiency                          Vitamin D deficiency has been corrected.  Continue with vitamin D 3 5000 IUs daily.  Recheck labs in 12 weeks   - Activity:             OOB as tolerated              NWB L leg   - FEN/GI prophylaxis/Foley/Lines:             Reg diet           DC IV   -Ex-fix/Splint care:             Keep splint clean and dry      - Dispo:             dc home today  Follow up at office in 1 week     Jari Pigg, PA-C [facility] [phone] ([phone] Levi Aland (C) 02/24/2018, 12:21 PM

## 2018-02-24 NOTE — Discharge Summary (Signed)
[facility] ([facility])  Patient ID: [name] Carr MRN: [id number] DOB/AGE: Dec 19, 1970 47 y.o.  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 Anxiety   . Asthma   . Complication of anesthesia    one time had difficulty waking up  . DVT (deep venous thrombosis) ([place])   . IUD (intrauterine device) in place    Paraguard IUD inserted 2008  . Pneumonia    walking pneumonia  . PONV (postoperative nausea and vomiting)    'MANY MANY MANY years ago"  . Pre-diabetes    cnotrolled with diet an exercise     Procedures Performed: 02/22/2018- Dr. Marcelino Scot  1. Intramedullary nailing of the left tibia with a Biomet VersaNail 8     x 315 mm statically locked. 2. Removal of external fixator under anesthesia, left ankle. 3. Repair of left tibia nonunion using reamed intramedullary autograft     from the right femur. 4. Repair of left fibular nonunion with autograft. 5. Removal of cement spacers, tibia and fibula. 6. Application of small incisional wound VAC. 7. Removal of deep implant, left tibia and fibula   Discharged Condition: good  Hospital Course:    47 y/o female with chronic L distal tibia and fibula nonunion following an open fracture was admitted on 02/22/2018 for 2nd portion of a staged procedure. See previous dc summary for information about her first procedure by the OTS.  Pt presented this hospitalization for removal of abx spacer, removal of ex fix followed by autografting of her nonunion sites and IMN of L tibia and ORIF L fibula. Autograft was harvested from her R femur using RIA technique. Pt  tolerated surgery well. After surgery she was transferred to Ortho floor for observation, pain control and therapies. pts hospital stay was uncomplicated. She worked well with PT and OT. She was deemed stable for dc to home on POD2  Pt is on lovenox for DVT and PE prophylaxis. She will continue this for another 14 days  She was covered with ancef for periop abx coverage  Intra-op specimens of nonunion site to not show any bacteria   We did monitor pts H/H, it did drift down which is not surprising for the surgery she has had.  At the time of dc she is asymptomatic with respect to her ABLA   Pt will remain splinted until follow up appointment in 1 week. There is a prevena incisional vac in place which will be removed in 1 week as well.   Pt tolerating a regular diet, mobilizing well and voiding w/o difficulty  Pt discharged in stable condition to home on 02/24/2018  Consults: None  Significant Diagnostic Studies: labs:   Results for [name] Carr (MRN [id number]) as of 02/24/2018 13:06  Ref. Range 02/24/2018 03:43  Sodium Latest Ref Range: 135 - 145 mmol/L 137  Potassium Latest Ref Range: 3.5 - 5.1 mmol/L 3.7  Chloride Latest Ref Range: 101 - 111 mmol/L 100 (L)  CO2 Latest Ref Range: 22 - 32 mmol/L 28  Glucose Latest Ref Range: 65 - 99 mg/dL 117 (H)  BUN Latest Ref Range: 6 - 20 mg/dL  5 (L)  Creatinine Latest Ref Range: 0.44 - 1.00 mg/dL 0.67  Calcium Latest Ref Range: 8.9 - 10.3 mg/dL 8.5 (L)  Anion gap Latest Ref Range: 5 - 15  9  GFR, Est Non African American Latest Ref Range: >60 mL/min >60  GFR, Est African American Latest Ref Range: >60 mL/min >60  WBC Latest Ref Range: 4.0 - 10.5 K/uL 9.4  RBC Latest Ref Range: 3.87 - 5.11 MIL/uL 2.82 (L)  Hemoglobin Latest Ref Range: 12.0 - 15.0 g/dL 7.5 (L)  HCT Latest Ref Range: 36.0 - 46.0 % 24.4 (L)  MCV Latest Ref Range: 78.0 - 100.0 fL 86.5  MCH Latest Ref Range: 26.0 - 34.0 pg 26.6  MCHC Latest Ref Range: 30.0 - 36.0 g/dL 30.7  RDW  Latest Ref Range: 11.5 - 15.5 % 15.5  Platelets Latest Ref Range: 150 - 400 K/uL 400  Results for ANALISA, SLEDD (MRN [id number]) as of 02/24/2018 13:06  Ref. Range 02/23/2018 04:31  Vitamin D, 25-Hydroxy Latest Ref Range: 30.0 - 100.0 ng/mL 48.0    Treatments: IV hydration, antibiotics: Ancef, analgesia: Dilaudid, percocet, oxy IR, anticoagulation: LMW heparin, therapies: PT, OT and RN and surgery: as above    Discharge Exam:       [facility] Service Progress Note    Patient ID: [name] Carr MRN: [id number] DOB/AGE: 04-18-1971 47 y.o.   Subjective:   Doing very well Ready to go home No acute issues   Denies any chest pain, shortness of breath, no nausea or vomiting, no dizziness or lightheadedness.   Patient has been out of bed already today     Vitamin D is dramatically improved.   ROS As above   Objective:    VITALS:   Vitals:    02/23/18 0004 02/23/18 0408 02/23/18 1949 02/24/18 0500  BP: 117/66 109/67 120/69 119/73  Pulse: 88 95 97 92  Resp: 16 16 16 16   Temp: 98.3 F (36.8 C) 98.2 F (36.8 C) 98.8 F (37.1 C) 98.3 F (36.8 C)  TempSrc: Oral Oral Oral Oral  SpO2: 98%   100% 94%  Weight:              Estimated body mass index is 29.54 kg/m as calculated from the following:   Height as of 02/16/18: 5\' 6"  (1.676 m).   Weight as of this encounter: 83 kg (183 lb).     Intake/Output      05/01 0701 - 05/02 0700 05/02 0701 - 05/03 0700   P.O.  360   I.V. (mL/kg)     IV Piggyback     Total Intake(mL/kg)  360 (4.3)   Urine (mL/kg/hr) 0 (0)    Drains 0    Blood     Total Output 0    Net 0 +360        Urine Occurrence 2 x       LABS   Lab Results Last 24 Hours       Results for orders placed or performed during the hospital encounter of 02/22/18 (from the past 24 hour(s))  Basic metabolic panel     Status: Abnormal    Collection Time: 02/24/18  3:43 AM  Result Value Ref Range    Sodium 137 135 - 145 mmol/L    Potassium 3.7 3.5 - 5.1  mmol/L    Chloride 100 (L) 101 - 111 mmol/L    CO2 28 22 - 32 mmol/L    Glucose, Bld 117 (H) 65 -  99 mg/dL    BUN 5 (L) 6 - 20 mg/dL    Creatinine, Ser 0.67 0.44 - 1.00 mg/dL    Calcium 8.5 (L) 8.9 - 10.3 mg/dL    GFR calc non Af Amer >60 >60 mL/min    GFR calc Af Amer >60 >60 mL/min    Anion gap 9 5 - 15  CBC     Status: Abnormal    Collection Time: 02/24/18  3:43 AM  Result Value Ref Range    WBC 9.4 4.0 - 10.5 K/uL    RBC 2.82 (L) 3.87 - 5.11 MIL/uL    Hemoglobin 7.5 (L) 12.0 - 15.0 g/dL    HCT 24.4 (L) 36.0 - 46.0 %    MCV 86.5 78.0 - 100.0 fL    MCH 26.6 26.0 - 34.0 pg    MCHC 30.7 30.0 - 36.0 g/dL    RDW 15.5 11.5 - 15.5 %    Platelets 400 150 - 400 K/uL        Results for KENNDRA, MORRIS (MRN [id number]) as of 02/24/2018 12:19   Ref. Range 02/23/2018 04:31  Vitamin D, 25-Hydroxy Latest Ref Range: 30.0 - 100.0 ng/mL 48.0    PHYSICAL EXAM:    Gen: Resting comfortably in bed, no acute distress, appears well Lungs: Breathing is unlabored Cardiac: Regular Ext:     Right Lower Extremity                   Dressing stable                  Motor and sensory functions intact                  No acute findings noted                   Ext warm                Left Lower Extremity                    Splint fitting well                   prevena functioning, I changed the hospital VAC over to the portable home unit                   EHL, FHL, lesser toe motor intact                   DPN, SPN, TN sensation intact                   Ext warm                    Swelling controlled        Assessment/Plan: 2 Days Post-Op    Principal Problem:   Open fracture of distal end of fibula and tibia, left, type III, with nonunion, subsequent encounter Active Problems:   Anxiety   Asthma   Acute blood loss anemia     Anti-infectives (From admission, onward)    Start     Dose/Rate Route Frequency Ordered Stop    02/22/18 1700   ceFAZolin (ANCEF) IVPB 1 g/50 mL premix     1  g 100 mL/hr over 30 Minutes Intravenous Every 6 hours 02/22/18 1648 02/23/18 0603    02/22/18 0627   ceFAZolin (ANCEF) IVPB 2g/100 mL premix     2 g  200 mL/hr over 30 Minutes Intravenous On call to O.R. 02/22/18 6384 02/22/18 0810     .   POD/HD#: 2   47 y/o female s/p repair or L distal tibia and fibula nonunion    -L distal tibia and fibula nonunion s/p IMN and grafting with RIA             NWB x 6-8 weeks             Will leave VAC on for 7 days              Follow up in 1 week for splint and vac removal             Soft tissue envelope very tenuous              PT/OT evals             Unrestricted ROM L knee                         No pillows under bend of knee at rest    - Pain management:             Percocet, oxy IR, flexeril   - ABL anemia/Hemodynamics             asymptomatic              No surprised at blood loss given procedures performed             Iron supplementation    - Medical issues              Asthma and anxiety                          Home meds   - DVT/PE prophylaxis:             Lovenox x 14 days    - ID:              Perioperative antibiotics completed             No organisms seen on intraoperative cultures we will follow-up on final cultures   - Metabolic Bone Disease:             Vitamin d deficiency                          Vitamin D deficiency has been corrected.  Continue with vitamin D 3 5000 IUs daily.  Recheck labs in 12 weeks   - Activity:             OOB as tolerated              NWB L leg   - FEN/GI prophylaxis/Foley/Lines:             Reg diet                      DC IV   -Ex-fix/Splint care:             Keep splint clean and dry      - Dispo:             dc home today             Follow up at office in 1 week    Disposition: Discharge disposition: 01-Home or Self Care  Discharge Instructions    Call MD / Call 911   Complete by:  As directed    If you experience chest pain or shortness of breath, CALL  911 and be transported to the hospital emergency room.  If you develope a fever above 101 F, pus (white drainage) or increased drainage or redness at the wound, or calf pain, call your surgeon's office.   Constipation Prevention   Complete by:  As directed    Drink plenty of fluids.  Prune juice may be helpful.  You may use a stool softener, such as Colace (over the counter) 100 mg twice a day.  Use MiraLax (over the counter) for constipation as needed.   Diet general   Complete by:  As directed    Discharge instructions   Complete by:  As directed    [facility] Discharge Instructions   General Discharge Instructions  WEIGHT BEARING STATUS: Nonweightbearing left leg.  Weight-bear as tolerated right leg   RANGE OF MOTION/ACTIVITY: No range of motion of left ankle as you are splinted.  Okay to move left knee.  No range of motion restrictions right leg.  Activity as tolerated while maintaining weightbearing restrictions  Wound Care: Do not remove splint.  We will remove at your first office visit.  Please bring cam boot with you to office follow-up.  Keep splint clean and dry do not get wet.  DVT/PE prophylaxis: Lovenox 40 mg subcutaneous injection daily for 14 days then aspirin 325 mg daily  Diet: as you were eating previously.  Can use over the counter stool softeners and bowel preparations, such as Miralax, to help with bowel movements.  Narcotics can be constipating.  Be sure to drink plenty of fluids  PAIN MEDICATION USE AND EXPECTATIONS  You have likely been given narcotic medications to help control your pain.  After a traumatic event that results in an fracture (broken bone) with or without surgery, it is ok to use narcotic pain medications to help control one's pain.  We understand that everyone responds to pain differently and each individual patient will be evaluated on a regular basis for the continued need for narcotic medications. Ideally, narcotic medication use  should last no more than 6-8 weeks (coinciding with fracture healing).   As a patient it is your responsibility as well to monitor narcotic medication use and report the amount and frequency you use these medications when you come to your office visit.   We would also advise that if you are using narcotic medications, you should take a dose prior to therapy to maximize you participation.  IF YOU ARE ON NARCOTIC MEDICATIONS IT IS NOT PERMISSIBLE TO OPERATE A MOTOR VEHICLE (MOTORCYCLE/CAR/TRUCK/MOPED) OR HEAVY MACHINERY DO NOT MIX NARCOTICS WITH OTHER CNS (CENTRAL NERVOUS SYSTEM) DEPRESSANTS SUCH AS ALCOHOL   STOP SMOKING OR USING NICOTINE PRODUCTS!!!!  As discussed nicotine severely impairs your body's ability to heal surgical and traumatic wounds but also impairs bone healing.  Wounds and bone heal by forming microscopic blood vessels (angiogenesis) and nicotine is a vasoconstrictor (essentially, shrinks blood vessels).  Therefore, if vasoconstriction occurs to these microscopic blood vessels they essentially disappear and are unable to deliver necessary nutrients to the healing tissue.  This is one modifiable factor that you can do to dramatically increase your chances of healing your injury.    (This means no smoking, no nicotine gum, patches, etc)  DO NOT USE NONSTEROIDAL ANTI-INFLAMMATORY DRUGS (NSAID'S)  Using products such as Advil (ibuprofen), Aleve (  naproxen), Motrin (ibuprofen) for additional pain control during fracture healing can delay and/or prevent the healing response.  If you would like to take over the counter (OTC) medication, Tylenol (acetaminophen) is ok.  However, some narcotic medications that are given for pain control contain acetaminophen as well. Therefore, you should not exceed more than 4000 mg of tylenol in a day if you do not have liver disease.  Also note that there are may OTC medicines, such as cold medicines and allergy medicines that my contain tylenol as well.  If you  have any questions about medications and/or interactions please ask your doctor/PA or your pharmacist.      ICE AND ELEVATE INJURED/OPERATIVE EXTREMITY  Using ice and elevating the injured extremity above your heart can help with swelling and pain control.  Icing in a pulsatile fashion, such as 20 minutes on and 20 minutes off, can be followed.    Do not place ice directly on skin. Make sure there is a barrier between to skin and the ice pack.    Using frozen items such as frozen peas works well as the conform nicely to the are that needs to be iced.  USE AN ACE WRAP OR TED HOSE FOR SWELLING CONTROL  In addition to icing and elevation, Ace wraps or TED hose are used to help limit and resolve swelling.  It is recommended to use Ace wraps or TED hose until you are informed to stop.    When using Ace Wraps start the wrapping distally (farthest away from the body) and wrap proximally (closer to the body)   Example: If you had surgery on your leg or thing and you do not have a splint on, start the ace wrap at the toes and work your way up to the thigh        If you had surgery on your upper extremity and do not have a splint on, start the ace wrap at your fingers and work your way up to the upper arm  IF YOU ARE IN A SPLINT OR CAST DO NOT [place]   If your splint gets wet for any reason please contact the office immediately. You may shower in your splint or cast as long as you keep it dry.  This can be done by wrapping in a cast cover or garbage back (or similar)  Do Not stick any thing down your splint or cast such as pencils, money, or hangers to try and scratch yourself with.  If you feel itchy take benadryl as prescribed on the bottle for itching  IF YOU ARE IN A CAM BOOT (BLACK BOOT)  You may remove boot periodically. Perform daily dressing changes as noted below.  Wash the liner of the boot regularly and wear a sock when wearing the boot. It is recommended that you sleep in the  boot until told otherwise  CALL THE OFFICE WITH ANY QUESTIONS OR CONCERNS: [phone]   Driving restrictions   Complete by:  As directed    No driving until further notice   Increase activity slowly as tolerated   Complete by:  As directed    Non weight bearing   Complete by:  As directed    Laterality:  left   Extremity:  Lower     Allergies as of 02/24/2018      Reactions   Morphine Shortness Of Breath   LIKELY DOSE RELATED   Latex Hives, Itching, Rash   Tetracycline Hives, Itching,  Rash   Chlorhexidine Itching, Rash   CHG wipes in pre-op      Medication List    TAKE these medications   acetaminophen 325 MG tablet Commonly known as:  TYLENOL Take 1-2 tablets (325-650 mg total) by mouth every 6 (six) hours as needed for mild pain (pain score 1-3 or temp > 100.5). What changed:    medication strength  how much to take  reasons to take this   adapalene 0.1 % gel Commonly known as:  DIFFERIN Apply 1 application topically daily as needed (acne). Acne   ALPRAZolam 0.5 MG tablet Commonly known as:  XANAX Take 0.5 mg by mouth at bedtime.   ascorbic acid 500 MG tablet Commonly known as:  VITAMIN C Take 1 tablet (500 mg total) by mouth daily.   calcium citrate 950 MG tablet Commonly known as:  CALCITRATE - dosed in mg elemental calcium Take 1 tablet (200 mg of elemental calcium total) by mouth 2 (two) times daily.   Cholecalciferol 5000 units Tabs Take 1 tablet (5,000 Units total) by mouth daily. Start taking on:  02/25/2018 What changed:  when to take this   citalopram 40 MG tablet Commonly known as:  CELEXA Take 40 mg by mouth at bedtime.   cyclobenzaprine 5 MG tablet Commonly known as:  FLEXERIL Take 1-2 tablets (5-10 mg total) by mouth 3 (three) times daily as needed for muscle spasms.   docusate sodium 100 MG capsule Commonly known as:  COLACE Take 1 capsule (100 mg total) by mouth 2 (two) times daily.   enoxaparin 40 MG/0.4ML injection Commonly  known as:  LOVENOX Inject 0.4 mLs (40 mg total) into the skin daily.   ferrous sulfate 325 (65 FE) MG EC tablet Take 1 tablet (325 mg total) by mouth 3 (three) times daily with meals.   montelukast 10 MG tablet Commonly known as:  SINGULAIR Take 10 mg by mouth at bedtime.   oxyCODONE 5 MG immediate release tablet Commonly known as:  Oxy IR/ROXICODONE Take 1-2 tablets (5-10 mg total) by mouth every 6 (six) hours as needed for breakthrough pain (for breakthrough pain only. use between percocet doses if needed).   oxyCODONE-acetaminophen 7.5-325 MG tablet Commonly known as:  PERCOCET Take 1-2 tablets by mouth every 6 (six) hours as needed for severe pain. What changed:    how much to take  reasons to take this   PROAIR HFA 108 (90 Base) MCG/ACT inhaler Generic drug:  albuterol Inhale 2 puffs into the lungs every 6 (six) hours as needed for wheezing or shortness of breath.            Discharge Care Instructions  (From admission, onward)        Start     Ordered   02/24/18 0000  Non weight bearing    Question Answer Comment  Laterality left   Extremity Lower      02/24/18 1232     Follow-up Information    Altamese [place], MD. Schedule an appointment as soon as possible for a visit on 03/02/2018.   Specialty:  Orthopedic Surgery Contact information: [place] [address] [place] [address] [phone]           Discharge Instructions and Plan:  47 y/o female s/p repair or L distal tibia and fibula nonunion    -L distal tibia and fibula nonunion s/p IMN and grafting with RIA from R femur  NWB x 6-8 weeks             Will leave VAC on for 7 days              Follow up in 1 week for splint and vac removal             Soft tissue envelope very tenuous              PT/OT evals             Unrestricted ROM L knee                         No pillows under bend of knee at rest    Dressing changes to R hip as needed   - Pain management:              Percocet, oxy IR, flexeril   - ABL anemia/Hemodynamics             asymptomatic              No surprised at blood loss given procedures performed             Iron supplementation    - Medical issues              Asthma and anxiety                          Home meds   - DVT/PE prophylaxis:             Lovenox x 14 days    - ID:              Perioperative antibiotics completed             No organisms seen on intraoperative cultures we will follow-up on final cultures   - Metabolic Bone Disease:             Vitamin d deficiency                          Vitamin D deficiency has been corrected.  Continue with vitamin D 3 5000 IUs daily.  Recheck labs in 12 weeks   - Activity:             OOB as tolerated              NWB L leg   - FEN/GI prophylaxis/Foley/Lines:             Reg diet                      DC IV   -Ex-fix/Splint care:             Keep splint clean and dry      - Dispo:             dc home today             Follow up at office in 1 week    Signed:  Jari Pigg, PA-C [facility] [phone] (P) 02/24/2018, 1:09 PM

## 2018-02-24 NOTE — Discharge Instructions (Signed)
Orthopaedic Trauma Service Discharge Instructions   General Discharge Instructions  WEIGHT BEARING STATUS: Nonweightbearing left leg.  Weight-bear as tolerated right leg   RANGE OF MOTION/ACTIVITY: No range of motion of left ankle as you are splinted.  Okay to move left knee.  No range of motion restrictions right leg.  Activity as tolerated while maintaining weightbearing restrictions  Wound Care: Do not remove splint.  We will remove at your first office visit.  Please bring cam boot with you to office follow-up.  Keep splint clean and dry do not get wet.  DVT/PE prophylaxis: Lovenox 40 mg subcutaneous injection daily for 14 days then aspirin 325 mg daily  Diet: as you were eating previously.  Can use over the counter stool softeners and bowel preparations, such as Miralax, to help with bowel movements.  Narcotics can be constipating.  Be sure to drink plenty of fluids  PAIN MEDICATION USE AND EXPECTATIONS  You have likely been given narcotic medications to help control your pain.  After a traumatic event that results in an fracture (broken bone) with or without surgery, it is ok to use narcotic pain medications to help control one's pain.  We understand that everyone responds to pain differently and each individual patient will be evaluated on a regular basis for the continued need for narcotic medications. Ideally, narcotic medication use should last no more than 6-8 weeks (coinciding with fracture healing).   As a patient it is your responsibility as well to monitor narcotic medication use and report the amount and frequency you use these medications when you come to your office visit.   We would also advise that if you are using narcotic medications, you should take a dose prior to therapy to maximize you participation.  IF YOU ARE ON NARCOTIC MEDICATIONS IT IS NOT PERMISSIBLE TO OPERATE A MOTOR VEHICLE (MOTORCYCLE/CAR/TRUCK/MOPED) OR HEAVY MACHINERY DO NOT MIX NARCOTICS WITH OTHER CNS  (CENTRAL NERVOUS SYSTEM) DEPRESSANTS SUCH AS ALCOHOL   STOP SMOKING OR USING NICOTINE PRODUCTS!!!!  As discussed nicotine severely impairs your body's ability to heal surgical and traumatic wounds but also impairs bone healing.  Wounds and bone heal by forming microscopic blood vessels (angiogenesis) and nicotine is a vasoconstrictor (essentially, shrinks blood vessels).  Therefore, if vasoconstriction occurs to these microscopic blood vessels they essentially disappear and are unable to deliver necessary nutrients to the healing tissue.  This is one modifiable factor that you can do to dramatically increase your chances of healing your injury.    (This means no smoking, no nicotine gum, patches, etc)  DO NOT USE NONSTEROIDAL ANTI-INFLAMMATORY DRUGS (NSAID'S)  Using products such as Advil (ibuprofen), Aleve (naproxen), Motrin (ibuprofen) for additional pain control during fracture healing can delay and/or prevent the healing response.  If you would like to take over the counter (OTC) medication, Tylenol (acetaminophen) is ok.  However, some narcotic medications that are given for pain control contain acetaminophen as well. Therefore, you should not exceed more than 4000 mg of tylenol in a day if you do not have liver disease.  Also note that there are may OTC medicines, such as cold medicines and allergy medicines that my contain tylenol as well.  If you have any questions about medications and/or interactions please ask your doctor/PA or your pharmacist.      ICE AND ELEVATE INJURED/OPERATIVE EXTREMITY  Using ice and elevating the injured extremity above your heart can help with swelling and pain control.  Icing in a pulsatile fashion, such as 20  minutes on and 20 minutes off, can be followed.    Do not place ice directly on skin. Make sure there is a barrier between to skin and the ice pack.    Using frozen items such as frozen peas works well as the conform nicely to the are that needs to be  iced.  USE AN ACE WRAP OR TED HOSE FOR SWELLING CONTROL  In addition to icing and elevation, Ace wraps or TED hose are used to help limit and resolve swelling.  It is recommended to use Ace wraps or TED hose until you are informed to stop.    When using Ace Wraps start the wrapping distally (farthest away from the body) and wrap proximally (closer to the body)   Example: If you had surgery on your leg or thing and you do not have a splint on, start the ace wrap at the toes and work your way up to the thigh        If you had surgery on your upper extremity and do not have a splint on, start the ace wrap at your fingers and work your way up to the upper arm  IF YOU ARE IN A SPLINT OR CAST DO NOT [place]   If your splint gets wet for any reason please contact the office immediately. You may shower in your splint or cast as long as you keep it dry.  This can be done by wrapping in a cast cover or garbage back (or similar)  Do Not stick any thing down your splint or cast such as pencils, money, or hangers to try and scratch yourself with.  If you feel itchy take benadryl as prescribed on the bottle for itching  IF YOU ARE IN A CAM BOOT (BLACK BOOT)  You may remove boot periodically. Perform daily dressing changes as noted below.  Wash the liner of the boot regularly and wear a sock when wearing the boot. It is recommended that you sleep in the boot until told otherwise  CALL THE OFFICE WITH ANY QUESTIONS OR CONCERNS: [phone]

## 2018-02-25 ENCOUNTER — Encounter (HOSPITAL_COMMUNITY): Payer: Self-pay | Admitting: Orthopedic Surgery

## 2018-02-27 LAB — AEROBIC/ANAEROBIC CULTURE (SURGICAL/DEEP WOUND)
CULTURE: NO GROWTH
GRAM STAIN: NONE SEEN

## 2018-05-19 ENCOUNTER — Other Ambulatory Visit (INDEPENDENT_AMBULATORY_CARE_PROVIDER_SITE_OTHER): Payer: Self-pay | Admitting: Orthopaedic Surgery

## 2018-05-19 ENCOUNTER — Telehealth (INDEPENDENT_AMBULATORY_CARE_PROVIDER_SITE_OTHER): Payer: Self-pay | Admitting: Orthopaedic Surgery

## 2018-05-19 NOTE — Telephone Encounter (Signed)
Mailed to patient home address.

## 2018-05-19 NOTE — Telephone Encounter (Signed)
See below

## 2018-05-19 NOTE — Telephone Encounter (Signed)
She needed me to dictate a letter for her.  Please send her this letter and keep a copy of it as well.  Thanks

## 2018-05-19 NOTE — Telephone Encounter (Signed)
Patient called requesting to speak to Dr. Ninfa Linden or Artis Delay in regards to a letter, she will explain more when she speaks to them.  She did say it is in regards to Dr. Marcelino Scot.  CB#[phone].  Thank you.

## 2018-12-26 ENCOUNTER — Telehealth (INDEPENDENT_AMBULATORY_CARE_PROVIDER_SITE_OTHER): Payer: Self-pay | Admitting: Orthopaedic Surgery

## 2018-12-26 NOTE — Telephone Encounter (Signed)
Patient called this morning and would like for either Dr. Ninfa Linden or Artis Delay to give her a call back.  She did not specify what she wanted to talk to them about.  CB#[phone].  Thank you

## 2018-12-26 NOTE — Telephone Encounter (Signed)
I did speak with her.  She is having some hardware issues in terms of prominent hardware and likely needs to have things taken out now that she is healed everything.  Please get her into see [place] sometime probably next week and we can take some new x-rays and see about getting her scheduled for outpatient surgery for hardware removal.  Thanks

## 2018-12-26 NOTE — Telephone Encounter (Signed)
Can we schedule her an appt please next week with blackman or gil

## 2018-12-29 ENCOUNTER — Encounter (INDEPENDENT_AMBULATORY_CARE_PROVIDER_SITE_OTHER): Payer: Self-pay | Admitting: Physician Assistant

## 2018-12-29 ENCOUNTER — Ambulatory Visit (INDEPENDENT_AMBULATORY_CARE_PROVIDER_SITE_OTHER): Payer: BLUE CROSS/BLUE SHIELD

## 2018-12-29 ENCOUNTER — Ambulatory Visit (INDEPENDENT_AMBULATORY_CARE_PROVIDER_SITE_OTHER): Payer: BLUE CROSS/BLUE SHIELD | Admitting: Physician Assistant

## 2018-12-29 DIAGNOSIS — T8484XA Pain due to internal orthopedic prosthetic devices, implants and grafts, initial encounter: Secondary | ICD-10-CM

## 2018-12-29 DIAGNOSIS — M25572 Pain in left ankle and joints of left foot: Secondary | ICD-10-CM

## 2018-12-29 NOTE — Progress Notes (Signed)
Office Visit Note   Patient: [name] Carr           Date of Birth: 1971-02-16           MRN: [id number] Visit Date: 12/29/2018              Requested by: Wayland Salinas, MD [address] [place], [place] [id number] PCP: Wayland Salinas, MD   Assessment & Plan: Visit Diagnoses:  1. Pain in left ankle and joints of left foot   2. Painful orthopaedic hardware      Plan: Due to the fact the patient has continued pain and prominence over the hardware is almost 21 months status post surgery and is healed her fracture sites we will proceed with removal of hardware.  Distally removal of the anterior to posterior proximal tibia screw, the 2 medial distal tibial screws and the fibular hardware distally.  Risk benefits discussed with patient.  Questions encouraged and answered.  She will follow-up with [place] 2 weeks postop.  Follow-Up Instructions: Return for 2 weeks post-op.   Orders:  Orders Placed This Encounter  Procedures  . XR Ankle Complete Left   No orders of the defined types were placed in this encounter.     Procedures: No procedures performed   Clinical Data: No additional findings.   Subjective: Chief Complaint  Patient presents with  . 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  She is having rubbing of the hardware proximal leg anterior tibia screw that is prominent medial distal tibia screws and the hardware at the lateral aspect of the fibula.  She had no new injury.  She is ambulating without any assistive device. Review of Systems No fevers chills shortness breath chest pain  Objective: Vital Signs: There were no vitals taken for this visit.  Physical  Exam Constitutional:      Appearance: She is normal weight. She is not ill-appearing or diaphoretic.  Cardiovascular:     Pulses: Normal pulses.  Pulmonary:     Effort: Pulmonary effort is normal.  Neurological:     Mental Status: She is alert and oriented to person, place, and time.     Ortho Exam Left lower leg: Well-healed surgical incisions proximal and distal tibia//fibula.  Left calf supple nontender.  No abnormal warmth erythema ecchymosis about the left lower leg. Specialty Comments:  No specialty comments available.  Imaging: Xr Ankle Complete Left  Result Date: 12/29/2018 3 views left ankle: Retained hardware from IM nailing left tibia.  Left tibia fracture is well-healed.  Retained hardware left fibula with a well-healed fibular fracture.  There is no evidence of hardware failure.  No acute fractures.    PMFS History: Patient Active Problem List   Diagnosis Date Noted  . Acute blood loss anemia 02/24/2018  . 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 Anxiety   . Asthma   . Pre-diabetes   . Vitamin D deficiency 01/03/2018  . Displaced pilon fracture of left tibia, subsequent encounter for open fracture type IIIA, IIIB, or IIIC with nonunion 07/20/2017  . 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  Displaced pilon fracture of left tibia, sequela 06/23/2017  . 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 Pilon fracture of left tibia, sequela 11/03/2016  . Open fracture of tibia and fibula, left, type I or II, initial encounter 10/26/2016  . Open fracture of left tibia and fibula, type I or II, initial encounter 10/26/2016   Past Medical History:  Diagnosis Date  . Anxiety   . Asthma   . Complication of anesthesia    one time had difficulty waking up  . DVT (deep venous thrombosis) ([place])   . IUD (intrauterine device) in place    Paraguard IUD  inserted 2008  . Pneumonia    walking pneumonia  . PONV (postoperative nausea and vomiting)    'MANY MANY MANY years ago"  . Pre-diabetes    cnotrolled with diet an exercise    Family History  Problem Relation Age of Onset  . Breast cancer Mother   . Diabetes Mellitus II Father     Past Surgical History:  Procedure Laterality Date  . BREAST SURGERY     reduction  . EXTERNAL FIXATION LEG Left 10/26/2016   Procedure: EXTERNAL FIXATION ANKLE ;  Surgeon: Mcarthur Rossetti, MD;  Location: [facility];  Service: Orthopedics;  Laterality: Left;  . EXTERNAL FIXATION LEG Left 01/04/2018   Procedure: EXTERNAL FIXATION ANKLE;  Surgeon: Altamese Duck Hill, MD;  Location: [facility];  Service: Orthopedics;  Laterality: Left;  . EXTERNAL FIXATION REMOVAL Left 11/03/2016   Procedure: REMOVAL EXTERNAL FIXATION ANKLE;  Surgeon: Mcarthur Rossetti, MD;  Location: [facility] [facility];  Service: Orthopedics;  Laterality: Left;  . EXTERNAL FIXATION REMOVAL N/A 02/22/2018   Procedure: REMOVAL EXTERNAL FIXATION LEFT LEG;  Surgeon: Altamese Wataga, MD;  Location: [facility];  Service: Orthopedics;  Laterality: N/A;  . FRACTURE SURGERY     not until 10/2016  . HARDWARE REMOVAL Left 03/23/2017   Procedure: HARDWARE REMOVAL;  Surgeon: Mcarthur Rossetti, MD;  Location: [facility];  Service: Orthopedics;  Laterality: Left;  . HARDWARE REMOVAL Left 01/04/2018   Procedure: REMOVAL HARDWARE LEFT ANKLE WITH DEBRIDEMENT AND EXTERNAL FIXATION;  Surgeon: Altamese Calvert, MD;  Location: [facility];  Service: Orthopedics;  Laterality: Left;  . HARVEST BONE GRAFT Right 02/22/2018   Procedure: RIA HARVEST RIGHT FEMUR;  Surgeon: Altamese Wilberforce, MD;  Location: [facility];  Service: Orthopedics;  Laterality: Right;  . IM NAILING TIBIA Left 02/22/2018  . IRRIGATION AND DEBRIDEMENT FOOT Left 10/26/2016   Procedure: IRRIGATION AND DEBRIDEMENT;  Surgeon: Mcarthur Rossetti, MD;  Location: [facility];  Service: Orthopedics;  Laterality: Left;  . ORIF ANKLE FRACTURE Left  11/03/2016   Procedure: OPEN REDUCTION INTERNAL FIXATION (ORIF) LEFT PILON FRACTURE;  Surgeon: Mcarthur Rossetti, MD;  Location: [facility];  Service: Orthopedics;  Laterality: Left;  . ORIF ANKLE FRACTURE Left 03/23/2017   Procedure: REMOVAL OF HARDWARE LEFT ANKLE, REVISION OPEN REDUCTION INTERNAL FIXATION (ORIF) LEFT PILON FRACTURE, BONE GRAFT LEFT TIBIA;  Surgeon: Mcarthur Rossetti, MD;  Location: [facility];  Service: Orthopedics;  Laterality: Left;  . ORIF ANKLE FRACTURE Left 07/20/2017   Procedure: Bone grafting left distal tibia non-union, harvest graft from left femur;  Surgeon: Mcarthur Rossetti, MD;  Location: [facility];  Service: Orthopedics;  Laterality: Left;  . PILONIDAL CYST EXCISION    . TIBIA IM NAIL INSERTION Left 02/22/2018   Procedure: INTRAMEDULLARY (IM) NAIL LEFT TIBIAL;  Surgeon: Altamese , MD;  Location: [facility];  Service: Orthopedics;  Laterality: Left;  Social History   Occupational History  . Not on file  Tobacco Use  . Smoking status: Former Research scientist (life sciences)  . Smokeless tobacco: Never Used  Substance and Sexual Activity  . Alcohol use: Yes    Comment: social  . Drug use: No  . Sexual activity: Yes    Birth control/protection: I.U.D.

## 2018-12-30 NOTE — Progress Notes (Signed)
Need orders in epic.  Preop on 3/11

## 2018-12-30 NOTE — Progress Notes (Signed)
Please place orders in Epic as patient has a Pre-op appointment on 01/04/2019! Thank you!

## 2019-01-03 ENCOUNTER — Other Ambulatory Visit (INDEPENDENT_AMBULATORY_CARE_PROVIDER_SITE_OTHER): Payer: Self-pay | Admitting: Physician Assistant

## 2019-01-03 NOTE — Patient Instructions (Addendum)
DEN[facility] START  01/03/2019   Your procedure is scheduled on: Friday 01/06/2019  Report to [facility] Main  Entrance              Report to admitting at  1100 AM    Call this number if you have problems the morning of surgery [phone]    How to Manage Your Diabetes Before and After Surgery  Why is it important to control my blood sugar before and after surgery? . Improving blood sugar levels before and after surgery helps healing and can limit problems. . A way of improving blood sugar control is eating a healthy diet by: o  Eating less sugar and carbohydrates o  Increasing activity/exercise o  Talking with your doctor about reaching your blood sugar goals . High blood sugars (greater than 180 mg/dL) can raise your risk of infections and slow your recovery, so you will need to focus on controlling your diabetes during the weeks before surgery. . Make sure that the doctor who takes care of your diabetes knows about your planned surgery including the date and location.  How do I manage my blood sugar before surgery? . Check your blood sugar at least 4 times a day, starting 2 days before surgery, to make sure that the level is not too high or low. o Check your blood sugar the morning of your surgery when you wake up and every 2 hours until you get to the Short Stay unit. . If your blood sugar is less than 70 mg/dL, you will need to treat for low blood sugar: o Do not take insulin. o Treat a low blood sugar (less than 70 mg/dL) with  cup of clear juice (cranberry or apple), 4 glucose tablets, OR glucose gel. o Recheck blood sugar in 15 minutes after treatment (to make sure it is greater than 70 mg/dL). If your blood sugar is not greater than 70 mg/dL on recheck, call [phone] for further instructions. . Report your blood sugar to the short stay nurse when you get to Short Stay.  . If you are admitted to the hospital after surgery: o Your blood sugar  will be checked by the staff and you will probably be given insulin after surgery (instead of oral diabetes medicines) to make sure you have good blood sugar levels. o The goal for blood sugar control after surgery is 80-180 mg/dL.   WHAT DO I DO ABOUT MY DIABETES MEDICATION?        The day before surgery, Take Metformin as usual.  . Do not take oral diabetes medicines (pills) the morning of surgery.       Remember: Do not eat food  :After Midnight. May have clear liquids from midnight up until 0730 am then nothing until after surgery!    CLEAR LIQUID DIET   Foods Allowed                                                                     Foods Excluded  Coffee and tea, regular and decaf  liquids that you cannot  Plain Jell-O in any flavor                                             see through such as: Fruit ices (not with fruit pulp)                                     milk, soups, orange juice  Iced Popsicles                                    All solid food Carbonated beverages, regular and diet                                    Cranberry, grape and apple juices Sports drinks like Gatorade Lightly seasoned clear broth or consume(fat free) Sugar, honey syrup  Sample Menu Breakfast                                Lunch                                     Supper Cranberry juice                    Beef broth                            Chicken broth Jell-O                                     Grape juice                           Apple juice Coffee or tea                        Jell-O                                      Popsicle                                                Coffee or tea                        Coffee or tea  _____________________________________________________________________               BRUSH YOUR TEETH MORNING OF SURGERY AND RINSE YOUR MOUTH OUT, NO CHEWING GUM CANDY OR MINTS.     Take these medicines the morning of surgery  with A SIP OF WATER: use Proair inhaler if needed and bring inhaler with you to the hospital  DO NOT TAKE ANY DIABETIC MEDICATIONS DAY OF YOUR SURGERY!                               You may not have any metal on your body including hair pins and              piercings  Do not wear jewelry, make-up, lotions, powders or perfumes, deodorant             Do not wear nail polish.  Do not shave  48 hours prior to surgery.              Do not bring valuables to the hospital. [facility].  Contacts, dentures or bridgework may not be worn into surgery.  Leave suitcase in the car. After surgery it may be brought to your room.     Patients discharged the day of surgery will not be allowed to drive home. IF YOU ARE HAVING SURGERY AND GOING HOME THE SAME DAY, YOU MUST HAVE AN ADULT TO DRIVE YOU HOME AND BE WITH YOU FOR 24 HOURS. YOU MAY GO HOME BY TAXI OR UBER OR ORTHERWISE, BUT AN ADULT MUST ACCOMPANY YOU HOME AND STAY WITH YOU FOR 24 HOURS.  Name and phone number of your driver:spouse-Craig               Please read over the following fact sheets you were given: _____________________________________________________________________             [facility] - Preparing for Surgery Before surgery, you can play an important role.  Because skin is not sterile, your skin needs to be as free of germs as possible.  You can reduce the number of germs on your skin by washing with CHG (chlorahexidine gluconate) soap before surgery.  CHG is an antiseptic cleaner which kills germs and bonds with the skin to continue killing germs even after washing. Please DO NOT use if you have an allergy to CHG or antibacterial soaps.  If your skin becomes reddened/irritated stop using the CHG and inform your nurse when you arrive at Short Stay. Do not shave (including legs and underarms) for at least 48 hours prior to the first CHG shower.  You may shave your  face/neck. Please follow these instructions carefully:  1.  Shower with CHG Soap the night before surgery and the  morning of Surgery.  2.  If you choose to wash your hair, wash your hair first as usual with your  normal  shampoo.  3.  After you shampoo, rinse your hair and body thoroughly to remove the  shampoo.                           4.  Use CHG as you would any other liquid soap.  You can apply chg directly  to the skin and wash                       Gently with a scrungie or clean washcloth.  5.  Apply the CHG Soap to your body ONLY FROM THE NECK DOWN.   Do not use on face/ open  Wound or open sores. Avoid contact with eyes, ears mouth and genitals (private parts).                       Wash face,  Genitals (private parts) with your normal soap.             6.  Wash thoroughly, paying special attention to the area where your surgery  will be performed.  7.  Thoroughly rinse your body with warm water from the neck down.  8.  DO NOT shower/wash with your normal soap after using and rinsing off  the CHG Soap.                9.  Pat yourself dry with a clean towel.            10.  Wear clean pajamas.            11.  Place clean sheets on your bed the night of your first shower and do not  sleep with pets. Day of Surgery : Do not apply any lotions/deodorants the morning of surgery.  Please wear clean clothes to the hospital/surgery center.  FAILURE TO FOLLOW THESE INSTRUCTIONS MAY RESULT IN THE CANCELLATION OF YOUR SURGERY PATIENT SIGNATURE_________________________________  NURSE SIGNATURE__________________________________  ________________________________________________________________________

## 2019-01-04 ENCOUNTER — Encounter (HOSPITAL_COMMUNITY)
Admission: RE | Admit: 2019-01-04 | Discharge: 2019-01-04 | Disposition: A | Discharge status: Home / Self Care | Payer: BLUE CROSS/BLUE SHIELD | Source: Ambulatory Visit | Attending: Orthopaedic Surgery | Admitting: Orthopaedic Surgery

## 2019-01-04 ENCOUNTER — Encounter (HOSPITAL_COMMUNITY): Payer: Self-pay

## 2019-01-04 ENCOUNTER — Other Ambulatory Visit: Payer: Self-pay

## 2019-01-04 DIAGNOSIS — Z01818 Encounter for other preprocedural examination: Secondary | ICD-10-CM | POA: Diagnosis present

## 2019-01-04 LAB — BASIC METABOLIC PANEL
Anion gap: 11 (ref 5–15)
BUN: 13 mg/dL (ref 6–20)
CO2: 22 mmol/L (ref 22–32)
CREATININE: 0.69 mg/dL (ref 0.44–1.00)
Calcium: 9.3 mg/dL (ref 8.9–10.3)
Chloride: 104 mmol/L (ref 98–111)
GFR calc Af Amer: >60 mL/min (ref >60)
GFR calc non Af Amer: >60 mL/min (ref >60)
Glucose, Bld: 96 mg/dL (ref 70–99)
Potassium: 3.8 mmol/L (ref 3.5–5.1)
Sodium: 137 mmol/L (ref 135–145)

## 2019-01-04 LAB — CBC
HCT: 42.6 % (ref 36.0–46.0)
Hemoglobin: 13.3 g/dL (ref 12.0–15.0)
MCH: 30.1 pg (ref 26.0–34.0)
MCHC: 31.2 g/dL (ref 30.0–36.0)
MCV: 96.4 fL (ref 80.0–100.0)
Platelets: 362 10*3/uL (ref 150–400)
RBC: 4.42 MIL/uL (ref 3.87–5.11)
RDW: 13.2 % (ref 11.5–15.5)
WBC: 9.3 10*3/uL (ref 4.0–10.5)
nRBC: 0 % (ref 0.0–0.2)

## 2019-01-04 LAB — HEMOGLOBIN A1C
Hgb A1c MFr Bld: 5.3 % (ref 4.8–5.6)
MEAN PLASMA GLUCOSE: 105.41 mg/dL

## 2019-01-06 ENCOUNTER — Ambulatory Visit (HOSPITAL_COMMUNITY): Payer: BLUE CROSS/BLUE SHIELD | Admitting: Anesthesiology

## 2019-01-06 ENCOUNTER — Encounter (HOSPITAL_COMMUNITY): Payer: Self-pay | Admitting: *Deleted

## 2019-01-06 ENCOUNTER — Ambulatory Visit (HOSPITAL_COMMUNITY): Payer: BLUE CROSS/BLUE SHIELD | Admitting: Physician Assistant

## 2019-01-06 ENCOUNTER — Encounter (HOSPITAL_COMMUNITY)
Admission: RE | Disposition: A | Discharge status: Home / Self Care | Payer: Self-pay | Source: Home / Self Care | Attending: Orthopaedic Surgery

## 2019-01-06 ENCOUNTER — Ambulatory Visit (HOSPITAL_COMMUNITY)
Admission: RE | Admit: 2019-01-06 | Discharge: 2019-01-06 | Disposition: A | Discharge status: Home / Self Care | Payer: BLUE CROSS/BLUE SHIELD | Attending: Orthopaedic Surgery | Admitting: Orthopaedic Surgery

## 2019-01-06 ENCOUNTER — Ambulatory Visit (HOSPITAL_COMMUNITY): Payer: BLUE CROSS/BLUE SHIELD

## 2019-01-06 DIAGNOSIS — Y793 Surgical instruments, materials and orthopedic devices (including sutures) associated with adverse incidents: Secondary | ICD-10-CM | POA: Diagnosis not present

## 2019-01-06 DIAGNOSIS — Z79899 Other long term (current) drug therapy: Secondary | ICD-10-CM | POA: Diagnosis not present

## 2019-01-06 DIAGNOSIS — Z833 Family history of diabetes mellitus: Secondary | ICD-10-CM | POA: Insufficient documentation

## 2019-01-06 DIAGNOSIS — Z87891 Personal history of nicotine dependence: Secondary | ICD-10-CM | POA: Diagnosis not present

## 2019-01-06 DIAGNOSIS — F419 Anxiety disorder, unspecified: Secondary | ICD-10-CM | POA: Insufficient documentation

## 2019-01-06 DIAGNOSIS — T8484XA Pain due to internal orthopedic prosthetic devices, implants and grafts, initial encounter: Secondary | ICD-10-CM

## 2019-01-06 DIAGNOSIS — Z7984 Long term (current) use of oral hypoglycemic drugs: Secondary | ICD-10-CM | POA: Diagnosis not present

## 2019-01-06 DIAGNOSIS — R7303 Prediabetes: Secondary | ICD-10-CM | POA: Insufficient documentation

## 2019-01-06 DIAGNOSIS — Z86718 Personal history of other venous thrombosis and embolism: Secondary | ICD-10-CM | POA: Diagnosis not present

## 2019-01-06 DIAGNOSIS — J45909 Unspecified asthma, uncomplicated: Secondary | ICD-10-CM | POA: Diagnosis not present

## 2019-01-06 DIAGNOSIS — Z419 Encounter for procedure for purposes other than remedying health state, unspecified: Secondary | ICD-10-CM

## 2019-01-06 HISTORY — DX: Pain due to internal orthopedic prosthetic devices, implants and grafts, initial encounter: T84.84XA

## 2019-01-06 HISTORY — PX: HARDWARE REMOVAL: SHX979

## 2019-01-06 LAB — GLUCOSE, CAPILLARY: Glucose-Capillary: 98 mg/dL (ref 70–99)

## 2019-01-06 SURGERY — REMOVAL, HARDWARE
Anesthesia: General | Laterality: Left

## 2019-01-06 MED ORDER — PROPOFOL 10 MG/ML IV BOLUS
INTRAVENOUS | Status: DC | PRN
  Administered 2019-01-06: 180 mg via INTRAVENOUS

## 2019-01-06 MED ORDER — PROPOFOL 10 MG/ML IV BOLUS
INTRAVENOUS | Status: AC
  Filled 2019-01-06: qty 20

## 2019-01-06 MED ORDER — 0.9 % SODIUM CHLORIDE (POUR BTL) OPTIME
TOPICAL | Status: DC | PRN
  Administered 2019-01-06: 1000 mL

## 2019-01-06 MED ORDER — FENTANYL CITRATE (PF) 100 MCG/2ML IJ SOLN
INTRAMUSCULAR | Status: AC
  Filled 2019-01-06: qty 2

## 2019-01-06 MED ORDER — HYDROMORPHONE HCL 1 MG/ML IJ SOLN: 0.2500 mg | INTRAMUSCULAR | Status: DC | PRN

## 2019-01-06 MED ORDER — MIDAZOLAM HCL 2 MG/2ML IJ SOLN
INTRAMUSCULAR | Status: AC
  Filled 2019-01-06: qty 2

## 2019-01-06 MED ORDER — DEXAMETHASONE SODIUM PHOSPHATE 10 MG/ML IJ SOLN
INTRAMUSCULAR | Status: DC | PRN
  Administered 2019-01-06: 10 mg via INTRAVENOUS

## 2019-01-06 MED ORDER — FENTANYL CITRATE (PF) 100 MCG/2ML IJ SOLN
INTRAMUSCULAR | Status: DC | PRN
  Administered 2019-01-06 (×2): 25 ug via INTRAVENOUS
  Administered 2019-01-06: 50 ug via INTRAVENOUS

## 2019-01-06 MED ORDER — LACTATED RINGERS IV SOLN
INTRAVENOUS | Status: DC
  Administered 2019-01-06 (×2): via INTRAVENOUS

## 2019-01-06 MED ORDER — ONDANSETRON HCL 4 MG/2ML IJ SOLN
INTRAMUSCULAR | Status: DC | PRN
  Administered 2019-01-06: 4 mg via INTRAVENOUS

## 2019-01-06 MED ORDER — POVIDONE-IODINE 7.5 % EX SOLN: Freq: Once | CUTANEOUS | Status: DC

## 2019-01-06 MED ORDER — BUPIVACAINE-EPINEPHRINE (PF) 0.25% -1:200000 IJ SOLN
INTRAMUSCULAR | Status: AC
  Filled 2019-01-06: qty 30

## 2019-01-06 MED ORDER — FENTANYL CITRATE (PF) 100 MCG/2ML IJ SOLN
50.0000 ug | INTRAMUSCULAR | Status: DC
  Administered 2019-01-06: 100 ug via INTRAVENOUS

## 2019-01-06 MED ORDER — PROMETHAZINE HCL 25 MG/ML IJ SOLN: 6.2500 mg | INTRAMUSCULAR | Status: DC | PRN

## 2019-01-06 MED ORDER — KETOROLAC TROMETHAMINE 30 MG/ML IJ SOLN: 30.0000 mg | Freq: Once | INTRAMUSCULAR | Status: DC | PRN

## 2019-01-06 MED ORDER — LIDOCAINE 2% (20 MG/ML) 5 ML SYRINGE
INTRAMUSCULAR | Status: DC | PRN
  Administered 2019-01-06: 75 mg via INTRAVENOUS

## 2019-01-06 MED ORDER — BUPIVACAINE HCL (PF) 0.5 % IJ SOLN
INTRAMUSCULAR | Status: DC | PRN
  Administered 2019-01-06: 30 mL via PERINEURAL

## 2019-01-06 MED ORDER — MIDAZOLAM HCL 5 MG/5ML IJ SOLN
INTRAMUSCULAR | Status: DC | PRN
  Administered 2019-01-06: 2 mg via INTRAVENOUS

## 2019-01-06 MED ORDER — ONDANSETRON HCL 4 MG/2ML IJ SOLN
INTRAMUSCULAR | Status: AC
  Filled 2019-01-06: qty 2

## 2019-01-06 MED ORDER — HYDROCODONE-ACETAMINOPHEN 5-325 MG PO TABS: 1.0000 | ORAL_TABLET | Freq: Four times a day (QID) | ORAL | 0 refills | Status: DC | PRN

## 2019-01-06 MED ORDER — LIDOCAINE 2% (20 MG/ML) 5 ML SYRINGE
INTRAMUSCULAR | Status: AC
  Filled 2019-01-06: qty 5

## 2019-01-06 MED ORDER — CLONIDINE HCL (ANALGESIA) 100 MCG/ML EP SOLN
EPIDURAL | Status: DC | PRN
  Administered 2019-01-06: 70 ug

## 2019-01-06 MED ORDER — DEXAMETHASONE SODIUM PHOSPHATE 10 MG/ML IJ SOLN
INTRAMUSCULAR | Status: AC
  Filled 2019-01-06: qty 1

## 2019-01-06 MED ORDER — MIDAZOLAM HCL 2 MG/2ML IJ SOLN
1.0000 mg | INTRAMUSCULAR | Status: DC
  Administered 2019-01-06: 2 mg via INTRAVENOUS

## 2019-01-06 MED ORDER — CEFAZOLIN SODIUM-DEXTROSE 2-4 GM/100ML-% IV SOLN
2.0000 g | INTRAVENOUS | Status: AC
  Administered 2019-01-06: 2 g via INTRAVENOUS
  Filled 2019-01-06: qty 100

## 2019-01-06 SURGICAL SUPPLY — 40 items
BAG SPEC THK2 15X12 ZIP CLS (MISCELLANEOUS) ×1
BAG ZIPLOCK 12X15 (MISCELLANEOUS) ×2 IMPLANT
BANDAGE ACE 4X5 VEL STRL LF (GAUZE/BANDAGES/DRESSINGS) ×1 IMPLANT
BANDAGE ACE 6X5 VEL STRL LF (GAUZE/BANDAGES/DRESSINGS) ×2 IMPLANT
BANDAGE ESMARK 6X9 LF (GAUZE/BANDAGES/DRESSINGS) ×1 IMPLANT
BNDG CMPR 9X6 STRL LF SNTH (GAUZE/BANDAGES/DRESSINGS) ×1
BNDG ESMARK 6X9 LF (GAUZE/BANDAGES/DRESSINGS) ×2
COVER SURGICAL LIGHT HANDLE (MISCELLANEOUS) ×2 IMPLANT
COVER WAND RF STERILE (DRAPES) IMPLANT
CUFF TOURN SGL QUICK 34 (TOURNIQUET CUFF) ×2
CUFF TRNQT CYL 34X4.125X (TOURNIQUET CUFF) ×1 IMPLANT
DRAPE POUCH INSTRU U-SHP 10X18 (DRAPES) ×2 IMPLANT
DRAPE STERI IOBAN 125X83 (DRAPES) ×2 IMPLANT
DRAPE U-SHAPE 47X51 STRL (DRAPES) ×2 IMPLANT
DRSG PAD ABDOMINAL 8X10 ST (GAUZE/BANDAGES/DRESSINGS) ×2 IMPLANT
ELECT REM PT RETURN 15FT ADLT (MISCELLANEOUS) ×2 IMPLANT
GAUZE SPONGE 4X4 12PLY STRL (GAUZE/BANDAGES/DRESSINGS) ×2 IMPLANT
GAUZE XEROFORM 5X9 LF (GAUZE/BANDAGES/DRESSINGS) ×1 IMPLANT
GLOVE BIO SURGEON STRL SZ7.5 (GLOVE) ×2 IMPLANT
GLOVE BIOGEL PI IND STRL 8 (GLOVE) ×2 IMPLANT
GLOVE BIOGEL PI INDICATOR 8 (GLOVE) ×2
GLOVE ECLIPSE 8.0 STRL XLNG CF (GLOVE) ×2 IMPLANT
GOWN STRL REUS W/TWL XL LVL3 (GOWN DISPOSABLE) ×2 IMPLANT
KIT BASIN OR (CUSTOM PROCEDURE TRAY) ×2 IMPLANT
KIT TURNOVER KIT A (KITS) IMPLANT
NS IRRIG 1000ML POUR BTL (IV SOLUTION) ×2 IMPLANT
PACK ORTHO EXTREMITY (CUSTOM PROCEDURE TRAY) ×2 IMPLANT
PAD CAST 4YDX4 CTTN HI CHSV (CAST SUPPLIES) IMPLANT
PADDING CAST COTTON 4X4 STRL (CAST SUPPLIES) ×2
PADDING CAST COTTON 6X4 STRL (CAST SUPPLIES) ×2 IMPLANT
PROTECTOR NERVE ULNAR (MISCELLANEOUS) ×2 IMPLANT
STAPLER VISISTAT 35W (STAPLE) IMPLANT
STRIP CLOSURE SKIN 1/2X4 (GAUZE/BANDAGES/DRESSINGS) IMPLANT
SUT ETHILON 2 0 PS N (SUTURE) ×3 IMPLANT
SUT MNCRL AB 4-0 PS2 18 (SUTURE) IMPLANT
SUT VIC AB 1 CT1 27 (SUTURE) ×2
SUT VIC AB 1 CT1 27XBRD ANTBC (SUTURE) ×1 IMPLANT
SUT VIC AB 2-0 CT1 27 (SUTURE) ×4
SUT VIC AB 2-0 CT1 TAPERPNT 27 (SUTURE) ×1 IMPLANT
TOWEL OR 17X26 10 PK STRL BLUE (TOWEL DISPOSABLE) ×4 IMPLANT

## 2019-01-06 NOTE — Discharge Instructions (Signed)
Increase your activities as comfort allows. You may put all of your weight as tolerated on your left leg and ankle. No running or high impact aerobic activity until further notice. Keep your dressings clean and dry. You can remove your dressings in 5-6 days and start getting your incisions wet in the shower. New dry dressing daily starting in 5-6 days after you shower.  This can be band-aids.

## 2019-01-06 NOTE — H&P (Signed)
[name] Carr is an 48 y.o. female.   Chief Complaint:   Painful, prominent orthopedic hardware left leg/anlke HPI:   48 yo female well known to me.  Has had multiple surgeries on her left leg/ankle due to an open pilon fracture from a few years ago.  It took multiple surgeries to get the fractures to heal.  They have now healed completely.  However, the hardware at this point is quite prominent and painful.  Past Medical History:  Diagnosis Date  . Anxiety   . Asthma   . Complication of anesthesia    one time had difficulty waking up  . DVT (deep venous thrombosis) ([place]) 2019  . IUD (intrauterine device) in place    Paraguard IUD inserted 2008-removed 09/2018  . Painful orthopaedic hardware left leg and ankle ([place]) 01/06/2019  . Pneumonia    walking pneumonia  . PONV (postoperative nausea and vomiting)    'MANY MANY MANY years ago"  . Pre-diabetes    cnotrolled with diet an exercise    Past Surgical History:  Procedure Laterality Date  . BREAST SURGERY     reduction  . EXTERNAL FIXATION LEG Left 10/26/2016   Procedure: EXTERNAL FIXATION ANKLE ;  Surgeon: Mcarthur Rossetti, MD;  Location: [facility];  Service: Orthopedics;  Laterality: Left;  . EXTERNAL FIXATION LEG Left 01/04/2018   Procedure: EXTERNAL FIXATION ANKLE;  Surgeon: Altamese [place], MD;  Location: [place];  Service: Orthopedics;  Laterality: Left;  . EXTERNAL FIXATION REMOVAL Left 11/03/2016   Procedure: REMOVAL EXTERNAL FIXATION ANKLE;  Surgeon: Mcarthur Rossetti, MD;  Location: [place];  Service: Orthopedics;  Laterality: Left;  . EXTERNAL FIXATION REMOVAL N/A 02/22/2018   Procedure: REMOVAL EXTERNAL FIXATION LEFT LEG;  Surgeon: Altamese [place], MD;  Location: [place];  Service: Orthopedics;  Laterality: N/A;  . FRACTURE SURGERY  10/2016   left ankle, tibia, fibula shattered-fell off curb  . HARDWARE REMOVAL Left 03/23/2017   Procedure: HARDWARE REMOVAL;  Surgeon: Mcarthur Rossetti, MD;  Location: [place];  Service:  Orthopedics;  Laterality: Left;  . HARDWARE REMOVAL Left 01/04/2018   Procedure: REMOVAL HARDWARE LEFT ANKLE WITH DEBRIDEMENT AND EXTERNAL FIXATION;  Surgeon: Altamese [place], MD;  Location: [place];  Service: Orthopedics;  Laterality: Left;  . HARVEST BONE GRAFT Right 02/22/2018   Procedure: RIA HARVEST RIGHT FEMUR;  Surgeon: Altamese [place], MD;  Location: [place];  Service: Orthopedics;  Laterality: Right;  . IM NAILING TIBIA Left 02/22/2018  . IRRIGATION AND DEBRIDEMENT FOOT Left 10/26/2016   Procedure: IRRIGATION AND DEBRIDEMENT;  Surgeon: Mcarthur Rossetti, MD;  Location: [facility];  Service: Orthopedics;  Laterality: Left;  . IUD REMOVAL  2019  . ORIF ANKLE FRACTURE Left 11/03/2016   Procedure: OPEN REDUCTION INTERNAL FIXATION (ORIF) LEFT PILON FRACTURE;  Surgeon: Mcarthur Rossetti, MD;  Location: [place];  Service: Orthopedics;  Laterality: Left;  . ORIF ANKLE FRACTURE Left 03/23/2017   Procedure: REMOVAL OF HARDWARE LEFT ANKLE, REVISION OPEN REDUCTION INTERNAL FIXATION (ORIF) LEFT PILON FRACTURE, BONE GRAFT LEFT TIBIA;  Surgeon: Mcarthur Rossetti, MD;  Location: [place];  Service: Orthopedics;  Laterality: Left;  . ORIF ANKLE FRACTURE Left 07/20/2017   Procedure: Bone grafting left distal tibia non-union, harvest graft from left femur;  Surgeon: Mcarthur Rossetti, MD;  Location: [place];  Service: Orthopedics;  Laterality: Left;  . PILONIDAL CYST EXCISION    . TIBIA IM NAIL INSERTION Left 02/22/2018   Procedure: INTRAMEDULLARY (IM) NAIL LEFT TIBIAL;  Surgeon: Altamese Larned, MD;  Location: [place];  Service: Orthopedics;  Laterality: Left;    Family History  Problem Relation Age of Onset  . Breast cancer Mother   . Diabetes Mellitus II Father    Social History:  reports that she has quit smoking. She has never used smokeless tobacco. She reports current alcohol use. She reports that she does not use drugs.  Allergies:  Allergies  Allergen Reactions  . Morphine Shortness Of Breath     LIKELY DOSE RELATED  . Latex Hives, Itching and Rash  . Tetracycline Hives, Itching and Rash  . Chlorhexidine Itching and Rash    CHG wipes in pre-op    Medications Prior to Admission  Medication Sig Dispense Refill  . acetaminophen (TYLENOL) 325 MG tablet Take 1-2 tablets (325-650 mg total) by mouth every 6 (six) hours as needed for mild pain (pain score 1-3 or temp > 100.5).    Marland Kitchen adapalene (DIFFERIN) 0.1 % gel Apply 1 application topically daily as needed (acne).     . ALPRAZolam (XANAX) 0.5 MG tablet Take 0.5 mg by mouth 2 (two) times daily as needed for anxiety.     . cholecalciferol 5000 units TABS Take 1 tablet (5,000 Units total) by mouth daily.    . citalopram (CELEXA) 20 MG tablet Take 20 mg by mouth at bedtime.   0  . folic acid (FOLVITE) 1 MG tablet Take 1 mg by mouth daily.    . metFORMIN (GLUCOPHAGE-XR) 500 MG 24 hr tablet Take 500 mg by mouth 2 (two) times daily with a meal. For weight gain per patient    . montelukast (SINGULAIR) 10 MG tablet Take 10 mg by mouth at bedtime.    Marland Kitchen PROAIR HFA 108 (90 Base) MCG/ACT inhaler Inhale 2 puffs into the lungs every 6 (six) hours as needed for wheezing or shortness of breath.   2  . vitamin C (VITAMIN C) 500 MG tablet Take 1 tablet (500 mg total) by mouth daily. 30 tablet 2  . calcium citrate (CALCITRATE - DOSED IN MG ELEMENTAL CALCIUM) 950 MG tablet Take 1 tablet (200 mg of elemental calcium total) by mouth 2 (two) times daily. (Patient not taking: Reported on 01/02/2019) 60 tablet 2  . cyclobenzaprine (FLEXERIL) 5 MG tablet Take 1-2 tablets (5-10 mg total) by mouth 3 (three) times daily as needed for muscle spasms. (Patient not taking: Reported on 01/02/2019) 30 tablet 0  . docusate sodium (COLACE) 100 MG capsule Take 1 capsule (100 mg total) by mouth 2 (two) times daily. (Patient not taking: Reported on 01/02/2019) 30 capsule 0  . enoxaparin (LOVENOX) 40 MG/0.4ML injection Inject 0.4 mLs (40 mg total) into the skin daily. (Patient not  taking: Reported on 01/02/2019) 14 Syringe 0  . ferrous sulfate 325 (65 FE) MG EC tablet Take 1 tablet (325 mg total) by mouth 3 (three) times daily with meals. (Patient not taking: Reported on 01/02/2019) 90 tablet 3  . oxyCODONE (OXY IR/ROXICODONE) 5 MG immediate release tablet Take 1-2 tablets (5-10 mg total) by mouth every 6 (six) hours as needed for breakthrough pain (for breakthrough pain only. use between percocet doses if needed). (Patient not taking: Reported on 02/10/2018) 60 tablet 0  . oxyCODONE-acetaminophen (PERCOCET) 7.5-325 MG tablet Take 1-2 tablets by mouth every 6 (six) hours as needed for severe pain. (Patient not taking: Reported on 01/02/2019) 50 tablet 0    Results for orders placed or performed during the hospital encounter of 01/06/19 (from the past  48 hour(s))  Glucose, capillary     Status: None   Collection Time: 01/06/19 11:27 AM  Result Value Ref Range   Glucose-Capillary 98 70 - 99 mg/dL   No results found.  Review of Systems  All other systems reviewed and are negative.   Blood pressure (!) 141/93, pulse 77, temperature 98.8 F (37.1 C), temperature source Oral, resp. rate 16, last menstrual period 01/03/2019, SpO2 99 %. Physical Exam  Constitutional: She is oriented to person, place, and time. She appears well-developed and well-nourished.  HENT:  Head: Normocephalic and atraumatic.  Eyes: EOM are normal.  Neck: Normal range of motion. Neck supple.  Cardiovascular: Normal rate and regular rhythm.  Respiratory: Effort normal and breath sounds normal.  GI: Soft. Bowel sounds are normal.  Musculoskeletal:       Legs:  Neurological: She is alert and oriented to person, place, and time.  Skin: Skin is warm and dry.  Psychiatric: She has a normal mood and affect.     Assessment/Plan Painful, prominent orthopedic hardware left leg and ankle  To the OR today as an outpatient for removal of her hardware from her left leg/ankle.  Risks and benefits have been  discussed in detail.  Mcarthur Rossetti, MD 01/06/2019, 12:52 PM

## 2019-01-06 NOTE — Anesthesia Procedure Notes (Signed)
Procedure Name: LMA Insertion Date/Time: 01/06/2019 2:16 PM Performed by: Netanel Yannuzzi D, CRNA Pre-anesthesia Checklist: Patient identified, Emergency Drugs available, Suction available and Patient being monitored Patient Re-evaluated:Patient Re-evaluated prior to induction Oxygen Delivery Method: Circle system utilized Preoxygenation: Pre-oxygenation with 100% oxygen Induction Type: IV induction Ventilation: Mask ventilation without difficulty LMA Size: 4.0 Tube type: Oral Number of attempts: 1 Airway Equipment and Method: Stylet Placement Confirmation: positive ETCO2 and breath sounds checked- equal and bilateral Tube secured with: Tape Dental Injury: Teeth and Oropharynx as per pre-operative assessment

## 2019-01-06 NOTE — Transfer of Care (Signed)
Immediate Anesthesia Transfer of Care Note  Patient: [name] Carr  Procedure(s) Performed: HARDWARE REMOVAL LEFT LOWER LEG (Left )  Patient Location: PACU  Anesthesia Type:General and Regional  Level of Consciousness: awake and alert   Airway & Oxygen Therapy: Patient Spontanous Breathing and Patient connected to face mask oxygen  Post-op Assessment: Report given to RN and Post -op Vital signs reviewed and stable  Post vital signs: Reviewed and stable  Last Vitals:  Vitals Value Taken Time  BP 115/73 01/06/2019  3:38 PM  Temp    Pulse 93 01/06/2019  3:40 PM  Resp 19 01/06/2019  3:40 PM  SpO2 100 % 01/06/2019  3:40 PM  Vitals shown include unvalidated device data.  Last Pain:  Vitals:   01/06/19 1356  TempSrc: Oral  PainSc: 3       Patients Stated Pain Goal: 4 (54/98/26 4158)  Complications: No apparent anesthesia complications

## 2019-01-06 NOTE — Anesthesia Preprocedure Evaluation (Addendum)
Anesthesia Evaluation  Patient identified by MRN, date of birth, ID band Patient awake    Reviewed: Allergy & Precautions, NPO status , Patient's Chart, lab work & pertinent test results  History of Anesthesia Complications (+) PONV  Airway Mallampati: II  TM Distance: >3 FB Neck ROM: Full    Dental no notable dental hx.    Pulmonary asthma , former smoker,    Pulmonary exam normal breath sounds clear to auscultation       Cardiovascular negative cardio ROS Normal cardiovascular exam Rhythm:Regular Rate:Normal     Neuro/Psych Anxiety negative neurological ROS     GI/Hepatic negative GI ROS, Neg liver ROS,   Endo/Other  negative endocrine ROS  Renal/GU negative Renal ROS  negative genitourinary   Musculoskeletal negative musculoskeletal ROS (+)   Abdominal   Peds negative pediatric ROS (+)  Hematology negative hematology ROS (+)   Anesthesia Other Findings   Reproductive/Obstetrics negative OB ROS                            Anesthesia Physical Anesthesia Plan  ASA: II  Anesthesia Plan: General   Post-op Pain Management:  Regional for Post-op pain   Induction: Intravenous  PONV Risk Score and Plan: Ondansetron, Dexamethasone, Treatment may vary due to age or medical condition and Midazolam  Airway Management Planned: LMA  Additional Equipment:   Intra-op Plan:   Post-operative Plan: Extubation in OR  Informed Consent: I have reviewed the patients History and Physical, chart, labs and discussed the procedure including the risks, benefits and alternatives for the proposed anesthesia with the patient or authorized representative who has indicated his/her understanding and acceptance.     Dental advisory given  Plan Discussed with: CRNA and Surgeon  Anesthesia Plan Comments:         Anesthesia Quick Evaluation

## 2019-01-06 NOTE — Anesthesia Procedure Notes (Signed)
Anesthesia Regional Block: Popliteal block   Pre-Anesthetic Checklist: ,, timeout performed, Correct Patient, Correct Site, Correct Laterality, Correct Procedure, Correct Position, site marked, Risks and benefits discussed,  Surgical consent,  Pre-op evaluation,  At surgeon's request and post-op pain management  Laterality: Left  Prep: chloraprep       Needles:  Injection technique: Single-shot  Needle Type: Echogenic Needle     Needle Length: 9cm      Additional Needles:   Procedures:,,,, ultrasound used (permanent image in chart),,,,  Narrative:  Start time: 01/06/2019 1:54 PM End time: 01/06/2019 2:01 PM Injection made incrementally with aspirations every 5 mL.  Performed by: Personally  Anesthesiologist: Myrtie Soman, MD  Additional Notes: Patient tolerated the procedure well without complications

## 2019-01-06 NOTE — Anesthesia Procedure Notes (Signed)
Anesthesia Procedure Image    

## 2019-01-06 NOTE — Progress Notes (Signed)
Assisted Dr. Rose with left, ultrasound guided, popliteal block. Side rails up, monitors on throughout procedure. See vital signs in flow sheet. Tolerated Procedure well. 

## 2019-01-07 NOTE — Op Note (Signed)
NAME: [name], Carr MEDICAL RECORD [id number] ACCOUNT [id number] DATE OF BIRTH:03-11-1971 FACILITY: [facility] LOCATION: [facility] PHYSICIAN:Kimmie Berggren [name], MD  OPERATIVE REPORT  DATE OF PROCEDURE:  01/06/2019  PREOPERATIVE DIAGNOSIS:  Painful prominent hardware, left leg and ankle.  POSTOPERATIVE DIAGNOSIS:  Painful prominent hardware, left leg and ankle.  PROCEDURE:  Removal of prominent hardware, left leg and left ankle.  SURGEON:  Lind Guest. Ninfa Linden, MD  ASSISTANT:  Erskine Emery, PA-C  ANESTHESIA: 1.  Left lower extremity popliteal block. 2.  General.  ESTIMATED BLOOD LOSS:  Minimal.  TOURNIQUET TIME:  30 minutes.  COMPLICATIONS:  None.  INDICATIONS:  The patient is a 48 year old well known to me.  She sustained an open pilon fracture of her left lower extremity several years ago after an accident.  It took multiple surgeries to finally get her to heal the bone, and she was finally able  to do that.  At this point, the bone is healed appropriately, but she does have problems with pain from prominence of 3 of the interlocking screws of her tibial nail and the lateral ankle plate.  At this point, given the fact the fracture is healed  completely, we talked about hardware removal.  She understands fully the risk of infection and bleeding having surgery such as this and the need to avoid high-impact aerobic activities for at least 4-6 weeks once we have the hardware out to allow  adequate healing.  She can weightbear as tolerated after this.  I talked to her about other instructions as it relates to hardware removal and how difficult this procedure can be.  DESCRIPTION OF PROCEDURE:  After informed consent was obtained and appropriate left leg was marked, anesthesia obtained a popliteal block in the holding room.  She was then brought to the operating room and placed supine on the operating table.  General  anesthesia was then obtained.  A nonsterile tourniquet  was placed around her upper left thigh and her left thigh, knee, leg, ankle and foot were prepped and draped with Betadine and sterile drapes.  A time-out was called, and she was identified as  correct patient, correct left leg.  I then used an Esmarch to wrap that leg, and tourniquet was inflated to 300 mm of pressure.  We then made 3 separate stab incisions along the tibia with 1 proximal and 2 anteromedial.  We removed the 3 prominent  interlocking screws from the tibial nail and tibia without difficulty.  We then made 2 separate lateral incisions over the fibula, 1 distal and 1 proximal.  We were able to remove the screws from the fibular plate distally and proximally and then removed  the plate in its entirety without any difficulty.  We then stressed the ankle and it was intact.  We then irrigated all wounds with normal saline solution.  The lateral wound of the fibula we closed with 2-0 Vicryl suture in the subcutaneous tissue with  interrupted 2-0 nylon in the skin.  All the smaller incisions were closed with 2-0 nylon as well.  A Xeroform well-padded sterile dressing was applied.  The tourniquet was let down, and her toes pinkened nicely.  She was taken to recovery room in stable  condition.  All final counts were correct.  There were no complications noted.  LN/NUANCE  D:01/06/2019 T:01/07/2019 JOB:[id number]

## 2019-01-08 NOTE — Anesthesia Postprocedure Evaluation (Signed)
Anesthesia Post Note  Patient: [name] Carr  Procedure(s) Performed: HARDWARE REMOVAL LEFT LOWER LEG (Left )     Patient location during evaluation: PACU Anesthesia Type: General Level of consciousness: awake and alert Pain management: pain level controlled Vital Signs Assessment: post-procedure vital signs reviewed and stable Respiratory status: spontaneous breathing, nonlabored ventilation, respiratory function stable and patient connected to nasal cannula oxygen Cardiovascular status: blood pressure returned to baseline and stable Postop Assessment: no apparent nausea or vomiting Anesthetic complications: no    Last Vitals:  Vitals:   01/06/19 1600 01/06/19 1615  BP: 125/79 127/85  Pulse: 84 79  Resp: 17 18  Temp: 36.6 C 36.5 C  SpO2: 95% 95%    Last Pain:  Vitals:   01/06/19 1615  TempSrc:   PainSc: 0-No pain                 Samuel Mcpeek

## 2019-01-11 ENCOUNTER — Encounter (HOSPITAL_COMMUNITY): Payer: Self-pay | Admitting: Orthopaedic Surgery

## 2019-01-17 ENCOUNTER — Telehealth (INDEPENDENT_AMBULATORY_CARE_PROVIDER_SITE_OTHER): Payer: Self-pay | Admitting: Radiology

## 2019-01-17 NOTE — Telephone Encounter (Signed)
I called patient to r/s appt from415 01/19/2019, she states that is healing well, walking and doing well.  She states that her stitches were bothering her and so someone that she works with took out her sutures.  She would like to know if she can be r/s till after this COVID-19 scare is over.  Please call her to r/s the appt----I did leave the 01/19/2019 appt in the system so it can be r/s easier.

## 2019-01-18 ENCOUNTER — Telehealth (INDEPENDENT_AMBULATORY_CARE_PROVIDER_SITE_OTHER): Payer: Self-pay

## 2019-01-18 ENCOUNTER — Encounter (INDEPENDENT_AMBULATORY_CARE_PROVIDER_SITE_OTHER): Payer: Self-pay

## 2019-01-18 NOTE — Telephone Encounter (Signed)
Is this ok?

## 2019-01-18 NOTE — Telephone Encounter (Signed)
Okay to reschedule this patient for a later date.  I know her well.

## 2019-01-19 ENCOUNTER — Inpatient Hospital Stay (INDEPENDENT_AMBULATORY_CARE_PROVIDER_SITE_OTHER): Payer: BLUE CROSS/BLUE SHIELD | Admitting: Orthopaedic Surgery

## 2019-01-19 ENCOUNTER — Encounter (INDEPENDENT_AMBULATORY_CARE_PROVIDER_SITE_OTHER): Payer: Self-pay

## 2019-02-21 ENCOUNTER — Encounter (INDEPENDENT_AMBULATORY_CARE_PROVIDER_SITE_OTHER): Payer: Self-pay | Admitting: Orthopaedic Surgery

## 2019-02-21 ENCOUNTER — Ambulatory Visit (INDEPENDENT_AMBULATORY_CARE_PROVIDER_SITE_OTHER): Payer: BLUE CROSS/BLUE SHIELD | Admitting: Orthopaedic Surgery

## 2019-02-21 ENCOUNTER — Other Ambulatory Visit: Payer: Self-pay

## 2019-02-21 DIAGNOSIS — T8484XA Pain due to internal orthopedic prosthetic devices, implants and grafts, initial encounter: Secondary | ICD-10-CM

## 2019-02-21 NOTE — Progress Notes (Signed)
[name] Carr is now 6 weeks status post removal of a prominent plate and screws from her left fibula as well as prominent locking screws from a tibial nail of her left tibia.  She is doing well overall.  She says she has a limp.  She is been walking about 4 flights of steps multiple times daily.  She had a complex injury that was treated with multiple surgeries over several years.  She is very satisfied at this point.  On exam there is no evidence of hardware prominence at this point on her left ankle.  She has good range of motion overall.  There is some slight swelling but no malalignment that I can tell and good range of motion of her knee and ankle.  This point she is slowly increase her activities as comfort allows and listen to her body.  If there is any issues whatsoever at all she knows to come see [place].

## 2019-03-14 ENCOUNTER — Other Ambulatory Visit: Payer: Self-pay | Admitting: Plastic Surgery

## 2020-10-26 HISTORY — PX: OTHER SURGICAL HISTORY: SHX169

## 2022-09-02 ENCOUNTER — Encounter: Payer: Self-pay | Admitting: Internal Medicine

## 2022-09-03 ENCOUNTER — Ambulatory Visit (AMBULATORY_SURGERY_CENTER): Payer: BC Managed Care – PPO

## 2022-09-03 VITALS — Ht 66.0 in | Wt 183.0 lb

## 2022-09-03 DIAGNOSIS — Z1211 Encounter for screening for malignant neoplasm of colon: Secondary | ICD-10-CM

## 2022-09-03 MED ORDER — NA SULFATE-K SULFATE-MG SULF 17.5-3.13-1.6 GM/177ML PO SOLN: 1.0000 | Freq: Once | ORAL | 0 refills | Status: AC

## 2022-09-03 NOTE — Progress Notes (Signed)

## 2022-09-09 ENCOUNTER — Encounter: Payer: Self-pay | Admitting: Internal Medicine

## 2022-09-16 ENCOUNTER — Encounter: Payer: Self-pay | Admitting: Internal Medicine

## 2022-09-16 ENCOUNTER — Ambulatory Visit (AMBULATORY_SURGERY_CENTER): Payer: BC Managed Care – PPO | Admitting: Internal Medicine

## 2022-09-16 VITALS — BP 119/72 | HR 60 | Temp 98.0°F | Resp 13 | Ht 65.0 in | Wt 183.0 lb

## 2022-09-16 DIAGNOSIS — Z1211 Encounter for screening for malignant neoplasm of colon: Secondary | ICD-10-CM

## 2022-09-16 DIAGNOSIS — D120 Benign neoplasm of cecum: Secondary | ICD-10-CM

## 2022-09-16 DIAGNOSIS — D122 Benign neoplasm of ascending colon: Secondary | ICD-10-CM | POA: Diagnosis not present

## 2022-09-16 MED ORDER — SODIUM CHLORIDE 0.9 % IV SOLN: 500.0000 mL | Freq: Once | INTRAVENOUS | Status: DC

## 2022-09-16 NOTE — Progress Notes (Signed)
GASTROENTEROLOGY PROCEDURE H&P NOTE   Primary Care Physician: Wayland Salinas, MD    Reason for Procedure:   Colon cancer screening  Plan:    Colonoscopy  Patient is appropriate for endoscopic procedure(s) in the ambulatory ([place]) setting.  The nature of the procedure, as well as the risks, benefits, and alternatives were carefully and thoroughly reviewed with the patient. Ample time for discussion and questions allowed. The patient understood, was satisfied, and agreed to proceed.     HPI: [name] Carr is a 51 y.o. female who presents for colonoscopy for colon cancer screening. Denies blood in stools, changes in bowel habits, or unintentional weight loss. Denies family history of colon cancer.  Past Medical History:  Diagnosis Date   Anxiety    Asthma    Complication of anesthesia    one time had difficulty waking up   DVT (deep venous thrombosis) ([place]) 2019   IUD (intrauterine device) in place    Paraguard IUD inserted 2008-removed 09/2018   Painful orthopaedic hardware left leg and ankle ([place]) 01/06/2019   Pneumonia    walking pneumonia   PONV (postoperative nausea and vomiting)    'MANY MANY MANY years ago"   Pre-diabetes    cnotrolled with diet an exercise    Past Surgical History:  Procedure Laterality Date   BREAST SURGERY     reduction   EXTERNAL FIXATION LEG Left 10/26/2016   Procedure: EXTERNAL FIXATION ANKLE ;  Surgeon: Mcarthur Rossetti, MD;  Location: [facility];  Service: Orthopedics;  Laterality: Left;   EXTERNAL FIXATION LEG Left 01/04/2018   Procedure: EXTERNAL FIXATION ANKLE;  Surgeon: Altamese [place]ville, MD;  Location: [facility];  Service: Orthopedics;  Laterality: Left;   EXTERNAL FIXATION REMOVAL Left 11/03/2016   Procedure: REMOVAL EXTERNAL FIXATION ANKLE;  Surgeon: Mcarthur Rossetti, MD;  Location: [facility];  Service: Orthopedics;  Laterality: Left;   EXTERNAL FIXATION REMOVAL N/A 02/22/2018   Procedure: REMOVAL EXTERNAL FIXATION LEFT  LEG;  Surgeon: Altamese Choct[place], MD;  Location: [facility];  Service: Orthopedics;  Laterality: N/A;   FRACTURE SURGERY  10/2016   left ankle, tibia, fibula shattered-fell off curb   HARDWARE REMOVAL Left 03/23/2017   Procedure: HARDWARE REMOVAL;  Surgeon: Mcarthur Rossetti, MD;  Location: [facility];  Service: Orthopedics;  Laterality: Left;   HARDWARE REMOVAL Left 01/04/2018   Procedure: REMOVAL HARDWARE LEFT ANKLE WITH DEBRIDEMENT AND EXTERNAL FIXATION;  Surgeon: Altamese Woodlynne, MD;  Location: [facility];  Service: Orthopedics;  Laterality: Left;   HARDWARE REMOVAL Left 01/06/2019   Procedure: HARDWARE REMOVAL LEFT LOWER LEG;  Surgeon: Mcarthur Rossetti, MD;  Location: [facility];  Service: Orthopedics;  Laterality: Left;   HARVEST BONE GRAFT Right 02/22/2018   Procedure: RIA HARVEST RIGHT FEMUR;  Surgeon: Altamese , MD;  Location: [facility];  Service: Orthopedics;  Laterality: Right;   IM NAILING TIBIA Left 02/22/2018   IRRIGATION AND DEBRIDEMENT FOOT Left 10/26/2016   Procedure: IRRIGATION AND DEBRIDEMENT;  Surgeon: Mcarthur Rossetti, MD;  Location: [facility];  Service: Orthopedics;  Laterality: Left;   IUD REMOVAL  2019   ORIF ANKLE FRACTURE Left 11/03/2016   Procedure: OPEN REDUCTION INTERNAL FIXATION (ORIF) LEFT PILON FRACTURE;  Surgeon: Mcarthur Rossetti, MD;  Location: [facility];  Service: Orthopedics;  Laterality: Left;   ORIF ANKLE FRACTURE Left 03/23/2017   Procedure: REMOVAL OF HARDWARE LEFT ANKLE, REVISION OPEN REDUCTION INTERNAL FIXATION (ORIF) LEFT PILON FRACTURE, BONE GRAFT LEFT TIBIA;  Surgeon: Mcarthur Rossetti, MD;  Location: [facility];  Service: Orthopedics;  Laterality: Left;   ORIF ANKLE FRACTURE Left 07/20/2017   Procedure: Bone grafting left distal tibia non-union, harvest graft from left femur;  Surgeon: Mcarthur Rossetti, MD;  Location: [facility];  Service: Orthopedics;  Laterality: Left;   PILONIDAL CYST EXCISION     TIBIA IM NAIL INSERTION Left 02/22/2018    Procedure: INTRAMEDULLARY (IM) NAIL LEFT TIBIAL;  Surgeon: Altamese Betances, MD;  Location: [facility];  Service: Orthopedics;  Laterality: Left;   Tummy tuck  2022    Prior to Admission medications   Medication Sig Start Date End Date Taking? Authorizing Provider  adapalene (DIFFERIN) 0.1 % gel Apply 1 application topically daily as needed (acne).  05/11/13  Yes [provider]  ALPRAZolam Duanne Moron) 0.5 MG tablet Take 0.5 mg by mouth 2 (two) times daily as needed for anxiety.    Yes [provider]  Cholecalciferol (VITAMIN D-1000 MAX ST) 25 MCG (1000 UT) tablet Take by mouth.   Yes [provider]  citalopram (CELEXA) 20 MG tablet Take 20 mg by mouth at bedtime.  01/24/18  Yes [provider]  folic acid (FOLVITE) 1 MG tablet Take 1 mg by mouth daily. 11/29/18  Yes [provider]  montelukast (SINGULAIR) 10 MG tablet Take 10 mg by mouth at bedtime.   Yes [provider]  vitamin C (VITAMIN C) 500 MG tablet Take 1 tablet (500 mg total) by mouth daily. 01/08/18  Yes Ainsley Spinner, PA-C  Eszopiclone 3 MG TABS Take by mouth. 08/27/22 08/27/23  [provider]  PROAIR HFA 108 (90 Base) MCG/ACT inhaler Inhale 2 puffs into the lungs every 6 (six) hours as needed for wheezing or shortness of breath.  Patient not taking: Reported on 09/03/2022 03/08/17   [provider]    Current Outpatient Medications  Medication Sig Dispense Refill   adapalene (DIFFERIN) 0.1 % gel Apply 1 application topically daily as needed (acne).      ALPRAZolam (XANAX) 0.5 MG tablet Take 0.5 mg by mouth 2 (two) times daily as needed for anxiety.      Cholecalciferol (VITAMIN D-1000 MAX ST) 25 MCG (1000 UT) tablet Take by mouth.     citalopram (CELEXA) 20 MG tablet Take 20 mg by mouth at bedtime.   0   folic acid (FOLVITE) 1 MG tablet Take 1 mg by mouth daily.     montelukast (SINGULAIR) 10 MG tablet Take 10 mg by mouth at bedtime.     vitamin C (VITAMIN C) 500 MG tablet  Take 1 tablet (500 mg total) by mouth daily. 30 tablet 2   Eszopiclone 3 MG TABS Take by mouth.     PROAIR HFA 108 (90 Base) MCG/ACT inhaler Inhale 2 puffs into the lungs every 6 (six) hours as needed for wheezing or shortness of breath.  (Patient not taking: Reported on 09/03/2022)  2   Current Facility-Administered Medications  Medication Dose Route Frequency Provider Last Rate Last Admin   0.9 %  sodium chloride infusion  500 mL Intravenous Once Sharyn Creamer, MD        Allergies as of 09/16/2022 - Review Complete 09/16/2022  Allergen Reaction Noted   Morphine Shortness Of Breath 03/08/2017   Latex Hives, Itching, and Rash 03/24/2012   Tetracycline Hives, Itching, and Rash 03/24/2012   Chlorhexidine Itching and Rash 03/23/2017    Family History  Problem Relation Age of Onset   Breast cancer Mother    Colon polyps Father  Diabetes Mellitus II Father    Colon cancer Neg Hx    Esophageal cancer Neg Hx    Rectal cancer Neg Hx    Stomach cancer Neg Hx     Social History   Socioeconomic History   Marital status: Married    Spouse name: Not on file   Number of children: Not on file   Years of education: Not on file   Highest education level: Not on file  Occupational History   Not on file  Tobacco Use   Smoking status: Former   Smokeless tobacco: Never  Vaping Use   Vaping Use: Never used  Substance and Sexual Activity   Alcohol use: Yes    Comment: social   Drug use: No   Sexual activity: Yes    Birth control/protection: I.U.D.  Other Topics Concern   Not on file  Social History Narrative   Not on file   Social Determinants of Health   Financial Resource Strain: Not on file  Food Insecurity: Not on file  Transportation Needs: Not on file  Physical Activity: Not on file  Stress: Not on file  Social Connections: Not on file  Intimate Partner Violence: Not on file    Physical Exam: Vital signs in last 24 hours: BP (!) 138/96   Pulse 69   Temp 98 F  (36.7 C)   Resp (!) 9   Ht '5\' 5"'$  (1.651 m)   Wt 183 lb (83 kg)   SpO2 99%   BMI 30.45 kg/m  GEN: NAD EYE: Sclerae anicteric ENT: MMM CV: Non-tachycardic Pulm: No increased work of breathing GI: Soft, NT/ND NEURO:  Alert & Oriented   Christia Reading, MD [facility]  09/16/2022 8:14 AM

## 2022-09-16 NOTE — Progress Notes (Signed)
Called to room to assist during endoscopic procedure.  Patient ID and intended procedure confirmed with present staff. Received instructions for my participation in the procedure from the performing physician.  

## 2022-09-16 NOTE — Progress Notes (Signed)
A and O x3. Report to RN. Tolerated MAC anesthesia well. 

## 2022-09-16 NOTE — Progress Notes (Signed)
Pt's states no medical or surgical changes since previsit or office visit. 

## 2022-09-16 NOTE — Op Note (Signed)
[facility] Patient Name: [name] Carr Procedure Date: 09/16/2022 7:58 AM MRN: [id number] Endoscopist: Georgian Co , , [id number] Age: 51 Referring MD:  Date of Birth: 07/28/1971 Gender: Female Account #: [id number] Procedure:                Colonoscopy Indications:              Screening for colorectal malignant neoplasm, This                            is the patient's first colonoscopy Medicines:                Monitored Anesthesia Care Procedure:                Pre-Anesthesia Assessment:                           - Prior to the procedure, a History and Physical                            was performed, and patient medications and                            allergies were reviewed. The patient's tolerance of                            previous anesthesia was also reviewed. The risks                            and benefits of the procedure and the sedation                            options and risks were discussed with the patient.                            All questions were answered, and informed consent                            was obtained. Prior Anticoagulants: The patient has                            taken no anticoagulant or antiplatelet agents. ASA                            Grade Assessment: II - A patient with mild systemic                            disease. After reviewing the risks and benefits,                            the patient was deemed in satisfactory condition to                            undergo the procedure.  After obtaining informed consent, the colonoscope                            was passed under direct vision. Throughout the                            procedure, the patient's blood pressure, pulse, and                            oxygen saturations were monitored continuously. The                            Colonoscope was introduced through the anus and                            advanced to the  the terminal ileum. The colonoscopy                            was performed without difficulty. The patient                            tolerated the procedure well. The quality of the                            bowel preparation was good. The terminal ileum,                            ileocecal valve, appendiceal orifice, and rectum                            were photographed. Scope In: 8:15:14 AM Scope Out: 8:47:16 AM Scope Withdrawal Time: 0 hours 27 minutes 23 seconds  Total Procedure Duration: 0 hours 32 minutes 2 seconds  Findings:                 The terminal ileum appeared normal.                           A 3 mm polyp was found in the cecum. The polyp was                            sessile. The polyp was removed with a cold snare.                            Resection and retrieval were complete.                           Four pedunculated polyps were found in the                            ascending colon. The polyps were 8 to 14 mm in                            size. These polyps were removed with  a hot snare.                            Resection and retrieval were complete.                           Non-bleeding internal hemorrhoids were found during                            retroflexion. Complications:            No immediate complications. Estimated Blood Loss:     Estimated blood loss was minimal. Impression:               - The examined portion of the ileum was normal.                           - One 3 mm polyp in the cecum, removed with a cold                            snare. Resected and retrieved.                           - Four 8 to 14 mm polyps in the ascending colon,                            removed with a hot snare. Resected and retrieved.                           - Non-bleeding internal hemorrhoids. Recommendation:           - Discharge patient to home (with escort).                           - Await pathology results.                           - The  findings and recommendations were discussed                            with the patient. Dr Georgian Co "Lyndee Leo" Lorenso Courier,  09/16/2022 8:51:30 AM

## 2022-09-16 NOTE — Patient Instructions (Signed)
Read all of the handouts given to you by your recovery room nurse.  Resume all of your previous medicines as prescribed.  YOU HAD AN ENDOSCOPIC PROCEDURE TODAY AT [facility]:   Refer to the procedure report that was given to you for any specific questions about what was found during the examination.  If the procedure report does not answer your questions, please call your gastroenterologist to clarify.  If you requested that your care partner not be given the details of your procedure findings, then the procedure report has been included in a sealed envelope for you to review at your convenience later.  YOU SHOULD EXPECT: Some feelings of bloating in the abdomen. Passage of more gas than usual.  Walking can help get rid of the air that was put into your GI tract during the procedure and reduce the bloating. If you had a lower endoscopy (such as a colonoscopy or flexible sigmoidoscopy) you may notice spotting of blood in your stool or on the toilet paper. If you underwent a bowel prep for your procedure, you may not have a normal bowel movement for a few days.  Please Note:  You might notice some irritation and congestion in your nose or some drainage.  This is from the oxygen used during your procedure.  There is no need for concern and it should clear up in a day or so.  SYMPTOMS TO REPORT IMMEDIATELY:  Following lower endoscopy (colonoscopy or flexible sigmoidoscopy):  Excessive amounts of blood in the stool  Significant tenderness or worsening of abdominal pains  Swelling of the abdomen that is new, acute  Fever of 100F or higher   For urgent or emergent issues, a gastroenterologist can be reached at any hour by calling [phone]. Do not use MyChart messaging for urgent concerns.    DIET:  We do recommend a small meal at first, but then you may proceed to your regular diet.  Drink plenty of fluids but you should avoid alcoholic beverages for 24 hours.  ACTIVITY:  You  should plan to take it easy for the rest of today and you should NOT DRIVE or use heavy machinery until tomorrow (because of the sedation medicines used during the test).    FOLLOW UP: Our staff will call the number listed on your records the next business day following your procedure.  We will call around 7:15- 8:00 am to check on you and address any questions or concerns that you may have regarding the information given to you following your procedure. If we do not reach you, we will leave a message.     If any biopsies were taken you will be contacted by phone or by letter within the next 1-3 weeks.  Please call us at [phone] if you have not heard about the biopsies in 3 weeks.    SIGNATURES/CONFIDENTIALITY: You and/or your care partner have signed paperwork which will be entered into your electronic medical record.  These signatures attest to the fact that that the information above on your After Visit Summary has been reviewed and is understood.  Full responsibility of the confidentiality of this discharge information lies with you and/or your care-partner.

## 2022-09-21 ENCOUNTER — Telehealth: Payer: Self-pay

## 2022-09-21 NOTE — Telephone Encounter (Signed)
  Follow up Call-     09/16/2022    7:12 AM  Call back number  Post procedure Call Back phone  # [phone]  Permission to leave phone message Yes     Patient questions:  Do you have a fever, pain , or abdominal swelling? No. Pain Score  0 *  Have you tolerated food without any problems? Yes.    Have you been able to return to your normal activities? Yes.    Do you have any questions about your discharge instructions: Diet   No. Medications  No. Follow up visit  No.  Do you have questions or concerns about your Care? No.  Actions: * If pain score is 4 or above: No action needed, pain <4.

## 2022-09-23 ENCOUNTER — Encounter: Payer: Self-pay | Admitting: Internal Medicine
